# Patient Record
Sex: Female | Born: 1970 | Race: White | Hispanic: No | Marital: Married | Smoking: Former smoker
Health system: Southern US, Community
[De-identification: ages and names within clinical notes are randomized; demographics above are authoritative.]

## PROBLEM LIST (undated history)

## (undated) DIAGNOSIS — F32A Depression, unspecified: Secondary | ICD-10-CM

## (undated) DIAGNOSIS — J449 Chronic obstructive pulmonary disease, unspecified: Secondary | ICD-10-CM

## (undated) DIAGNOSIS — K219 Gastro-esophageal reflux disease without esophagitis: Secondary | ICD-10-CM

## (undated) DIAGNOSIS — J45909 Unspecified asthma, uncomplicated: Secondary | ICD-10-CM

## (undated) DIAGNOSIS — F319 Bipolar disorder, unspecified: Secondary | ICD-10-CM

## (undated) DIAGNOSIS — Z8711 Personal history of peptic ulcer disease: Secondary | ICD-10-CM

## (undated) DIAGNOSIS — K589 Irritable bowel syndrome without diarrhea: Secondary | ICD-10-CM

## (undated) DIAGNOSIS — G8929 Other chronic pain: Secondary | ICD-10-CM

## (undated) DIAGNOSIS — F410 Panic disorder [episodic paroxysmal anxiety] without agoraphobia: Secondary | ICD-10-CM

## (undated) DIAGNOSIS — I252 Old myocardial infarction: Secondary | ICD-10-CM

## (undated) DIAGNOSIS — I219 Acute myocardial infarction, unspecified: Secondary | ICD-10-CM

## (undated) DIAGNOSIS — F329 Major depressive disorder, single episode, unspecified: Secondary | ICD-10-CM

## (undated) HISTORY — PX: ABDOMINAL HYSTERECTOMY: SHX81

## (undated) HISTORY — DX: Personal history of peptic ulcer disease: Z87.11

## (undated) HISTORY — DX: Irritable bowel syndrome, unspecified: K58.9

---

## 2006-03-28 ENCOUNTER — Emergency Department: Payer: Self-pay | Admitting: Emergency Medicine

## 2009-09-09 ENCOUNTER — Ambulatory Visit: Payer: Self-pay | Admitting: Unknown Physician Specialty

## 2009-10-28 ENCOUNTER — Ambulatory Visit: Payer: Self-pay | Admitting: Unknown Physician Specialty

## 2009-11-04 ENCOUNTER — Ambulatory Visit: Payer: Self-pay | Admitting: Unknown Physician Specialty

## 2009-11-10 ENCOUNTER — Ambulatory Visit: Payer: Self-pay | Admitting: Unknown Physician Specialty

## 2009-11-12 LAB — PATHOLOGY REPORT

## 2010-02-08 ENCOUNTER — Observation Stay: Payer: Self-pay | Admitting: Internal Medicine

## 2010-02-08 DIAGNOSIS — R011 Cardiac murmur, unspecified: Secondary | ICD-10-CM

## 2010-07-04 ENCOUNTER — Emergency Department: Payer: Self-pay | Admitting: Emergency Medicine

## 2011-03-31 ENCOUNTER — Emergency Department: Payer: Self-pay | Admitting: Emergency Medicine

## 2011-04-01 ENCOUNTER — Inpatient Hospital Stay: Payer: Self-pay | Admitting: Internal Medicine

## 2011-04-01 LAB — URINALYSIS, COMPLETE
Bilirubin,UR: NEGATIVE
Ketone: NEGATIVE
Nitrite: NEGATIVE
Ph: 6 (ref 4.5–8.0)
Protein: NEGATIVE
RBC,UR: 1 /HPF (ref 0–5)
Specific Gravity: 1.002 (ref 1.003–1.030)
Squamous Epithelial: 1
WBC UR: <1 /HPF (ref 0–5)

## 2011-04-01 LAB — CBC WITH DIFFERENTIAL/PLATELET
Basophil %: 0.2 %
Eosinophil #: 0 10*3/uL (ref 0.0–0.7)
Eosinophil %: 0.1 %
HGB: 12 g/dL (ref 12.0–16.0)
Lymphocyte %: 22.9 %
MCV: 105 fL — ABNORMAL HIGH (ref 80–100)
Monocyte #: 0.8 10*3/uL — ABNORMAL HIGH (ref 0.0–0.7)
Monocyte %: 7.4 %
Neutrophil #: 7.8 10*3/uL — ABNORMAL HIGH (ref 1.4–6.5)
Neutrophil %: 69.4 %
RDW: 14 % (ref 11.5–14.5)
WBC: 11.3 10*3/uL — ABNORMAL HIGH (ref 3.6–11.0)

## 2011-04-01 LAB — TROPONIN I: Troponin-I: 0.5 ng/mL — ABNORMAL HIGH

## 2011-04-01 LAB — COMPREHENSIVE METABOLIC PANEL
Albumin: 2.8 g/dL — ABNORMAL LOW (ref 3.4–5.0)
Anion Gap: 7 (ref 7–16)
Bilirubin,Total: 0.8 mg/dL (ref 0.2–1.0)
Chloride: 104 mmol/L (ref 98–107)
EGFR (Non-African Amer.): >60
Glucose: 103 mg/dL — ABNORMAL HIGH (ref 65–99)
Potassium: 3.5 mmol/L (ref 3.5–5.1)
SGPT (ALT): 12 U/L
Sodium: 141 mmol/L (ref 136–145)
Total Protein: 6.1 g/dL — ABNORMAL LOW (ref 6.4–8.2)

## 2011-04-02 LAB — CBC WITH DIFFERENTIAL/PLATELET
Eosinophil %: 0.7 %
Lymphocyte #: 2.4 10*3/uL (ref 1.0–3.6)
Lymphocyte %: 34.1 %
Monocyte #: 0.6 10*3/uL (ref 0.0–0.7)
Monocyte %: 7.8 %
Neutrophil #: 4.1 10*3/uL (ref 1.4–6.5)
Neutrophil %: 57.1 %
Platelet: 240 10*3/uL (ref 150–440)
RDW: 14.4 % (ref 11.5–14.5)
WBC: 7.1 10*3/uL (ref 3.6–11.0)

## 2011-04-02 LAB — LIPID PANEL
Cholesterol: 173 mg/dL (ref 0–200)
HDL Cholesterol: 19 mg/dL — ABNORMAL LOW (ref 40–60)
Ldl Cholesterol, Calc: 125 mg/dL — ABNORMAL HIGH (ref 0–100)
VLDL Cholesterol, Calc: 29 mg/dL (ref 5–40)

## 2011-04-02 LAB — TROPONIN I
Troponin-I: 0.3 ng/mL — ABNORMAL HIGH
Troponin-I: 0.22 ng/mL — ABNORMAL HIGH

## 2011-04-02 LAB — FOLATE: Folic Acid: 3.7 ng/mL (ref 3.1–100.0)

## 2011-04-02 LAB — CK TOTAL AND CKMB (NOT AT ARMC)
CK, Total: 579 U/L — ABNORMAL HIGH (ref 21–215)
CK, Total: 577 U/L — ABNORMAL HIGH (ref 21–215)
CK-MB: 2.2 ng/mL (ref 0.5–3.6)

## 2011-04-02 LAB — DRUG SCREEN, URINE
Amphetamines, Ur Screen: NEGATIVE (ref ?–1000)
Barbiturates, Ur Screen: NEGATIVE (ref ?–200)
Benzodiazepine, Ur Scrn: POSITIVE (ref ?–200)
Cocaine Metabolite,Ur ~~LOC~~: NEGATIVE (ref ?–300)
Opiate, Ur Screen: POSITIVE (ref ?–300)
Phencyclidine (PCP) Ur S: NEGATIVE (ref ?–25)

## 2011-04-02 LAB — SEDIMENTATION RATE: Erythrocyte Sed Rate: 30 mm/hr — ABNORMAL HIGH (ref 0–20)

## 2011-04-02 LAB — TSH: Thyroid Stimulating Horm: 0.704 u[IU]/mL

## 2011-04-02 LAB — HEMOGLOBIN A1C: Hemoglobin A1C: 5.4 % (ref 4.2–6.3)

## 2011-04-03 LAB — BASIC METABOLIC PANEL
Anion Gap: 8 (ref 7–16)
BUN: 4 mg/dL — ABNORMAL LOW (ref 7–18)
Calcium, Total: 8 mg/dL — ABNORMAL LOW (ref 8.5–10.1)
Chloride: 109 mmol/L — ABNORMAL HIGH (ref 98–107)
Co2: 25 mmol/L (ref 21–32)
EGFR (African American): >60
Glucose: 141 mg/dL — ABNORMAL HIGH (ref 65–99)
Osmolality: 282 (ref 275–301)
Potassium: 3.3 mmol/L — ABNORMAL LOW (ref 3.5–5.1)
Sodium: 142 mmol/L (ref 136–145)

## 2011-04-03 LAB — CBC WITH DIFFERENTIAL/PLATELET
Basophil #: 0 10*3/uL (ref 0.0–0.1)
Eosinophil #: 0 10*3/uL (ref 0.0–0.7)
HCT: 31.3 % — ABNORMAL LOW (ref 35.0–47.0)
Lymphocyte %: 11.5 %
MCH: 34.8 pg — ABNORMAL HIGH (ref 26.0–34.0)
MCHC: 33.3 g/dL (ref 32.0–36.0)
Monocyte #: 0.1 10*3/uL (ref 0.0–0.7)
Neutrophil #: 6.4 10*3/uL (ref 1.4–6.5)
Neutrophil %: 86.3 %
WBC: 7.4 10*3/uL (ref 3.6–11.0)

## 2012-01-12 ENCOUNTER — Ambulatory Visit: Payer: Self-pay | Admitting: Pain Medicine

## 2012-08-19 ENCOUNTER — Emergency Department: Payer: Self-pay | Admitting: Emergency Medicine

## 2012-08-19 LAB — COMPREHENSIVE METABOLIC PANEL
Albumin: 3.2 g/dL — ABNORMAL LOW (ref 3.4–5.0)
Alkaline Phosphatase: 77 U/L (ref 50–136)
Anion Gap: 4 — ABNORMAL LOW (ref 7–16)
BUN: 5 mg/dL — ABNORMAL LOW (ref 7–18)
Bilirubin,Total: 0.8 mg/dL (ref 0.2–1.0)
Calcium, Total: 8.7 mg/dL (ref 8.5–10.1)
Chloride: 103 mmol/L (ref 98–107)
Creatinine: 0.65 mg/dL (ref 0.60–1.30)
EGFR (African American): >60
Osmolality: 271 (ref 275–301)
Potassium: 4.3 mmol/L (ref 3.5–5.1)
SGPT (ALT): 15 U/L (ref 12–78)

## 2012-08-19 LAB — URINALYSIS, COMPLETE
Nitrite: NEGATIVE
Specific Gravity: 1.01 (ref 1.003–1.030)

## 2012-08-19 LAB — CBC
HCT: 39.6 % (ref 35.0–47.0)
HGB: 13.6 g/dL (ref 12.0–16.0)
MCHC: 34.3 g/dL (ref 32.0–36.0)
MCV: 100 fL (ref 80–100)
Platelet: 270 10*3/uL (ref 150–440)
RBC: 3.96 10*6/uL (ref 3.80–5.20)

## 2013-02-18 ENCOUNTER — Emergency Department: Payer: Self-pay | Admitting: Emergency Medicine

## 2013-06-24 DIAGNOSIS — R079 Chest pain, unspecified: Secondary | ICD-10-CM | POA: Insufficient documentation

## 2014-04-26 NOTE — Consult Note (Signed)
PATIENT NAME:  Debbie Gay, Debbie Gay MR#:  [id number] DATE OF BIRTH:  [birth year]  DATE OF CONSULTATION:  04/02/2011  REFERRING PHYSICIAN:  Dr. Margie EgePhichith CONSULTING PHYSICIAN:  Lamar BlinksBruce J. Kowalski, MD  REASON FOR CONSULTATION: Chest pain with elevated troponin.   HISTORY OF PRESENT ILLNESS: This is a 44 year old female with new onset of bronchitis-type symptoms presenting with bilateral hip pain, swelling, confusion, and history of intermittent chest discomfort in the middle of her chest radiating through her arms, [facility] treatment of nitroglycerin. The patient has had intermittent episodes of this with and without physical activity. She presents now with some shortness of breath as well. CT scan shows emphysema and atelectasis with mild elevation of BNP as well. She has had some transient confusion and lethargy with difficulty in sleeping in the last many days. She has had some diffuse joint pain as well. She is a smoker for which we have counseled her on the discontinuation of tobacco abuse. EKG shows normal sinus rhythm with nonspecific ST changes and she does have a minimal elevation of troponin. The patient's elevation of troponin appears to be more demand ischemia rather than acute myocardial infarction.   REVIEW OF SYSTEMS: The remainder of review of systems is negative for vision change, ringing in the ears, hearing loss, cough, congestion, heartburn, nausea, vomiting, diarrhea, bloody stools, stomach pain, extremity pain, leg weakness, cramping of the buttocks, known blood clots, headaches, blackouts, dizzy spells, nosebleed, congestion, trouble swallowing, frequent urination, urination at night, muscle weakness, numbness, anxiety, depression, skin lesions, or skin rashes.   PAST MEDICAL HISTORY:  1. Arthralgias.  2. Chronic obstructive pulmonary disease.   FAMILY HISTORY: No family members with early onset of cardiovascular disease.   SOCIAL HISTORY: The  patient smokes and drinks.   ALLERGIES: No known drug allergies.   CURRENT MEDICATIONS: As listed.   PHYSICAL EXAMINATION:  VITAL SIGNS: Blood pressure 126/68 bilaterally, heart rate 72 upright, reclining, and regular.   GENERAL: She is well-appearing female in no acute distress.   HEENT: No icterus, thyromegaly, ulcers, hemorrhage, or xanthelasma.   HEART: Regular rate and rhythm. Normal S1 and S2 without murmur, gallop, or rub. Point of maximal impulse is normal size and placement. Carotid upstroke normal without bruit. Jugular venous pressure is normal.   LUNGS: Lungs have few basilar crackles with normal respirations.   ABDOMEN: Soft, nontender, without hepatosplenomegaly or masses. Abdominal aorta is normal size without bruit.   EXTREMITIES: 2+ bilateral pulses in dorsal, pedal, radial, and femoral arteries without lower extremity edema, cyanosis, clubbing, or ulcers.   NEUROLOGIC: She is oriented to time, place, and person with normal mood and affect.   ASSESSMENT: This is a 44 year old female with atypical chest pain, weakness, and fatigue with elevated troponin and slightly abnormal EKG without evidence of acute myocardial infarction needing further medical management and treatment options.   RECOMMENDATIONS:  1. Continue serial ECG and enzymes to assess for possible myocardial infarction.  2. No further cardiac intervention at this time due to no evidence of true myocardial infarction. Further treatment of generalized weakness, confusion, and other medical problems. 3. Consideration of in either in or outpatient stress test after above.    ____________________________ Lamar BlinksBruce J. Kowalski, MD bjk:bjt D: 04/02/2011 07:44:56 ET T: 04/02/2011 12:03:24 ET JOB#: [id number]  Lamar BlinksBRUCE J KOWALSKI MD ELECTRONICALLY SIGNED 04/04/2011 8:05

## 2014-04-26 NOTE — H&P (Signed)
PATIENT NAME:  Debbie Gay, Debbie Gay MR#:  [id number] DATE OF BIRTH:  March 27, 1970  DATE OF ADMISSION:  04/01/2011  REFERRING PHYSICIAN:  Dr. Cinda Quest  PRIMARY CARE PHYSICIAN: Dr. Valere Dross, [place]   PRESENTING COMPLAINT: Worsening hip pain, left greater than right with bruising, nontraumatic, and new onset of sacral pain with reports of rash and confusion and lethargy.   HISTORY OF PRESENT ILLNESS: Debbie Gay is a 44 year old woman who has history of chronic bronchitis, ongoing tobacco abuse, and likely chronic obstructive pulmonary disease, anxiety, gastroesophageal reflux disease, B12 deficiency, and atypical chest pain who represents after being evaluated yesterday with complaints of left hip pain worse from baseline. The patient was discharged home with recommendations to follow up with the pain clinic. She returns today with reports of waking up from her sleep again with severe pain in her hips, left greater than right, as well as now pain in her sacrum with swelling. Denies any falls. Reports that intermittently gets bilateral knee pain and swelling as well. She has various locations of arthralgias, reports that she developed a facial rash yesterday which has now resolved. Also reports earlier this evening before presenting to the Emergency Room her boyfriend was not able to arouse her. She was confused. She did not recollect the episode. Her boyfriend reports fever as well. The patient denies chest pain currently. Reports that she does have a history of chest pain, last episode was two months ago. Reports that it usually occurs with stressors or poor sleep. She endorses some mild shortness of breath, but no worse from her baseline. She also endorses for the past one year she has lost approximately 20 pounds, denies any dysphagia. She endorses presyncope but no syncope. No palpitations. Reports she has intermittent lower extremity swelling which currently is not an issue.   PAST MEDICAL HISTORY:  1. Admitted  in February 2012 for management of electrolyte abnormality with hypokalemia and hypomagnesemia and edema, thought to be related to steroids.  2. Vitamin D deficiency.  3. Tobacco use.  4. Chronic bronchitis.  5. Anxiety.  6. Gastroesophageal reflux disease.  7. History of microscopic hematuria.  8. B12 deficiency.  9. Atypical chest pain.   PAST SURGICAL HISTORY:  1. Hysterectomy.  2. LEEP procedure.  3. Tubal ligation.  4. Oral surgery.   ALLERGIES: Codeine, Levaquin, and penicillin.   MEDICATIONS:  1. Nitroglycerin sublingual as needed.  2. Clonazepam 2 mg daily.  3. Omeprazole 20 mg daily.  4. Reports taking a motion sickness medication over-the-counter.   SOCIAL HISTORY: She continues to smoke 2 packs per day. Denies any alcohol or drug use. She lives in [place].   FAMILY HISTORY: Mother with chronic obstructive pulmonary disease. Grandmother and grandfather both had congestive heart failure. Also history of lung cancer and diabetes in her family.     REVIEW OF SYSTEMS: CONSTITUTIONAL:  Reports boyfriend found her febrile earlier this evening. No nausea or vomiting. EYES: No glaucoma or cataracts. ENT: No dysphagia or epistaxis. RESPIRATORY: Reports mild shortness of breath. She has had some intermittent productive cough. No wheezing. CARDIOVASCULAR: Reports intermittent chest pain related to stress and poor sleep. No orthopnea. Reports intermittent edema. No palpitations or syncope. GI: No nausea, vomiting, diarrhea, abdominal pain, or hematemesis. GU: No dysuria or hematuria. ENDO: No polyuria or polydipsia. Heme: She reports bruising as per history of present illness. SKIN: No ulcers. She reports facial rash which has now resolved. MUSCULOSKELETAL: As per history of present illness with bilateral hip pain and  intermittent arthralgias in various locations. PSYCH: Denies any suicidal ideation.   PHYSICAL EXAMINATION:  VITAL SIGNS: Temperature is 97.8, pulse 94, respiratory  rate 18, blood pressure 93/65, saturating at 96% on room air.   GENERAL: Lying in bed, weak appearing, in no apparent distress.   HEENT: Normocephalic, atraumatic. Pupils equal, symmetric. Anicteric. Nares without discharge. Slightly dry mucous membranes.   NECK: Soft and supple. No adenopathy or JVP.   CARDIOVASCULAR: Non-tachy. No murmurs, rubs, or gallops.   LUNGS: Basilar crackles. No use of accessory muscles or increased respiratory effort.   ABDOMEN: Soft. Positive bowel sounds. No mass appreciated.   EXTREMITIES: Difficulty with manipulating her legs due to the pain produced in her hip with movement. There is 1+ pitting edema of the left side. Dorsal pedis pulses intact. No joint effusion.  Her sacrum and left buttock area has reproducible pain with noted swelling over her sacrum.   NEUROLOGIC: Symmetrical strength of upper extremities. No focal deficits.   PSYCH: She is alert and oriented. The patient is cooperative.   LABORATORY, DIAGNOSTIC, AND RADIOLOGICAL DATA: CT chest, abdomen, and pelvis with contrast is revealing for bilateral lower lobe pneumonia versus atelectasis. There are emphysematous changes in the mid and upper lung zones with some bullous regions. No  acute abnormality found. There is some minimal fluid that may be present in the area superficial to the spinous muscle on the left. Urinalysis with specific gravity of 1.002, blood 2+, pH 6, negative nitrite, negative leukocyte esterase, RBC one per high-power field, WBC less than one per high-power field. WBC 11.3, hemoglobin 12, hematocrit 36.5, platelets 308, MCV 105, glucose 103, BUN 7, creatinine 0.72, sodium 141, potassium 3.5, chloride 104, carbon dioxide 30, calcium 7.8, total bilirubin 0.8, alkaline phosphatase 69, ALT 12, AST 38, total protein 6.1, albumin 2.8. Troponin 0.5, BNP 2378. EKG with sinus rate of 85. No ST elevation or depression. There is low voltage.   ASSESSMENT AND PLAN: Debbie Gay is a 44 year old  woman with history of tobacco use, chronic bronchitis, anxiety, B12 deficiency, and vitamin D deficiency presenting with bilateral hip pain and coccyx pain and swelling, rash, confusion, lethargy, and intermittent chest pain, found to have elevated troponin. 1. Elevated troponin: Reports atypical chest pain in the past as per history of present illness, last episode two months ago. She has been apparently evaluated at [facility] and initiated on nitroglycerin sublingual as needed. Unclear of work-up that was initiated. We will continue the patient on telemetry and cycle cardiac enzymes. Continue oxygen. CT chest revealing for emphysema and likely not pneumonia and more so consistent with atelectasis. Her BNP is mildly elevated with no evidence of decompensation. Her echo from February 2012 showed ejection fraction of 50 to 55%. Start patient on aspirin. Given her relative hypotension, hold off on beta blocker. We will obtain cardiology consultation for further recommendations.  2. Nonspecific arthralgia and edema with nontraumatic episode, unclear etiology: CT scan results as dictated above. We will send ESR, CRP, TSH, ANA, rheumatoid factor, and HIV. We will also obtain PT evaluation.  3. Transient confusion and lethargy: Questionably related to lack of sleep secondary to her pain. Continue to follow and laboratory work-up as above.  4. Relative hypotension: She was last admitted in February 2012 with some abnormal studies involving her ACTH, cortisol, and aldosterone. Questionable with some elements of adrenal insufficiency. We will repeat these values. We will also obtain orthostatics.  5. Vitamin D deficiency, not on supplementation: Will repeat levels.  6. Macrocytosis  with history of B12 deficiency: We will send folate and B12.  7. Prophylaxis with omeprazole, Lovenox, and aspirin.   TIME SPENT: Approximately 55 minutes spent on patient care.     ____________________________ Rita Ohara,  MD ap:bjt D: 04/01/2011 23:58:09 ET T: 04/02/2011 09:37:40 ET JOB#: [id number]  cc: Brien Few Xareni Kelch, MD, <Dictator> Dr. Valere Dross, [place] [facility] MD ELECTRONICALLY SIGNED 04/05/2011 22:21

## 2014-04-26 NOTE — Discharge Summary (Signed)
PATIENT NAME:  Debbie Gay, Debbie Gay MR#:  [id number] DATE OF BIRTH:  1970/07/29  DATE OF ADMISSION:  04/01/2011 DATE OF DISCHARGE:  04/03/2011  ADMITTING DIAGNOSES: Hip pain, sacral pain, confusion, and lethargy.   DISCHARGE DIAGNOSES:  1. Hip pain and sacral pain of unclear etiology. The patient's CT scan of the pelvis showed nonspecific small amount of fluid collection in the sacral area. Now symptoms are improved. Before the patient could be thoroughly evaluated, she wanted to go home.  2. Elevated troponin status post evaluation by cardiology. They felt that this was noncardiac. The patient had no cardiac symptoms.  3. Elevated B-type natriuretic peptide. Echocardiogram from February 2012 showed an ejection fraction of 50 to 55%.  4. Transient confusion and lethargy possibly related to lack of sleep, now resolved.  5. Hypotension. Blood pressure improved with IV hydration. The patient does have a history of having abnormal cortisol level during her hospitalization in February 2012. Cortisol level has been drawn, which is currently pending. The patient will need to follow-up with primary physician.  6. Vitamin D deficiency. She was not on any supplement and was started on supplements.  7. Chronic bronchitis.  8. Anxiety.  9. Tobacco abuse.  10. Gastroesophageal reflux disease.  11. History of microscopic hematuria.  12. History of B12 deficiency.  13. History of atypical chest pain.  14. Status post hysterectomy.  15. Status post loop electrosurgical excision procedure.  16. Status post tubal ligation.  17. Status post oral surgery. 18. Decreased LDL. The patient is to stop smoking.  PERTINENT LABS AND EVALUATIONS: CT scan of the chest, abdomen and pelvis with contrast reveals bilateral lower lobe pneumonia versus atelectasis. There are emphysematous changes in the mid lung with some bullous changes. No acute abnormality noted. Minimal fluid may be present in the area superficial to the  spinous muscle on the left.   Urinalysis showed 2+ blood, nitrites negative, and negative leukocytes.   WBC 11.2, hemoglobin 12, and hematocrit 36.5. LFTs: ALT 12, AST 38, total protein 6.1, and albumin 2.8. Troponin 0.5.   EKG: Sinus rhythm of 85. No ST-T wave changes.   BNP 2378. Magnesium level 1.8. Cholesterol 173, triglycerides 144, and HDL 19. Hemoglobin A1c 5.4. Sedimentation rate 30. Her subsequent troponin was 0.30 and 0.22. TSH was 0.705. TUDs were positive for benzodiazepines and tricyclics. The rest of her blood work is currently pending.   HOSPITAL COURSE: Please see the history and physical done by the admitting physician. The patient is a 44 year old white female with history of chronic bronchitis, ongoing tobacco abuse, likely chronic obstructive pulmonary, anxiety, depression, B12 deficiency, and vitamin D deficiency who presented to the ED waking up from sleep again with severe pain in her hips, left greater than right, as well as pain in her sacrum. Due to these symptoms, the patient came to the ED, and she was evaluated. She had a CT scan of the abdomen and pelvis which showed some nonspecific fluid in the sacral region. The patient also was noted to have an elevated troponin as well. Due to these symptoms, she was admitted to the hospital.  1. Nonspecific arthralgias with some inflammatory changes: The patient was placed on some steroids to help with that and pain medications. With these treatments, her pain is improved. She has chronic pain involving the right leg. Currently ANA and rheumatoid factor are currently pending. The patient wishes to go home and does not want to wait for these results.  2. Elevated troponin without any  EKG changes: The patient currently has no chest pain. She was seen by cardiology. They recommended no further treatment and felt that her troponin elevation was due to her other issues.   At this time, the patient requests that she be discharged and does  not wish to stay further in the hospital. She states that she will follow-up with her primary MD for further evaluation and treatment. I will give her my contact information to call back tomorrow to get the results that are currently pending.      DISCHARGE MEDICATIONS:  1. Estradiol as previously. 2. Clonazepam 2 mg daily.  3. Prilosec 40 mg daily.  4. Nitrostat sublingual as needed.  5. Tramadol 50 mg two to three tabs twice a day to three times daily as needed.  6. Dramamine 50 mg two to three tabs orally daily.  7. Percocet 5/325 mg p.o. every 6 hours p.r.n. pain.  8. Vitamin D 2000 international units p.o. daily. 9. Multivitamin p.o. daily.   DIET: Regular.   ACTIVITY: As tolerated.   TIMEFRAME FOR FOLLOW-UP: Followup in 1 to 2 weeks with primary MD, Dr. Dayton ScrapeMurray. [facility]. The patient has multiple joint pains.  TIME SPENT: 35 minutes spent.  ____________________________ Lacie ScottsShreyang H. Allena KatzPatel, MD shp:slb D: 04/03/2011 09:25:49 ET T: 04/04/2011 11:32:56 ET JOB#: [id number]  cc: Jya Hughston H. Allena KatzPatel, MD, <Dictator> Dr. Sheffield SliderMurray Auriah Hollings H Jameon Deller MD ELECTRONICALLY SIGNED 04/07/2011 12:15

## 2014-10-16 ENCOUNTER — Encounter: Payer: Self-pay | Admitting: *Deleted

## 2014-10-16 ENCOUNTER — Other Ambulatory Visit: Payer: Self-pay

## 2014-10-16 ENCOUNTER — Emergency Department
Admission: EM | Admit: 2014-10-16 | Discharge: 2014-10-16 | Disposition: A | Discharge status: Home / Self Care | Payer: Medicaid Other | Attending: Emergency Medicine | Admitting: Emergency Medicine

## 2014-10-16 ENCOUNTER — Emergency Department: Payer: Medicaid Other

## 2014-10-16 DIAGNOSIS — Z720 Tobacco use: Secondary | ICD-10-CM | POA: Diagnosis not present

## 2014-10-16 DIAGNOSIS — K2970 Gastritis, unspecified, without bleeding: Secondary | ICD-10-CM | POA: Insufficient documentation

## 2014-10-16 DIAGNOSIS — F1110 Opioid abuse, uncomplicated: Secondary | ICD-10-CM | POA: Diagnosis not present

## 2014-10-16 DIAGNOSIS — F1310 Sedative, hypnotic or anxiolytic abuse, uncomplicated: Secondary | ICD-10-CM | POA: Diagnosis not present

## 2014-10-16 DIAGNOSIS — R1032 Left lower quadrant pain: Secondary | ICD-10-CM | POA: Diagnosis present

## 2014-10-16 HISTORY — DX: Old myocardial infarction: I25.2

## 2014-10-16 HISTORY — DX: Gastro-esophageal reflux disease without esophagitis: K21.9

## 2014-10-16 HISTORY — DX: Bipolar disorder, unspecified: F31.9

## 2014-10-16 HISTORY — DX: Panic disorder (episodic paroxysmal anxiety): F41.0

## 2014-10-16 HISTORY — DX: Chronic obstructive pulmonary disease, unspecified: J44.9

## 2014-10-16 LAB — URINE DRUG SCREEN, QUALITATIVE (ARMC ONLY)
Amphetamines, Ur Screen: NOT DETECTED
BARBITURATES, UR SCREEN: NOT DETECTED
BENZODIAZEPINE, UR SCRN: POSITIVE — AB
CANNABINOID 50 NG, UR ~~LOC~~: NOT DETECTED
COCAINE METABOLITE, UR ~~LOC~~: NOT DETECTED
MDMA (Ecstasy)Ur Screen: NOT DETECTED
Methadone Scn, Ur: NOT DETECTED
OPIATE, UR SCREEN: POSITIVE — AB
Phencyclidine (PCP) Ur S: NOT DETECTED
TRICYCLIC, UR SCREEN: NOT DETECTED

## 2014-10-16 LAB — COMPREHENSIVE METABOLIC PANEL
ALT: 10 U/L — ABNORMAL LOW (ref 14–54)
ANION GAP: 5 (ref 5–15)
AST: 24 U/L (ref 15–41)
Albumin: 3.4 g/dL — ABNORMAL LOW (ref 3.5–5.0)
Alkaline Phosphatase: 71 U/L (ref 38–126)
BILIRUBIN TOTAL: 0.3 mg/dL (ref 0.3–1.2)
BUN: 17 mg/dL (ref 6–20)
CHLORIDE: 104 mmol/L (ref 101–111)
CO2: 29 mmol/L (ref 22–32)
Calcium: 8.5 mg/dL — ABNORMAL LOW (ref 8.9–10.3)
Creatinine, Ser: 0.72 mg/dL (ref 0.44–1.00)
Glucose, Bld: 102 mg/dL — ABNORMAL HIGH (ref 65–99)
POTASSIUM: 3.9 mmol/L (ref 3.5–5.1)
Sodium: 138 mmol/L (ref 135–145)
TOTAL PROTEIN: 6.6 g/dL (ref 6.5–8.1)

## 2014-10-16 LAB — URINALYSIS COMPLETE WITH MICROSCOPIC (ARMC ONLY)
BACTERIA UA: NONE SEEN
Bilirubin Urine: NEGATIVE
GLUCOSE, UA: NEGATIVE mg/dL
Ketones, ur: NEGATIVE mg/dL
LEUKOCYTES UA: NEGATIVE
NITRITE: NEGATIVE
PH: 6 (ref 5.0–8.0)
Protein, ur: NEGATIVE mg/dL
SPECIFIC GRAVITY, URINE: 1.011 (ref 1.005–1.030)
SQUAMOUS EPITHELIAL / LPF: NONE SEEN

## 2014-10-16 LAB — CBC
HEMATOCRIT: 35.6 % (ref 35.0–47.0)
HEMOGLOBIN: 11.7 g/dL — AB (ref 12.0–16.0)
MCH: 29.8 pg (ref 26.0–34.0)
MCHC: 32.9 g/dL (ref 32.0–36.0)
MCV: 90.6 fL (ref 80.0–100.0)
Platelets: 296 10*3/uL (ref 150–440)
RBC: 3.93 MIL/uL (ref 3.80–5.20)
RDW: 15.8 % — AB (ref 11.5–14.5)
WBC: 7.2 10*3/uL (ref 3.6–11.0)

## 2014-10-16 LAB — LIPASE, BLOOD: LIPASE: 26 U/L (ref 22–51)

## 2014-10-16 MED ORDER — ONDANSETRON HCL 4 MG/2ML IJ SOLN
INTRAMUSCULAR | Status: AC
  Filled 2014-10-16: qty 2

## 2014-10-16 MED ORDER — MORPHINE SULFATE (PF) 4 MG/ML IV SOLN
INTRAVENOUS | Status: AC
  Filled 2014-10-16: qty 1

## 2014-10-16 MED ORDER — PANTOPRAZOLE SODIUM 40 MG PO TBEC: 40.0000 mg | DELAYED_RELEASE_TABLET | Freq: Every day | ORAL | Status: DC

## 2014-10-16 MED ORDER — MORPHINE SULFATE (PF) 4 MG/ML IV SOLN
4.0000 mg | Freq: Once | INTRAVENOUS | Status: AC
  Administered 2014-10-16: 4 mg via INTRAVENOUS

## 2014-10-16 MED ORDER — ONDANSETRON HCL 4 MG/2ML IJ SOLN
4.0000 mg | Freq: Once | INTRAMUSCULAR | Status: AC
  Administered 2014-10-16: 4 mg via INTRAVENOUS

## 2014-10-16 MED ORDER — OXYCODONE-ACETAMINOPHEN 5-325 MG PO TABS: 1.0000 | ORAL_TABLET | Freq: Four times a day (QID) | ORAL | Status: DC | PRN

## 2014-10-16 MED ORDER — IOHEXOL 300 MG/ML  SOLN
80.0000 mL | Freq: Once | INTRAMUSCULAR | Status: AC | PRN
  Administered 2014-10-16: 80 mL via INTRAVENOUS

## 2014-10-16 MED ORDER — IOHEXOL 240 MG/ML SOLN
25.0000 mL | INTRAMUSCULAR | Status: AC
  Administered 2014-10-16 (×2): 25 mL via ORAL

## 2014-10-16 NOTE — ED Notes (Addendum)
Pt states she has had left mid abdominal pain that goes through the left upper abdomen associated with diarrhea for "3 or 4 months". Pain and diarrhea "worse" over the 3days. Pt is scheduled for EGD on the 28th to evaluate the same.

## 2014-10-16 NOTE — ED Notes (Signed)
Pt resting in bed in no acute distress, CT contrast bottles given to pt

## 2014-10-16 NOTE — ED Notes (Signed)
Pt returned from CT scan, pt resting in bed in no acute distress

## 2014-10-16 NOTE — ED Provider Notes (Signed)
[facility] Emergency Department Provider Note  ____________________________________________  Time seen: 6:15 AM  I have reviewed the triage vital signs and the nursing notes.   HISTORY  Chief Complaint Abdominal Pain      HPI Debbie Gay is a 44 y.o. female presents with history of chronic diarrhea 3-4 months which has acutely worsened in the past 3 days accompanied/left lower quadrant abdominal pain. Patient states current pain score is 7 out of 10. Patient denies any recent antibiotic use.     Past Medical History  Diagnosis Date  . COPD (chronic obstructive pulmonary disease) (HCC)   . Panic attacks   . Bipolar 1 disorder (HCC)   . History of heart attack   . GERD (gastroesophageal reflux disease)     There are no active problems to display for this patient.   Past Surgical History  Procedure Laterality Date  . Abdominal hysterectomy      No current outpatient prescriptions on file.  Allergies Prednisone and Levaquin  No family history on file.  Social History Social History  Substance Use Topics  . Smoking status: Current Every Day Smoker  . Smokeless tobacco: None  . Alcohol Use: No    Review of Systems  Constitutional: Negative for fever. Eyes: Negative for visual changes. ENT: Negative for sore throat. Cardiovascular: Negative for chest pain. Respiratory: Negative for shortness of breath. Gastrointestinal: Positive for abdominal pain and diarrhea. Genitourinary: Negative for dysuria. Musculoskeletal: Negative for back pain. Skin: Negative for rash. Neurological: Negative for headaches, focal weakness or numbness.   10-point ROS otherwise negative.  ____________________________________________   PHYSICAL EXAM:  VITAL SIGNS: ED Triage Vitals  Enc Vitals Group     BP 10/16/14 0114 124/94 mmHg     Pulse Rate 10/16/14 0114 85     Resp 10/16/14 0114 18     Temp 10/16/14 0114 97.9 F (36.6 C)     Temp  Source 10/16/14 0114 Oral     SpO2 10/16/14 0114 95 %     Weight 10/16/14 0114 93 lb (42.185 kg)     Height 10/16/14 0114  (1.6 m)     Head Cir --      Peak Flow --      Pain Score 10/16/14 0115 10     Pain Loc --      Pain Edu? --      Excl. in GC? --      Constitutional: Alert and oriented. Well appearing and in no distress. Eyes: Conjunctivae are normal. PERRL. Normal extraocular movements. ENT   Head: Normocephalic and atraumatic.   Nose: No congestion/rhinnorhea.   Mouth/Throat: Mucous membranes are moist.   Neck: No stridor. Hematological/Lymphatic/Immunilogical: No cervical lymphadenopathy. Cardiovascular: Normal rate, regular rhythm. Normal and symmetric distal pulses are present in all extremities. No murmurs, rubs, or gallops. Respiratory: Normal respiratory effort without tachypnea nor retractions. Breath sounds are clear and equal bilaterally. No wheezes/rales/rhonchi. Gastrointestinal: Positive left upper quadrant/left lower quadrant pain with palpation No distention. There is no CVA tenderness. Genitourinary: deferred Musculoskeletal: Nontender with normal range of motion in all extremities. No joint effusions.  No lower extremity tenderness nor edema. Neurologic:  Normal speech and language. No gross focal neurologic deficits are appreciated. Speech is normal.  Skin:  Skin is warm, dry and intact. No rash noted. Psychiatric: Mood and affect are normal. Speech and behavior are normal. Patient exhibits appropriate insight and judgment.  ____________________________________________    LABS (pertinent positives/negatives)  Labs Reviewed  COMPREHENSIVE METABOLIC PANEL - Abnormal; Notable for the following:    Glucose, Bld 102 (*)    Calcium 8.5 (*)    Albumin 3.4 (*)    ALT 10 (*)    All other components within normal limits  CBC - Abnormal; Notable for the following:    Hemoglobin 11.7 (*)    RDW 15.8 (*)    All other components within normal  limits  URINALYSIS COMPLETEWITH MICROSCOPIC ([facility] ONLY) - Abnormal; Notable for the following:    Color, Urine STRAW (*)    APPearance CLEAR (*)    Hgb urine dipstick 2+ (*)    All other components within normal limits  C DIFFICILE QUICK SCREEN W PCR REFLEX  LIPASE, BLOOD  URINE DRUG SCREEN, QUALITATIVE ([facility] ONLY)      RADIOLOGY     CT Abdomen Pelvis W Contrast (Final result) Result time: 10/16/14 09:26:26   Final result by Rad Results In Interface (10/16/14 09:26:26)   Narrative:   CLINICAL DATA: Left-sided abdominal pain for approximately 3 months, progressing. Diarrhea.  EXAM: CT ABDOMEN AND PELVIS WITH CONTRAST  TECHNIQUE: Multidetector CT imaging of the abdomen and pelvis was performed using the standard protocol following bolus administration of intravenous contrast. Oral contrast was also administered.  CONTRAST: 80mL OMNIPAQUE IOHEXOL 300 MG/ML SOLN  COMPARISON: April 01, 2011  FINDINGS: Lower chest: There is patchy bibasilar lung atelectatic change. There is no lung base airspace consolidation.  Hepatobiliary: No focal liver lesions are identified. The gallbladder wall is not appreciably thickened. There is no biliary duct dilatation.  Pancreas: No mass or inflammatory focus.  Spleen: No splenic lesions identified.  Adrenals/Urinary Tract: Adrenals appear normal bilaterally. Kidneys bilaterally show no demonstrable mass or hydronephrosis. No intrarenal or ureteral calculi identified on either side. Urinary bladder is midline with normal wall thickness. There are several pelvic phleboliths which are near but separate from the respective distal ureters.  Stomach/Bowel: There is no appreciable small or large bowel wall or mesenteric thickening. There does appear to be localized thickening of the wall of the gastric pylorus. No evidence of diverticulitis. No bowel obstruction. No free air or portal venous air.  Vascular/Lymphatic: There is  atherosclerotic change in the distal aorta and proximal common iliac arteries. No aneurysm appreciable. Major mesenteric arterial vessels appear unremarkable. There is no demonstrable adenopathy in the abdomen or pelvis.  Reproductive: Uterus is absent. No pelvic mass or pelvic fluid collection.  Other: Appendix not seen. No periappendiceal region inflammation. No abscess or ascites in the abdomen or pelvis.  Musculoskeletal: No intramuscular or abdominal wall lesion. No blastic or lytic bone lesions.  IMPRESSION: Localized wall thickening in the region of the gastric pylorus. Question a degree of distal gastritis. No contrast extravasation or fistula seen in this area. No other bowel wall thickening on this study. No bowel obstruction. No diverticulitis.  No renal or ureteral calculus. No hydronephrosis. Urinary bladder wall normal in thickness.  Uterus absent.   Electronically Signed By: Bretta BangWilliam Woodruff III M.D. On: 10/16/2014 09:26        INITIAL IMPRESSION / ASSESSMENT AND PLAN / ED COURSE  Pertinent labs & imaging results that were available during my care of the patient were reviewed by me and considered in my medical decision making (see chart for details).  Patient care transferred to Dr. Lenard LancePaduchowski  ____________________________________________   FINAL CLINICAL IMPRESSION(S) / ED DIAGNOSES  Final diagnoses:  Gastritis      Darci Currentandolph N Kaelynne Christley, MD 10/21/14 [phone]

## 2014-10-16 NOTE — ED Notes (Addendum)
Pt presents to ED with c/o left sided upper and lower abdominal pain, reports "I have had on and off diarrhea for 3-4 months..If these abnormal clinical findings persist, appropriate workup will be completed. The patient understands that follow up is required to elucidate the situation...if I eat, it goes through me." States pain has increased in the past 3 days. Pt alert and oriented x4, no increased work in breathing noted, skin warm and dry. Call bell within reach, family at bedside..Marland Kitchen

## 2014-10-16 NOTE — ED Provider Notes (Signed)
-----------------------------------------   9:43 AM on 10/16/2014 -----------------------------------------  CT most consistent with gastritis. This seems to fit the patient's pain location as well. Labs otherwise within normal limits. I discussed with the patient need to call the number provided for GI medicine to arrange a follow-up appointment as soon as possible, we'll prescribe Protonix which she will take instead of omeprazole, Maalox as needed, and a short course of pain medication. Patient agreeable to plan. I also discussed with the patient to discontinue use of Goody's powders which she uses every day.  Minna AntisKevin Aljean Horiuchi, MD 10/16/14 [phone]

## 2014-10-16 NOTE — Discharge Instructions (Signed)

## 2014-10-16 NOTE — ED Notes (Signed)
Patient transported to CT 

## 2014-10-16 NOTE — ED Notes (Signed)
Pt up to bathroom to attempt to provide stool specimen.

## 2014-10-16 NOTE — ED Notes (Signed)
Pt drinking out of personal mylanta bottle, MD notified

## 2014-10-29 ENCOUNTER — Encounter: Payer: Self-pay | Admitting: *Deleted

## 2014-10-30 ENCOUNTER — Ambulatory Visit
Admission: RE | Admit: 2014-10-30 | Discharge: 2014-10-30 | Disposition: A | Discharge status: Home / Self Care | Payer: Medicaid Other | Source: Ambulatory Visit | Attending: Gastroenterology | Admitting: Gastroenterology

## 2014-10-30 ENCOUNTER — Ambulatory Visit: Payer: Medicaid Other | Admitting: Anesthesiology

## 2014-10-30 ENCOUNTER — Encounter
Admission: RE | Disposition: A | Discharge status: Home / Self Care | Payer: Self-pay | Source: Ambulatory Visit | Attending: Gastroenterology

## 2014-10-30 DIAGNOSIS — Z881 Allergy status to other antibiotic agents status: Secondary | ICD-10-CM | POA: Insufficient documentation

## 2014-10-30 DIAGNOSIS — K589 Irritable bowel syndrome without diarrhea: Secondary | ICD-10-CM | POA: Diagnosis not present

## 2014-10-30 DIAGNOSIS — Z888 Allergy status to other drugs, medicaments and biological substances status: Secondary | ICD-10-CM | POA: Insufficient documentation

## 2014-10-30 DIAGNOSIS — Z880 Allergy status to penicillin: Secondary | ICD-10-CM | POA: Diagnosis not present

## 2014-10-30 DIAGNOSIS — Z79899 Other long term (current) drug therapy: Secondary | ICD-10-CM | POA: Diagnosis not present

## 2014-10-30 DIAGNOSIS — K5900 Constipation, unspecified: Secondary | ICD-10-CM | POA: Diagnosis present

## 2014-10-30 DIAGNOSIS — K2950 Unspecified chronic gastritis without bleeding: Secondary | ICD-10-CM | POA: Diagnosis not present

## 2014-10-30 DIAGNOSIS — F410 Panic disorder [episodic paroxysmal anxiety] without agoraphobia: Secondary | ICD-10-CM | POA: Diagnosis not present

## 2014-10-30 DIAGNOSIS — F319 Bipolar disorder, unspecified: Secondary | ICD-10-CM | POA: Insufficient documentation

## 2014-10-30 DIAGNOSIS — F419 Anxiety disorder, unspecified: Secondary | ICD-10-CM | POA: Diagnosis not present

## 2014-10-30 DIAGNOSIS — B9681 Helicobacter pylori [H. pylori] as the cause of diseases classified elsewhere: Secondary | ICD-10-CM | POA: Insufficient documentation

## 2014-10-30 DIAGNOSIS — K219 Gastro-esophageal reflux disease without esophagitis: Secondary | ICD-10-CM | POA: Diagnosis not present

## 2014-10-30 DIAGNOSIS — M549 Dorsalgia, unspecified: Secondary | ICD-10-CM | POA: Insufficient documentation

## 2014-10-30 DIAGNOSIS — K644 Residual hemorrhoidal skin tags: Secondary | ICD-10-CM | POA: Diagnosis not present

## 2014-10-30 DIAGNOSIS — R1013 Epigastric pain: Secondary | ICD-10-CM | POA: Diagnosis present

## 2014-10-30 DIAGNOSIS — K621 Rectal polyp: Secondary | ICD-10-CM | POA: Insufficient documentation

## 2014-10-30 DIAGNOSIS — K5730 Diverticulosis of large intestine without perforation or abscess without bleeding: Secondary | ICD-10-CM | POA: Diagnosis not present

## 2014-10-30 DIAGNOSIS — Z7951 Long term (current) use of inhaled steroids: Secondary | ICD-10-CM | POA: Diagnosis not present

## 2014-10-30 DIAGNOSIS — I252 Old myocardial infarction: Secondary | ICD-10-CM | POA: Insufficient documentation

## 2014-10-30 DIAGNOSIS — R1032 Left lower quadrant pain: Secondary | ICD-10-CM | POA: Diagnosis not present

## 2014-10-30 DIAGNOSIS — G8929 Other chronic pain: Secondary | ICD-10-CM | POA: Diagnosis not present

## 2014-10-30 DIAGNOSIS — K529 Noninfective gastroenteritis and colitis, unspecified: Secondary | ICD-10-CM | POA: Insufficient documentation

## 2014-10-30 DIAGNOSIS — Z79891 Long term (current) use of opiate analgesic: Secondary | ICD-10-CM | POA: Insufficient documentation

## 2014-10-30 DIAGNOSIS — K259 Gastric ulcer, unspecified as acute or chronic, without hemorrhage or perforation: Secondary | ICD-10-CM | POA: Insufficient documentation

## 2014-10-30 DIAGNOSIS — J449 Chronic obstructive pulmonary disease, unspecified: Secondary | ICD-10-CM | POA: Insufficient documentation

## 2014-10-30 DIAGNOSIS — F17200 Nicotine dependence, unspecified, uncomplicated: Secondary | ICD-10-CM | POA: Insufficient documentation

## 2014-10-30 DIAGNOSIS — R197 Diarrhea, unspecified: Secondary | ICD-10-CM | POA: Diagnosis present

## 2014-10-30 HISTORY — PX: COLONOSCOPY WITH PROPOFOL: SHX5780

## 2014-10-30 HISTORY — PX: ESOPHAGOGASTRODUODENOSCOPY (EGD) WITH PROPOFOL: SHX5813

## 2014-10-30 SURGERY — ESOPHAGOGASTRODUODENOSCOPY (EGD) WITH PROPOFOL
Anesthesia: General

## 2014-10-30 MED ORDER — PROPOFOL 500 MG/50ML IV EMUL
INTRAVENOUS | Status: DC | PRN
  Administered 2014-10-30: 100 ug/kg/min via INTRAVENOUS

## 2014-10-30 MED ORDER — FENTANYL CITRATE (PF) 100 MCG/2ML IJ SOLN
INTRAMUSCULAR | Status: DC | PRN
  Administered 2014-10-30: 50 ug via INTRAVENOUS

## 2014-10-30 MED ORDER — PHENYLEPHRINE HCL 10 MG/ML IJ SOLN
INTRAMUSCULAR | Status: DC | PRN
  Administered 2014-10-30: 100 ug via INTRAVENOUS

## 2014-10-30 MED ORDER — SODIUM CHLORIDE 0.9 % IV SOLN
INTRAVENOUS | Status: DC
  Administered 2014-10-30: 1000 mL via INTRAVENOUS

## 2014-10-30 MED ORDER — SODIUM CHLORIDE 0.9 % IV SOLN
INTRAVENOUS | Status: DC
  Administered 2014-10-30: 14:00:00 via INTRAVENOUS
  Administered 2014-10-30: 1000 mL via INTRAVENOUS
  Administered 2014-10-30: 15:00:00 via INTRAVENOUS

## 2014-10-30 MED ORDER — MIDAZOLAM HCL 5 MG/5ML IJ SOLN
INTRAMUSCULAR | Status: DC | PRN
  Administered 2014-10-30: 2 mg via INTRAVENOUS

## 2014-10-30 MED ORDER — PROPOFOL 10 MG/ML IV BOLUS
INTRAVENOUS | Status: DC | PRN
  Administered 2014-10-30: 50 mg via INTRAVENOUS
  Administered 2014-10-30: 20 mg via INTRAVENOUS

## 2014-10-30 NOTE — Anesthesia Procedure Notes (Signed)
Performed by: Jadan Rouillard Pre-anesthesia Checklist: Patient identified, Emergency Drugs available, Suction available, Patient being monitored and Timeout performed Patient Re-evaluated:Patient Re-evaluated prior to inductionOxygen Delivery Method: Nasal cannula     

## 2014-10-30 NOTE — Anesthesia Postprocedure Evaluation (Signed)
  Anesthesia Post-op Note  Patient: Debbie Gay  Procedure(s) Performed: Procedure(s): ESOPHAGOGASTRODUODENOSCOPY (EGD) WITH PROPOFOL (N/A) COLONOSCOPY WITH PROPOFOL (N/A)  Anesthesia type:General  Patient location: PACU  Post pain: Pain level controlled  Post assessment: Post-op Vital signs reviewed, Patient's Cardiovascular Status Stable, Respiratory Function Stable, Patent Airway and No signs of Nausea or vomiting  Post vital signs: Reviewed and stable  Last Vitals:  Filed Vitals:   10/30/14 1554  BP: 117/80  Pulse: 80  Temp:   Resp: 12    Level of consciousness: awake, alert  and patient cooperative  Complications: No apparent anesthesia complications

## 2014-10-30 NOTE — Op Note (Signed)
[facility] Gastroenterology Patient Name: Juan QuamCarrie Dutko Procedure Date: 10/30/2014 2:14 PM MRN: [id number] Account #: [id number] Date of Birth: 1970-10-02 Admit Type: Outpatient Age: [age] [facility] Gender: Female Note Status: Finalized Procedure:         Upper GI endoscopy Indications:       Epigastric abdominal pain, Abnormal CT of the GI tract Providers:         Christena DeemMartin U. Skulskie, MD Referring MD:      Delora FuelWilliam L. Garlick (Referring MD) Medicines:         Monitored Anesthesia Care Complications:     No immediate complications. Procedure:         Pre-Anesthesia Assessment:                    - ASA Grade Assessment: III - A patient with severe                     systemic disease.                    After obtaining informed consent, the endoscope was passed                     under direct vision. Throughout the procedure, the                     patient's blood pressure, pulse, and oxygen saturations                     were monitored continuously. The Olympus GIF-160 endoscope                     (S#. [id number]) was introduced through the mouth, and                     advanced to the pylorus. The upper GI endoscopy was                     accomplished without difficulty. The patient tolerated the                     procedure well. Findings:      The Z-line was regular. Biopsies were taken with a cold forceps for       histology.      The exam of the esophagus was otherwise normal.      Diffuse mildly erythematous mucosa was found in the gastric body.       Biopsies were taken with a cold forceps for histology. Biopsies were       taken with a cold forceps for Helicobacter pylori testing.      One non-bleeding cratered gastric ulcer with no stigmata of bleeding was       found at the pylorus. The lesion was 11 mm in largest dimension. Margins       were not atypical, I did not pass the scope beyond the ulcer due to       narrowing and inflammation  in the pylorus. Atypical orientation of       stomach with pylorus at 900 rather thatn 12-300.      The cardia and gastric fundus were normal on retroflexion. Impression:        - Z-line regular. Biopsied.                    -  Erythematous mucosa in the gastric body. Biopsied.                    - Gastric ulcer with clean base. Recommendation:    - Await pathology results.                    - Use Protonix (pantoprazole) 40 mg PO BID for 6 weeks.                    - Use Protonix (pantoprazole) 40 mg PO daily daily.                    - Repeat the upper endoscopy in 7 weeks to check healing. Procedure Code(s): --- Professional ---                    [phone], 52, Esophagogastroduodenoscopy, flexible,                     transoral; with biopsy, single or multiple Diagnosis Code(s): --- Professional ---                    537.89, Other specified disorders of stomach and duodenum                    531.90, Gastric ulcer, unspecified as acute or chronic,                     without mention of hemorrhage or perforation, without                     mention of obstruction                    789.06, Abdominal pain, epigastric                    793.4, Nonspecific (abnormal) findings on radiological and                     other examination of gastrointestinal tract CPT copyright 2014 American Medical Association. All rights reserved. The codes documented in this report are preliminary and upon coder review may  be revised to meet current compliance requirements. Christena Deem, MD 10/30/2014 2:44:29 PM This report has been signed electronically. Number of Addenda: 0 Note Initiated On: 10/30/2014 2:14 PM      [facility]

## 2014-10-30 NOTE — Anesthesia Preprocedure Evaluation (Signed)
Anesthesia Evaluation  Patient identified by MRN, date of birth, ID band Patient awake    Reviewed: Allergy & Precautions, NPO status , Patient's Chart, lab work & pertinent test results  Airway Mallampati: II  TM Distance: >3 FB Neck ROM: Full    Dental   Pulmonary COPD, Current Smoker,    Pulmonary exam normal        Cardiovascular Normal cardiovascular exam     Neuro/Psych Anxiety Bipolar Disorder    GI/Hepatic GERD  ,  Endo/Other    Renal/GU      Musculoskeletal   Abdominal   Peds  Hematology   Anesthesia Other Findings   Reproductive/Obstetrics                             Anesthesia Physical Anesthesia Plan  ASA: III  Anesthesia Plan: General   Post-op Pain Management:    Induction:   Airway Management Planned: Nasal Cannula  Additional Equipment:   Intra-op Plan:   Post-operative Plan:   Informed Consent: I have reviewed the patients History and Physical, chart, labs and discussed the procedure including the risks, benefits and alternatives for the proposed anesthesia with the patient or authorized representative who has indicated his/her understanding and acceptance.     Plan Discussed with: CRNA  Anesthesia Plan Comments:         Anesthesia Quick Evaluation

## 2014-10-30 NOTE — Op Note (Addendum)
[facility] Gastroenterology Patient Name: Debbie Gay Procedure Date: 10/30/2014 2:14 PM MRN: [id number] Account #: [id number] Date of Birth: December 04, 1970 Admit Type: Outpatient Age: 44 Room: [facility] ENDO ROOM 3 Gender: Female Note Status: Supervisor Override Procedure:         Colonoscopy Indications:       Epigastric abdominal pain, Abdominal pain in the left                     lower quadrant, Constipation, Diarrhea Providers:         Christena Deem, MD Referring MD:      Delora Fuel (Referring MD) Medicines:         Monitored Anesthesia Care Complications:     No immediate complications. Procedure:         Pre-Anesthesia Assessment:                    - ASA Grade Assessment: III - A patient with severe                     systemic disease.                    After obtaining informed consent, the colonoscope was                     passed under direct vision. Throughout the procedure, the                     patient's blood pressure, pulse, and oxygen saturations                     were monitored continuously. The Colonoscope was                     introduced through the anus and advanced to the the cecum,                     identified by appendiceal orifice and ileocecal valve. The                     colonoscopy was performed with moderate difficulty due to                     a tortuous colon. Successful completion of the procedure                     was aided by using manual pressure. The patient tolerated                     the procedure well. The quality of the bowel preparation                     was fair. Findings:      A few small-mouthed diverticula were found in the sigmoid colon.      The colon (entire examined portion) was moderately tortuous.      The exam was otherwise without abnormality.      Biopsies for histology were taken with a cold forceps from the right       colon and left colon for evaluation of microscopic colitis.     Non-bleeding external hemorrhoids were found during perianal exam. The       hemorrhoids were medium-sized.      The digital rectal exam was  normal.      A 2 mm polyp was found in the rectum. The polyp was sessile. The polyp       was removed with a cold biopsy forceps. Resection and retrieval were       complete. Impression:        - Diverticulosis in the sigmoid colon.                    - Tortuous colon.                    - The examination was otherwise normal.                    - Non-bleeding external hemorrhoids.                    - Biopsies were taken with a cold forceps from the right                     colon and left colon for evaluation of microscopic colitis. Recommendation:    [employer]- [employer] colon health one capsule daily Procedure Code(s): --- Professional ---                    [phone], Colonoscopy, flexible; with biopsy, single or                     multiple Diagnosis Code(s): --- Professional ---                    455.3, External hemorrhoids without mention of complication                    789.06, Abdominal pain, epigastric                    789.04, Abdominal pain, left lower quadrant                    564.00, Constipation, unspecified                    787.91, Diarrhea                    562.10, Diverticulosis of colon (without mention of                     hemorrhage)                    751.5, Other anomalies of intestine CPT copyright 2014 American Medical Association. All rights reserved. The codes documented in this report are preliminary and upon coder review may  be revised to meet current compliance requirements. Christena DeemMartin U Skulskie, MD 10/30/2014 3:18:57 PM This report has been signed electronically. Number of Addenda: 0 Note Initiated On: 10/30/2014 2:14 PM Scope Withdrawal Time: 0 hours 10 minutes 48 seconds  Total Procedure Duration: 0 hours 25 minutes 17 seconds       [facility]

## 2014-10-30 NOTE — Transfer of Care (Signed)
Immediate Anesthesia Transfer of Care Note  Patient: Debbie Gay  Procedure(s) Performed: Procedure(s): ESOPHAGOGASTRODUODENOSCOPY (EGD) WITH PROPOFOL (N/A) COLONOSCOPY WITH PROPOFOL (N/A)  Patient Location: Endoscopy Unit  Anesthesia Type:General  Level of Consciousness: awake, alert , oriented and patient cooperative  Airway & Oxygen Therapy: Patient Spontanous Breathing and Patient connected to nasal cannula oxygen  Post-op Assessment: Report given to RN, Post -op Vital signs reviewed and stable and Patient moving all extremities X 4  Post vital signs: Reviewed and stable  Last Vitals:  Filed Vitals:   10/30/14 1309  BP: 93/61  Pulse: 86  Temp: 37 C  Resp: 20    Complications: No apparent anesthesia complications

## 2014-10-30 NOTE — H&P (Addendum)
Outpatient short stay form Pre-procedure 10/30/2014 2:14 PM Debbie DeemMartin U Skulskie MD  Primary Physician: Dr. Maida SaleJane Gay  Reason for visit:  EGD and colonoscopy  History of present illness:  Patient is a 44 year old female with a number of GI complaints. She states she's had "digestive issues" for a long time. She is been having epigastric pain for months. She has been to the emergency room for further evaluation. She takes OxyContin for chronic back pain. She has been taking Goody powders multiple times weekly. He will occasionally take Prilosec. Will occasionally see some bright red blood on her tissue and has been told in the past she has irritable bowel. He is a current smoker 40-pack-year history. She had a CT scan done on 10/16/2014 during a emergency room visit. This was of the abdomen and pelvis showing some localized wall thickening in the region of the gastric pylorus and question  degree of distal gastritis.    Current facility-administered medications:  .  0.9 %  sodium chloride infusion, , Intravenous, Continuous, Debbie DeemMartin U Skulskie, MD, Last Rate: 20 mL/hr at 10/30/14 1317, 1,000 mL at 10/30/14 1317 .  0.9 %  sodium chloride infusion, , Intravenous, Continuous, Debbie DeemMartin U Skulskie, MD  Prescriptions prior to admission  Medication Sig Dispense Refill Last Dose  . clonazePAM (KLONOPIN) 2 MG tablet Take 2 mg by mouth 2 (two) times daily. May take 1/2 a tablet as needed.   10/29/2014 at 1100  . FLUoxetine (PROZAC) 40 MG capsule Take 40 mg by mouth daily.   Past Week at Unknown time  . fluticasone (FLONASE) 50 MCG/ACT nasal spray Place 2 sprays into both nostrils daily.   Past Week at Unknown time  . gabapentin (NEURONTIN) 300 MG capsule Take 600 mg by mouth at bedtime.   Past Week at Unknown time  . omeprazole (PRILOSEC) 40 MG capsule Take 40 mg by mouth daily.   10/29/2014 at 1100pm  . Oxycodone HCl 10 MG TABS Take 20 mg by mouth 3 (three) times daily.   10/30/2014 at 0735  .  oxyCODONE-acetaminophen (ROXICET) 5-325 MG tablet Take 1 tablet by mouth every 6 (six) hours as needed. 20 tablet 0 10/30/2014 at Unknown time  . pantoprazole (PROTONIX) 40 MG tablet Take 1 tablet (40 mg total) by mouth daily. 30 tablet 1 10/29/2014 at 1100  . QUEtiapine (SEROQUEL) 100 MG tablet Take 100-200 mg by mouth at bedtime.   10/29/2014 at 1100     Allergies  Allergen Reactions  . Prednisone Shortness Of Breath and Swelling  . Levaquin [Levofloxacin] Swelling  . Penicillins      Past Medical History  Diagnosis Date  . COPD (chronic obstructive pulmonary disease) (HCC)   . Panic attacks   . Bipolar 1 disorder (HCC)   . History of heart attack   . GERD (gastroesophageal reflux disease)     Review of systems:      Physical Exam    Heart and lungs: Regular rate and rhythm without rub or gallop, lungs are bilaterally clear    HEENT: Normocephalic atraumatic eyes are anicteric    Other:     Pertinant exam for procedure: Soft tender to palpation the epigastric region there are no masses rebound or organomegaly. There is no distention bowel sounds are positive normoactive.    Planned proceedures: EGD and colonoscopy with indicated procedures I have discussed the risks benefits and complications of procedures to include not limited to bleeding, infection, perforation and the risk of sedation and the patient wishes  to proceed.    Debbie Deem, MD Gastroenterology 10/30/2014  2:14 PM

## 2014-11-01 ENCOUNTER — Encounter: Payer: Self-pay | Admitting: Gastroenterology

## 2014-11-03 LAB — SURGICAL PATHOLOGY

## 2015-07-18 ENCOUNTER — Encounter: Payer: Self-pay | Admitting: Emergency Medicine

## 2015-07-18 ENCOUNTER — Emergency Department
Admission: EM | Admit: 2015-07-18 | Discharge: 2015-07-18 | Disposition: A | Discharge status: Other Acute Inpatient Hospital | Payer: Medicaid Other | Attending: Emergency Medicine | Admitting: Emergency Medicine

## 2015-07-18 ENCOUNTER — Emergency Department: Payer: Medicaid Other

## 2015-07-18 ENCOUNTER — Ambulatory Visit (HOSPITAL_COMMUNITY)
Admission: AD | Admit: 2015-07-18 | Discharge: 2015-07-18 | Disposition: A | Discharge status: Home / Self Care | Payer: Medicaid Other | Source: Other Acute Inpatient Hospital | Attending: Emergency Medicine | Admitting: Emergency Medicine

## 2015-07-18 DIAGNOSIS — J449 Chronic obstructive pulmonary disease, unspecified: Secondary | ICD-10-CM | POA: Insufficient documentation

## 2015-07-18 DIAGNOSIS — Z79899 Other long term (current) drug therapy: Secondary | ICD-10-CM | POA: Diagnosis not present

## 2015-07-18 DIAGNOSIS — F17200 Nicotine dependence, unspecified, uncomplicated: Secondary | ICD-10-CM | POA: Diagnosis not present

## 2015-07-18 DIAGNOSIS — Y92410 Unspecified street and highway as the place of occurrence of the external cause: Secondary | ICD-10-CM | POA: Diagnosis not present

## 2015-07-18 DIAGNOSIS — S299XXA Unspecified injury of thorax, initial encounter: Secondary | ICD-10-CM | POA: Diagnosis present

## 2015-07-18 DIAGNOSIS — Y939 Activity, unspecified: Secondary | ICD-10-CM | POA: Diagnosis not present

## 2015-07-18 DIAGNOSIS — F319 Bipolar disorder, unspecified: Secondary | ICD-10-CM | POA: Diagnosis not present

## 2015-07-18 DIAGNOSIS — S2242XA Multiple fractures of ribs, left side, initial encounter for closed fracture: Secondary | ICD-10-CM | POA: Diagnosis not present

## 2015-07-18 DIAGNOSIS — Y999 Unspecified external cause status: Secondary | ICD-10-CM | POA: Diagnosis not present

## 2015-07-18 DIAGNOSIS — J45909 Unspecified asthma, uncomplicated: Secondary | ICD-10-CM | POA: Insufficient documentation

## 2015-07-18 DIAGNOSIS — R55 Syncope and collapse: Secondary | ICD-10-CM | POA: Insufficient documentation

## 2015-07-18 DIAGNOSIS — J942 Hemothorax: Secondary | ICD-10-CM | POA: Insufficient documentation

## 2015-07-18 HISTORY — DX: Other chronic pain: G89.29

## 2015-07-18 HISTORY — DX: Unspecified asthma, uncomplicated: J45.909

## 2015-07-18 LAB — COMPREHENSIVE METABOLIC PANEL
ALT: 83 U/L — AB (ref 14–54)
AST: 135 U/L — AB (ref 15–41)
Albumin: 3.8 g/dL (ref 3.5–5.0)
Alkaline Phosphatase: 67 U/L (ref 38–126)
Anion gap: 8 (ref 5–15)
BILIRUBIN TOTAL: 0.6 mg/dL (ref 0.3–1.2)
BUN: 20 mg/dL (ref 6–20)
CALCIUM: 8.7 mg/dL — AB (ref 8.9–10.3)
CO2: 27 mmol/L (ref 22–32)
CREATININE: 0.77 mg/dL (ref 0.44–1.00)
Chloride: 102 mmol/L (ref 101–111)
GFR calc Af Amer: >60 mL/min (ref >60)
Glucose, Bld: 151 mg/dL — ABNORMAL HIGH (ref 65–99)
Potassium: 3.7 mmol/L (ref 3.5–5.1)
Sodium: 137 mmol/L (ref 135–145)
TOTAL PROTEIN: 6.5 g/dL (ref 6.5–8.1)

## 2015-07-18 LAB — CBC WITH DIFFERENTIAL/PLATELET
Basophils Absolute: 0.1 10*3/uL (ref 0–0.1)
Basophils Relative: 0 %
Eosinophils Absolute: 0 10*3/uL (ref 0–0.7)
Eosinophils Relative: 0 %
HEMATOCRIT: 37.6 % (ref 35.0–47.0)
Hemoglobin: 12.7 g/dL (ref 12.0–16.0)
LYMPHS PCT: 8 %
Lymphs Abs: 1.3 10*3/uL (ref 1.0–3.6)
MCH: 32.4 pg (ref 26.0–34.0)
MCHC: 33.8 g/dL (ref 32.0–36.0)
MCV: 96 fL (ref 80.0–100.0)
MONO ABS: 1 10*3/uL — AB (ref 0.2–0.9)
MONOS PCT: 6 %
NEUTROS ABS: 14.5 10*3/uL — AB (ref 1.4–6.5)
Neutrophils Relative %: 86 %
Platelets: 196 10*3/uL (ref 150–440)
RBC: 3.92 MIL/uL (ref 3.80–5.20)
RDW: 12.6 % (ref 11.5–14.5)
WBC: 16.9 10*3/uL — ABNORMAL HIGH (ref 3.6–11.0)

## 2015-07-18 LAB — PROTIME-INR
INR: 1.16
Prothrombin Time: 15 seconds (ref 11.4–15.0)

## 2015-07-18 LAB — LIPASE, BLOOD: Lipase: 23 U/L (ref 11–51)

## 2015-07-18 LAB — ETHANOL

## 2015-07-18 MED ORDER — FENTANYL CITRATE (PF) 100 MCG/2ML IJ SOLN
50.0000 ug | Freq: Once | INTRAMUSCULAR | Status: AC
  Administered 2015-07-18: 50 ug via INTRAVENOUS
  Filled 2015-07-18: qty 2

## 2015-07-18 NOTE — ED Notes (Signed)
Pt states that she wants to take all of her valuables with her.
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
## 2015-07-19 DIAGNOSIS — F17200 Nicotine dependence, unspecified, uncomplicated: Secondary | ICD-10-CM | POA: Diagnosis present

## 2015-07-19 LAB — TYPE AND SCREEN
ABO/RH(D): O POS
Antibody Screen: NEGATIVE

## 2015-08-03 DIAGNOSIS — Z8659 Personal history of other mental and behavioral disorders: Secondary | ICD-10-CM | POA: Insufficient documentation

## 2015-08-03 DIAGNOSIS — F1320 Sedative, hypnotic or anxiolytic dependence, uncomplicated: Secondary | ICD-10-CM | POA: Insufficient documentation

## 2015-08-03 DIAGNOSIS — F1120 Opioid dependence, uncomplicated: Secondary | ICD-10-CM | POA: Insufficient documentation

## 2016-08-03 ENCOUNTER — Other Ambulatory Visit: Payer: Self-pay | Admitting: Gastroenterology

## 2016-08-03 DIAGNOSIS — R1013 Epigastric pain: Secondary | ICD-10-CM

## 2016-08-03 DIAGNOSIS — R1012 Left upper quadrant pain: Secondary | ICD-10-CM

## 2016-08-09 ENCOUNTER — Ambulatory Visit: Payer: No Typology Code available for payment source | Attending: Gastroenterology

## 2016-10-21 ENCOUNTER — Encounter: Payer: Self-pay | Admitting: Emergency Medicine

## 2016-10-21 ENCOUNTER — Emergency Department
Admission: EM | Admit: 2016-10-21 | Discharge: 2016-10-21 | Disposition: A | Discharge status: Home / Self Care | Payer: No Typology Code available for payment source | Attending: Emergency Medicine | Admitting: Emergency Medicine

## 2016-10-21 DIAGNOSIS — Z79899 Other long term (current) drug therapy: Secondary | ICD-10-CM | POA: Diagnosis not present

## 2016-10-21 DIAGNOSIS — J45909 Unspecified asthma, uncomplicated: Secondary | ICD-10-CM | POA: Diagnosis not present

## 2016-10-21 DIAGNOSIS — J449 Chronic obstructive pulmonary disease, unspecified: Secondary | ICD-10-CM | POA: Insufficient documentation

## 2016-10-21 DIAGNOSIS — F17200 Nicotine dependence, unspecified, uncomplicated: Secondary | ICD-10-CM | POA: Diagnosis not present

## 2016-10-21 DIAGNOSIS — L03811 Cellulitis of head [any part, except face]: Secondary | ICD-10-CM | POA: Diagnosis not present

## 2016-10-21 DIAGNOSIS — F319 Bipolar disorder, unspecified: Secondary | ICD-10-CM | POA: Insufficient documentation

## 2016-10-21 DIAGNOSIS — R21 Rash and other nonspecific skin eruption: Secondary | ICD-10-CM | POA: Diagnosis present

## 2016-10-21 MED ORDER — SULFAMETHOXAZOLE-TRIMETHOPRIM 800-160 MG PO TABS: 1.0000 | ORAL_TABLET | Freq: Two times a day (BID) | ORAL | 0 refills | Status: DC

## 2016-10-21 MED ORDER — SULFAMETHOXAZOLE-TRIMETHOPRIM 800-160 MG PO TABS
1.0000 | ORAL_TABLET | Freq: Once | ORAL | Status: AC
  Administered 2016-10-21: 1 via ORAL
  Filled 2016-10-21: qty 1

## 2016-10-21 NOTE — ED Triage Notes (Signed)
Pt reports noticed a knot to right side of her neck not sure if she got an insect bite to her head reports painful to touch, denies any falls or injury to her head, pt talks in complete sentences no distress noted ambulatory to triage

## 2016-10-21 NOTE — ED Provider Notes (Signed)
[facility] Emergency Department Provider Note  ____________________________________________   First MD Initiated Contact with Patient 10/21/16 1353     (approximate)  I have reviewed the triage vital signs and the nursing notes.   HISTORY  Chief Complaint Insect Bite (Head)   HPI Debbie Gay is a 46 y.o. female is here with complaint of a "knot" to the right side of her scalp that is painful to touch. Patient denies any injury and is not sure if she had an insect bite. She denies any itching She feels as if her right ear is hot to touch. She has taken a "goody powder"  along with her regular pain medication today.      Past Medical History:  Diagnosis Date  . Asthma   . Bipolar 1 disorder (HCC)   . Chronic pain   . COPD (chronic obstructive pulmonary disease) (HCC)   . GERD (gastroesophageal reflux disease)   . History of heart attack   . Panic attacks     There are no active problems to display for this patient.   Past Surgical History:  Procedure Laterality Date  . ABDOMINAL HYSTERECTOMY    . COLONOSCOPY WITH PROPOFOL N/A 10/30/2014   Procedure: COLONOSCOPY WITH PROPOFOL;  Surgeon: Christena Deem, MD;  Location: [facility] ENDOSCOPY;  Service: Endoscopy;  Laterality: N/A;  . ESOPHAGOGASTRODUODENOSCOPY (EGD) WITH PROPOFOL N/A 10/30/2014   Procedure: ESOPHAGOGASTRODUODENOSCOPY (EGD) WITH PROPOFOL;  Surgeon: Christena Deem, MD;  Location: [facility] ENDOSCOPY;  Service: Endoscopy;  Laterality: N/A;    Prior to Admission medications   Medication Sig Start Date End Date Taking? Authorizing Provider  clonazePAM (KLONOPIN) 2 MG tablet Take 2 mg by mouth 2 (two) times daily. May take 1/2 a tablet as needed.    [provider]  FLUoxetine (PROZAC) 40 MG capsule Take 40 mg by mouth daily.    [provider]  fluticasone (FLONASE) 50 MCG/ACT nasal spray Place 2 sprays into both nostrils daily.    [provider]  gabapentin  (NEURONTIN) 300 MG capsule Take 600 mg by mouth at bedtime.    [provider]  omeprazole (PRILOSEC) 40 MG capsule Take 40 mg by mouth daily.    [provider]  Oxycodone HCl 10 MG TABS Take 20 mg by mouth 3 (three) times daily.    [provider]  pantoprazole (PROTONIX) 40 MG tablet Take 1 tablet (40 mg total) by mouth daily. 10/16/14 10/16/15  Minna Antis, MD  QUEtiapine (SEROQUEL) 100 MG tablet Take 100-200 mg by mouth at bedtime.    [provider]  sulfamethoxazole-trimethoprim (BACTRIM DS,SEPTRA DS) 800-160 MG tablet Take 1 tablet by mouth 2 (two) times daily. 10/21/16   Tommi Rumps, PA-C    Allergies Prednisone; Levaquin [levofloxacin]; and Penicillins  No family history on file.  Social History Social History  Substance Use Topics  . Smoking status: Current Every Day Smoker  . Smokeless tobacco: Never Used  . Alcohol use No    Review of Systems Constitutional: questionable fever/no chills Eyes: No visual changes. ENT: No sore throat. Positive right ear pain. Cardiovascular: Denies chest pain. Respiratory: Denies shortness of breath. Gastrointestinal:   No nausea, no vomiting.   Musculoskeletal: Negative for back pain. Skin: positive for pain  right scalp. Neurological: Negative for headaches, focal weakness or numbness. ____________________________________________   PHYSICAL EXAM:  VITAL SIGNS: ED Triage Vitals  Enc Vitals Group     BP 10/21/16 1310 109/73  Pulse Rate 10/21/16 1310 (!) 101     Resp 10/21/16 1310 20     Temp 10/21/16 1310 99 F (37.2 C)     Temp Source 10/21/16 1310 Oral     SpO2 10/21/16 1310 95 %     Weight 10/21/16 1312 140 lb (63.5 kg)     Height 10/21/16 1312 5\' 3"  (1.6 m)     Head Circumference --      Peak Flow --      Pain Score --      Pain Loc --      Pain Edu? --      Excl. in GC? --    Constitutional: Alert and oriented. Well appearing and in no acute distress. Eyes:  Conjunctivae are normal.  Head: Atraumatic. Nose: No congestion/rhinnorhea.  EACs and TMs are clear bilaterally. Neck: No stridor.   Hematological/Lymphatic/Immunilogical: No cervical lymphadenopathy. Cardiovascular: Normal rate, regular rhythm. Grossly normal heart sounds.  Good peripheral circulation. Respiratory: Normal respiratory effort.  No retractions. Lungs CTAB. Musculoskeletal: Ms. Patton SallesUpper and lower extremities without any difficulty. Normal gait was noted. Neurologic:  Normal speech and language. No gross focal neurologic deficits are appreciated. No gait instability. Skin:  Skin is warm, dry and intact. Right scalp is erythematous in a 2 cm diameter that is tender to touch. There is some warmth to the outer ear. No definite abscess is seen but there is some minimal soft tissue swelling in the same area. No drainage is noted. Psychiatric: Mood and affect are normal. Speech and behavior are normal.  ____________________________________________   LABS (all labs ordered are listed, but only abnormal results are displayed)  Labs Reviewed - No data to display   PROCEDURES  Procedure(s) performed: None  Procedures  Critical Care performed: No  ____________________________________________   INITIAL IMPRESSION / ASSESSMENT AND PLAN / ED COURSE  Area to the right scalp appears to be cellulitic. Patient was placed on Bactrim DS twice a day for 10 days. She is continue with her regular medication as directed by her PCP. She is also encouraged to use warm compresses to her scalp 34 times per day. She is return to the emergency room if any severe worsening or high fever.   ___________________________________________   FINAL CLINICAL IMPRESSION(S) / ED DIAGNOSES  Final diagnoses:  Cellulitis of scalp      NEW MEDICATIONS STARTED DURING THIS VISIT:  Discharge Medication List as of 10/21/2016  2:16 PM    START taking these medications   Details    sulfamethoxazole-trimethoprim (BACTRIM DS,SEPTRA DS) 800-160 MG tablet Take 1 tablet by mouth 2 (two) times daily., Starting Sat 10/21/2016, Print         Note:  This document was prepared using Dragon voice recognition software and may include unintentional dictation errors.    Tommi RumpsSummers, Johnn Krasowski L, PA-C 10/21/16 1451    Governor RooksLord, Rebecca, MD 10/21/16 [phone]

## 2016-10-21 NOTE — ED Notes (Signed)
Pt discharged to home.  Family member driving.  Discharge instructions reviewed.  Verbalized understanding.  No questions or concerns at this time.  Teach back verified.  Pt in NAD.  No items left in ED.   

## 2016-10-21 NOTE — Discharge Instructions (Signed)
Follow-up with your primary care doctor if any continued problems. You may use warm compresses to your scalp 3-4 times per day. Begin taking antibiotics twice a day for 10 days. Continue regular medication. Return to the emergency room over the weekend if any severe worsening of yoursymptoms.

## 2017-11-20 ENCOUNTER — Other Ambulatory Visit
Admission: RE | Admit: 2017-11-20 | Discharge: 2017-11-20 | Disposition: A | Discharge status: Home / Self Care | Payer: Medicaid Other | Source: Ambulatory Visit | Attending: Gastroenterology | Admitting: Gastroenterology

## 2017-11-20 DIAGNOSIS — R1013 Epigastric pain: Secondary | ICD-10-CM | POA: Diagnosis present

## 2017-11-20 LAB — TROPONIN I: Troponin I: <0.03 ng/mL (ref <0.03)

## 2017-11-21 ENCOUNTER — Ambulatory Visit: Payer: Medicaid Other | Admitting: Certified Registered Nurse Anesthetist

## 2017-11-21 ENCOUNTER — Inpatient Hospital Stay
Admission: RE | Admit: 2017-11-21 | Discharge: 2017-11-23 | DRG: 641 | Disposition: A | Discharge status: Home / Self Care | Payer: Medicaid Other | Source: Ambulatory Visit | Attending: Internal Medicine | Admitting: Internal Medicine

## 2017-11-21 ENCOUNTER — Encounter
Admission: RE | Disposition: A | Discharge status: Home / Self Care | Payer: Self-pay | Source: Ambulatory Visit | Attending: Internal Medicine

## 2017-11-21 ENCOUNTER — Other Ambulatory Visit: Payer: Self-pay

## 2017-11-21 ENCOUNTER — Encounter: Payer: Self-pay | Admitting: *Deleted

## 2017-11-21 DIAGNOSIS — Z83511 Family history of glaucoma: Secondary | ICD-10-CM

## 2017-11-21 DIAGNOSIS — F410 Panic disorder [episodic paroxysmal anxiety] without agoraphobia: Secondary | ICD-10-CM | POA: Diagnosis present

## 2017-11-21 DIAGNOSIS — Z8719 Personal history of other diseases of the digestive system: Secondary | ICD-10-CM

## 2017-11-21 DIAGNOSIS — I252 Old myocardial infarction: Secondary | ICD-10-CM

## 2017-11-21 DIAGNOSIS — Z880 Allergy status to penicillin: Secondary | ICD-10-CM

## 2017-11-21 DIAGNOSIS — Z825 Family history of asthma and other chronic lower respiratory diseases: Secondary | ICD-10-CM

## 2017-11-21 DIAGNOSIS — K219 Gastro-esophageal reflux disease without esophagitis: Secondary | ICD-10-CM | POA: Diagnosis present

## 2017-11-21 DIAGNOSIS — K315 Obstruction of duodenum: Secondary | ICD-10-CM | POA: Diagnosis present

## 2017-11-21 DIAGNOSIS — J449 Chronic obstructive pulmonary disease, unspecified: Secondary | ICD-10-CM | POA: Diagnosis present

## 2017-11-21 DIAGNOSIS — Z8349 Family history of other endocrine, nutritional and metabolic diseases: Secondary | ICD-10-CM

## 2017-11-21 DIAGNOSIS — Z8619 Personal history of other infectious and parasitic diseases: Secondary | ICD-10-CM

## 2017-11-21 DIAGNOSIS — Z79899 Other long term (current) drug therapy: Secondary | ICD-10-CM

## 2017-11-21 DIAGNOSIS — Z881 Allergy status to other antibiotic agents status: Secondary | ICD-10-CM

## 2017-11-21 DIAGNOSIS — F319 Bipolar disorder, unspecified: Secondary | ICD-10-CM | POA: Diagnosis present

## 2017-11-21 DIAGNOSIS — K2950 Unspecified chronic gastritis without bleeding: Secondary | ICD-10-CM | POA: Diagnosis present

## 2017-11-21 DIAGNOSIS — Z888 Allergy status to other drugs, medicaments and biological substances status: Secondary | ICD-10-CM

## 2017-11-21 DIAGNOSIS — E785 Hyperlipidemia, unspecified: Secondary | ICD-10-CM | POA: Diagnosis present

## 2017-11-21 DIAGNOSIS — F17210 Nicotine dependence, cigarettes, uncomplicated: Secondary | ICD-10-CM | POA: Diagnosis present

## 2017-11-21 DIAGNOSIS — R112 Nausea with vomiting, unspecified: Secondary | ICD-10-CM | POA: Diagnosis present

## 2017-11-21 DIAGNOSIS — Z86010 Personal history of colonic polyps: Secondary | ICD-10-CM

## 2017-11-21 DIAGNOSIS — E876 Hypokalemia: Principal | ICD-10-CM | POA: Diagnosis present

## 2017-11-21 DIAGNOSIS — R110 Nausea: Secondary | ICD-10-CM | POA: Diagnosis present

## 2017-11-21 DIAGNOSIS — Z79891 Long term (current) use of opiate analgesic: Secondary | ICD-10-CM

## 2017-11-21 DIAGNOSIS — Z90710 Acquired absence of both cervix and uterus: Secondary | ICD-10-CM

## 2017-11-21 DIAGNOSIS — G8929 Other chronic pain: Secondary | ICD-10-CM | POA: Diagnosis present

## 2017-11-21 DIAGNOSIS — Z8711 Personal history of peptic ulcer disease: Secondary | ICD-10-CM

## 2017-11-21 HISTORY — DX: Acute myocardial infarction, unspecified: I21.9

## 2017-11-21 LAB — CBC
HCT: 38.9 % (ref 36.0–46.0)
Hemoglobin: 13.2 g/dL (ref 12.0–15.0)
MCH: 33.8 pg (ref 26.0–34.0)
MCHC: 33.9 g/dL (ref 30.0–36.0)
MCV: 99.5 fL (ref 80.0–100.0)
Platelets: 293 10*3/uL (ref 150–400)
RBC: 3.91 MIL/uL (ref 3.87–5.11)
RDW: 15.3 % (ref 11.5–15.5)
WBC: 6.6 10*3/uL (ref 4.0–10.5)
nRBC: 0 % (ref 0.0–0.2)

## 2017-11-21 LAB — POCT I-STAT 4, (NA,K, GLUC, HGB,HCT)
Glucose, Bld: 141 mg/dL — ABNORMAL HIGH (ref 70–99)
HEMATOCRIT: 40 % (ref 36.0–46.0)
Hemoglobin: 13.6 g/dL (ref 12.0–15.0)
Potassium: 2.4 mmol/L — CL (ref 3.5–5.1)
SODIUM: 135 mmol/L (ref 135–145)

## 2017-11-21 LAB — BASIC METABOLIC PANEL
Anion gap: 16 — ABNORMAL HIGH (ref 5–15)
BUN: 9 mg/dL (ref 6–20)
CALCIUM: 9.1 mg/dL (ref 8.9–10.3)
CO2: 32 mmol/L (ref 22–32)
CREATININE: 0.59 mg/dL (ref 0.44–1.00)
Chloride: 91 mmol/L — ABNORMAL LOW (ref 98–111)
GFR calc non Af Amer: >60 mL/min (ref >60)
Glucose, Bld: 126 mg/dL — ABNORMAL HIGH (ref 70–99)
Potassium: 2.7 mmol/L — CL (ref 3.5–5.1)
SODIUM: 139 mmol/L (ref 135–145)

## 2017-11-21 LAB — MAGNESIUM: Magnesium: 2 mg/dL (ref 1.7–2.4)

## 2017-11-21 SURGERY — ESOPHAGOGASTRODUODENOSCOPY (EGD) WITH PROPOFOL
Anesthesia: General

## 2017-11-21 MED ORDER — POTASSIUM CHLORIDE CRYS ER 20 MEQ PO TBCR
20.0000 meq | EXTENDED_RELEASE_TABLET | Freq: Three times a day (TID) | ORAL | Status: DC
  Filled 2017-11-21 (×3): qty 1

## 2017-11-21 MED ORDER — ONDANSETRON HCL 4 MG PO TABS: 4.0000 mg | ORAL_TABLET | Freq: Four times a day (QID) | ORAL | Status: DC | PRN

## 2017-11-21 MED ORDER — ATORVASTATIN CALCIUM 20 MG PO TABS
20.0000 mg | ORAL_TABLET | Freq: Every day | ORAL | Status: DC
  Filled 2017-11-21 (×2): qty 1

## 2017-11-21 MED ORDER — TIOTROPIUM BROMIDE MONOHYDRATE 18 MCG IN CAPS
18.0000 ug | ORAL_CAPSULE | Freq: Every day | RESPIRATORY_TRACT | Status: DC | PRN
  Filled 2017-11-21: qty 5

## 2017-11-21 MED ORDER — OXYCODONE HCL 5 MG PO TABS
20.0000 mg | ORAL_TABLET | Freq: Three times a day (TID) | ORAL | Status: DC
  Administered 2017-11-21 – 2017-11-22 (×3): 20 mg via ORAL
  Filled 2017-11-21 (×4): qty 4

## 2017-11-21 MED ORDER — SODIUM CHLORIDE 0.9 % IV SOLN
INTRAVENOUS | Status: DC
  Administered 2017-11-21: 13:00:00 via INTRAVENOUS

## 2017-11-21 MED ORDER — ESTROGENS CONJUGATED 1.25 MG PO TABS
1.2500 mg | ORAL_TABLET | Freq: Every day | ORAL | Status: DC
  Administered 2017-11-22: 1.25 mg via ORAL
  Filled 2017-11-21 (×3): qty 1

## 2017-11-21 MED ORDER — QUETIAPINE FUMARATE 100 MG PO TABS
100.0000 mg | ORAL_TABLET | Freq: Every day | ORAL | Status: DC
  Filled 2017-11-21 (×2): qty 1

## 2017-11-21 MED ORDER — ALPRAZOLAM 1 MG PO TABS
2.0000 mg | ORAL_TABLET | Freq: Three times a day (TID) | ORAL | Status: DC
  Administered 2017-11-21 – 2017-11-22 (×2): 2 mg via ORAL
  Filled 2017-11-21 (×4): qty 2

## 2017-11-21 MED ORDER — FLUOXETINE HCL 20 MG PO CAPS
40.0000 mg | ORAL_CAPSULE | Freq: Every day | ORAL | Status: DC
  Filled 2017-11-21 (×4): qty 2

## 2017-11-21 MED ORDER — ACETAMINOPHEN 650 MG RE SUPP: 650.0000 mg | Freq: Four times a day (QID) | RECTAL | Status: DC | PRN

## 2017-11-21 MED ORDER — SUCRALFATE 1 G PO TABS
1.0000 g | ORAL_TABLET | Freq: Four times a day (QID) | ORAL | Status: DC
  Filled 2017-11-21 (×3): qty 1

## 2017-11-21 MED ORDER — ACETAMINOPHEN 325 MG PO TABS: 650.0000 mg | ORAL_TABLET | Freq: Four times a day (QID) | ORAL | Status: DC | PRN

## 2017-11-21 MED ORDER — PANTOPRAZOLE SODIUM 40 MG PO TBEC
40.0000 mg | DELAYED_RELEASE_TABLET | Freq: Every day | ORAL | Status: DC
  Filled 2017-11-21: qty 1

## 2017-11-21 MED ORDER — ENOXAPARIN SODIUM 40 MG/0.4ML ~~LOC~~ SOLN
40.0000 mg | SUBCUTANEOUS | Status: DC
  Filled 2017-11-21: qty 0.4

## 2017-11-21 MED ORDER — ALBUTEROL SULFATE (2.5 MG/3ML) 0.083% IN NEBU: 2.5000 mg | INHALATION_SOLUTION | Freq: Four times a day (QID) | RESPIRATORY_TRACT | Status: DC | PRN

## 2017-11-21 MED ORDER — METOCLOPRAMIDE HCL 5 MG/ML IJ SOLN
10.0000 mg | Freq: Four times a day (QID) | INTRAMUSCULAR | Status: DC
  Administered 2017-11-21 – 2017-11-23 (×8): 10 mg via INTRAVENOUS
  Filled 2017-11-21 (×8): qty 2

## 2017-11-21 MED ORDER — POTASSIUM CHLORIDE IN NACL 20-0.9 MEQ/L-% IV SOLN
INTRAVENOUS | Status: DC
  Administered 2017-11-21 – 2017-11-23 (×4): via INTRAVENOUS
  Filled 2017-11-21 (×8): qty 1000

## 2017-11-21 MED ORDER — ONDANSETRON HCL 4 MG/2ML IJ SOLN
4.0000 mg | Freq: Four times a day (QID) | INTRAMUSCULAR | Status: DC | PRN
  Administered 2017-11-22: 4 mg via INTRAVENOUS
  Filled 2017-11-21: qty 2

## 2017-11-21 NOTE — H&P (Signed)
[employer] - [place] [facility]    PATIENT NAME: Debbie Gay Brentlinger    MR#:  [id number]  DATE OF BIRTH:  01-10-1970  DATE OF ADMISSION:  11/21/2017  PRIMARY CARE PHYSICIAN: Nicolasa DuckingGarlick, William, MD   REQUESTING/REFERRING PHYSICIAN: Dr. Norma Fredricksonoledo.   CHIEF COMPLAINT:  No chief complaint on file.   HISTORY OF PRESENT ILLNESS:  Debbie Gay Dinh  is a 47 y.o. female with a known history of bipolar disorder, chronic pain, COPD, GERD, history of previous MI, panic attacks, asthma who was admitted directly from the gastroenterologist office due to intractable nausea vomiting and hypokalemia.  Patient says she is been having intractable nausea vomiting ongoing for the past 2 to 3 weeks and was referred by her primary care physician to gastroenterology.  She has a previous history of peptic ulcer disease and plan was for a upper GI endoscopy today but patient had blood work done at his office and was noted to be severely hypokalemic with a potassium of 2.4.  She was sent to the hospital for direct admission.  Patient admits to vomiting which is bilious and nonbloody in nature, she admits to some vague abdominal pain but no diarrhea.  Patient denies any chest pains, shortness of breath, fever chills cough or any other associated symptoms presently.  PAST MEDICAL HISTORY:   Past Medical History:  Diagnosis Date  . Asthma   . Bipolar 1 disorder (HCC)   . Chronic pain   . COPD (chronic obstructive pulmonary disease) (HCC)   . GERD (gastroesophageal reflux disease)   . History of heart attack   . Myocardial infarction (HCC)   . Panic attacks     PAST SURGICAL HISTORY:   Past Surgical History:  Procedure Laterality Date  . ABDOMINAL HYSTERECTOMY    . COLONOSCOPY WITH PROPOFOL N/A 10/30/2014   Procedure: COLONOSCOPY WITH PROPOFOL;  Surgeon: Christena DeemMartin U Skulskie, MD;  Location: [facility] ENDOSCOPY;  Service: Endoscopy;  Laterality: N/A;  . ESOPHAGOGASTRODUODENOSCOPY (EGD) WITH PROPOFOL N/A  10/30/2014   Procedure: ESOPHAGOGASTRODUODENOSCOPY (EGD) WITH PROPOFOL;  Surgeon: Christena DeemMartin U Skulskie, MD;  [facility] END[facility]COPY;  Service: Endoscopy;  Laterality: N/A;    SOCIAL HISTORY:   Social History   Tobacco Use  . Smoking status: Current Every Day Smoker    Packs/day: 0.50    Years: 20.00    Pack years: 10.00  . Smokeless tobacco: Never Used  Substance Use Topics  . Alcohol use: No    FAMILY HISTORY:   Family History  Problem Relation Age of Onset  . COPD Mother   . Irritable bowel syndrome Mother   . Glaucoma Father   . Hyperlipidemia Father     DRUG ALLERGIES:   Allergies  Allergen Reactions  . Levaquin [Levofloxacin] Swelling  . Prednisone Shortness Of Breath and Swelling  . Penicillins     REVIEW OF SYSTEMS:   Review of Systems  Constitutional: Negative for fever and weight loss.  HENT: Negative for congestion, nosebleeds and tinnitus.   Eyes: Negative for blurred vision, double vision and redness.  Respiratory: Negative for cough, hemoptysis and shortness of breath.   Cardiovascular: Negative for chest pain, orthopnea, leg swelling and PND.  Gastrointestinal: Positive for nausea and vomiting. Negative for abdominal pain, diarrhea and melena.  Genitourinary: Negative for dysuria, hematuria and urgency.  Musculoskeletal: Negative for falls and joint pain.  Neurological: Negative for dizziness, tingling, sensory change, focal weakness, seizures, weakness and headaches.  Endo/Heme/Allergies: Negative for polydipsia. Does not bruise/bleed easily.  Psychiatric/Behavioral: Negative for  depression and memory loss. The patient is not nervous/anxious.     MEDICATIONS AT HOME:   Prior to Admission medications   Medication Sig Start Date End Date Taking? Authorizing Provider  clonazePAM (KLONOPIN) 2 MG tablet Take 2 mg by mouth 2 (two) times daily. May take 1/2 a tablet as needed.   Yes [provider]  FLUoxetine (PROZAC) 40 MG capsule Take 40 mg  by mouth daily.   Yes [provider]  Oxycodone HCl 10 MG TABS Take 20 mg by mouth 3 (three) times daily.   Yes [provider]  pantoprazole (PROTONIX) 40 MG tablet Take 1 tablet (40 mg total) by mouth daily. 10/16/14 11/21/17 Yes Paduchowski, Caryn Bee, MD  QUEtiapine (SEROQUEL) 100 MG tablet Take 100-200 mg by mouth at bedtime.   Yes [provider]      VITAL SIGNS:  Blood pressure 102/61, pulse 90, temperature 97.7 F (36.5 C), temperature source Oral, resp. rate 16, height 5\' 6"  (1.676 m), weight 59.1 kg, SpO2 95 %.  PHYSICAL EXAMINATION:  Physical Exam  GENERAL:  47 y.o.-year-old patient lying in the bed in no acute distress.  EYES: Pupils equal, round, reactive to light and accommodation. No scleral icterus. Extraocular muscles intact.  HEENT: Head atraumatic, normocephalic. Oropharynx and nasopharynx clear. No oropharyngeal erythema, moist oral mucosa  NECK:  Supple, no jugular venous distention. No thyroid enlargement, no tenderness.  LUNGS: Normal breath sounds bilaterally, no wheezing, rales, rhonchi. No use of accessory muscles of respiration.  CARDIOVASCULAR: S1, S2 RRR. No murmurs, rubs, gallops, clicks.  ABDOMEN: Soft, nontender, nondistended. Bowel sounds present. No organomegaly or mass.  EXTREMITIES: No pedal edema, cyanosis, or clubbing. + 2 pedal & radial pulses b/l.   NEUROLOGIC: Cranial nerves II through XII are intact. No focal Motor or sensory deficits appreciated b/l PSYCHIATRIC: The patient is alert and oriented x 3.  SKIN: No obvious rash, lesion, or ulcer.   LABORATORY PANEL:   CBC Recent Labs  Lab 11/21/17 1418  WBC 6.6  HGB 13.2  HCT 38.9  PLT 293   ------------------------------------------------------------------------------------------------------------------  Chemistries  Recent Labs  Lab 11/21/17 1418  NA 139  K 2.7*  CL 91*  CO2 32  GLUCOSE 126*  BUN 9  CREATININE 0.59  CALCIUM 9.1  MG 2.0    ------------------------------------------------------------------------------------------------------------------  Cardiac Enzymes Recent Labs  Lab 11/20/17 1015  TROPONINI <0.03   ------------------------------------------------------------------------------------------------------------------  RADIOLOGY:  No results found.   IMPRESSION AND PLAN:   47 year old female with past medical history of bipolar disorder, chronic pain, GERD, history of peptic ulcer disease, COPD, who presents to the hospital due to intractable nausea vomiting and noted to be hypokalemic.  1.  Intractable nausea and vomiting-etiology unclear presently.  Patient has been having the symptoms now for the past 3 weeks.  She was seen in the emergency room at [facility] a few weeks back and had a negative abdominal CT. -She was referred to gastroenterology and plan was for upper GI endoscopy but prior to the procedure patient was noted to be hypokalemic and therefore the procedure was canceled. - Continue supportive care with IV fluids, antiemetics.  Gastroenterology consult placed, Dr. Norma Fredrickson to see the patient, plan for possible endoscopy tomorrow if potassium level has improved.  2.  Hypokalemia-secondary to the intractable nausea vomiting.  Will replace orally and intravenously. - Repeat level in the morning, patient's magnesium level is normal.  3.  GERD-continue Protonix.  4.  History of bipolar disorder-continue Seroquel.  5.  Anxiety-continue  Xanax as needed.  6.  COPD-no acute exacerbation.  Continue Spiriva, albuterol inhaler as needed.  7.  Hyperlipidemia-continue atorvastatin.  8.  Chronic pain-continue patient's oxycodone.  Patient has high drug-seeking behavior and would avoid giving her IV narcotics for now.    All the records are reviewed and case discussed with ED provider. Management plans discussed with the patient, family and they are in agreement.  CODE STATUS: Full code  TOTAL TIME  TAKING CARE OF THIS PATIENT: 45 minutes.    Houston Siren M.D on 11/21/2017 at 2:45 PM  Between 7am to 6pm - Pager - [phone]  After 6pm go to [email or web address] - password [email or web address] [facility]  [place] [facility]  Office  [phone]  CC: Primary care physician; [name], MD

## 2017-11-21 NOTE — Consult Note (Signed)
[facility] GI Inpatient Consult Note   Jamey Reas, M.D.  Reason for Consult: Intractable nausea and vomiting   Attending Requesting Consult: Hilda Lias, M.D.  Outpatient Primary Physician: [name], M.D.  History of Present Illness: Debbie Gay is a 47 y.o. female with a history of opiate dependence and history of peptic ulcer disease on upper endoscopy and 10/30/2014.  She has been having intractable symptoms of nausea vomiting for the last several days and was seen yesterday in our office by Sharlotte Alamo, nurse practitioner.  She was scheduled for elective upper endoscopy today but was found to be hypokalemic even after attempted supplementation with potassium.  Potassium at the time of hospital admission was 2.4.  She denies NSAIDs but states continues to take low doses of narcotics due to previous episodes of withdrawal.  She also takes Seroquel and Xanax.  Labs obtained yesterday at [facility] gastroenterology revealed a potassium of 2.6 and other indices of the chemical profile were normal including normal creatinine and liver associated enzymes.Hemoglobin is normal at 14.2 along with a WBC of 5.5, MCV 102 RDW 15.4 CT scan of the abdomen per performed on 10/31/2017 showed no acute intra-abdominal process.  She is taken Phenergan without significant relief.  She reported yesterday in the office that she was taken metoclopramide although it was unclear who prescribed the medication; metoclopramide appeared to improve her nausea and vomiting to some degree.  Past Medical History:  Past Medical History:  Diagnosis Date  . Asthma   . Bipolar 1 disorder (HCC)   . Chronic pain   . COPD (chronic obstructive pulmonary disease) (HCC)   . GERD (gastroesophageal reflux disease)   . History of heart attack   . Myocardial infarction (HCC)   . Panic attacks     Problem List: There are no active problems to display for this patient.   Past Surgical History: Past  Surgical History:  Procedure Laterality Date  . ABDOMINAL HYSTERECTOMY    . COLONOSCOPY WITH PROPOFOL N/A 10/30/2014   Procedure: COLONOSCOPY WITH PROPOFOL;  Surgeon: Christena Deem, MD;  Location: [facility] ENDOSCOPY;  Service: Endoscopy;  Laterality: N/A;  . ESOPHAGOGASTRODUODENOSCOPY (EGD) WITH PROPOFOL N/A 10/30/2014   Procedure: ESOPHAGOGASTRODUODENOSCOPY (EGD) WITH PROPOFOL;  Surgeon: Christena Deem, MD;  Location: [facility] ENDOSCOPY;  Service: Endoscopy;  Laterality: N/A;    Allergies: Allergies  Allergen Reactions  . Levaquin [Levofloxacin] Swelling  . Prednisone Shortness Of Breath and Swelling  . Penicillins     Home Medications: Medications Prior to Admission  Medication Sig Dispense Refill Last Dose  . clonazePAM (KLONOPIN) 2 MG tablet Take 2 mg by mouth 2 (two) times daily. May take 1/2 a tablet as needed.   Past Week at Unknown time  . FLUoxetine (PROZAC) 40 MG capsule Take 40 mg by mouth daily.   Past Week at Unknown time  . Oxycodone HCl 10 MG TABS Take 20 mg by mouth 3 (three) times daily.   11/21/2017 at Unknown time  . pantoprazole (PROTONIX) 40 MG tablet Take 1 tablet (40 mg total) by mouth daily. 30 tablet 1 11/20/2017 at Unknown time  . QUEtiapine (SEROQUEL) 100 MG tablet Take 100-200 mg by mouth at bedtime.   Past Week at Unknown time  . fluticasone (FLONASE) 50 MCG/ACT nasal spray Place 2 sprays into both nostrils daily.   Not Taking at Unknown time  . gabapentin (NEURONTIN) 300 MG capsule Take 600 mg by mouth at bedtime.   Not Taking at Unknown time  .  omeprazole (PRILOSEC) 40 MG capsule Take 40 mg by mouth daily.   Not Taking at Unknown time  . sulfamethoxazole-trimethoprim (BACTRIM DS,SEPTRA DS) 800-160 MG tablet Take 1 tablet by mouth 2 (two) times daily. (Patient not taking: Reported on 11/21/2017) 20 tablet 0 Completed Course at Unknown time   Home medication reconciliation was completed with the patient.   Scheduled Inpatient Medications:    Continuous  Inpatient Infusions:   . sodium chloride      PRN Inpatient Medications:    Family History: family history is not on file.   GI Family History: zNeg  Social History:   reports that she has been smoking. She has never used smokeless tobacco. She reports that she does not drink alcohol or use drugs. The patient denies ETOH, tobacco, or drug use.    Review of Systems: Review of Systems - Negative except HPI  Physical Examination: LMP  (LMP Unknown)  Physical Exam  Constitutional: She is oriented to person, place, and time. She appears well-developed. She appears distressed.  HENT:  Head: Normocephalic and atraumatic.  Eyes: Pupils are equal, round, and reactive to light.  Neck: Normal range of motion.  Cardiovascular: Normal rate and regular rhythm.  Pulmonary/Chest: Effort normal and breath sounds normal.  Abdominal: Soft. She exhibits distension. She exhibits no mass. There is tenderness. There is no rebound. No hernia.  Musculoskeletal: Normal range of motion.  Neurological: She is alert and oriented to person, place, and time.  Skin: Skin is warm and dry. Capillary refill takes less than 2 seconds.  Psychiatric: Her behavior is normal.    Data: Lab Results  Component Value Date   WBC 16.9 (H) 07/18/2015   HGB 12.7 07/18/2015   HCT 37.6 07/18/2015   MCV 96.0 07/18/2015   PLT 196 07/18/2015   No results for input(s): HGB in the last 168 hours. Lab Results  Component Value Date   NA 137 07/18/2015   K 3.7 07/18/2015   CL 102 07/18/2015   CO2 27 07/18/2015   BUN 20 07/18/2015   CREATININE 0.77 07/18/2015   Lab Results  Component Value Date   ALT 83 (H) 07/18/2015   AST 135 (H) 07/18/2015   ALKPHOS 67 07/18/2015   BILITOT 0.6 07/18/2015   No results for input(s): APTT, INR, PTT in the last 168 hours. CBC Latest Ref Rng & Units 07/18/2015 10/16/2014 08/19/2012  WBC 3.6 - 11.0 K/uL 16.9(H) 7.2 9.0  Hemoglobin 12.0 - 16.0 g/dL 40.912.7 11.7(L) 13.6  Hematocrit  35.0 - 47.0 % 37.6 35.6 39.6  Platelets 150 - 440 K/uL 196 296 270    STUDIES: No results found. @IMAGES @  Assessment: 1.  Intractable nausea vomiting.  Differential diagnosis includes narcotic side effect, gastric outlet  obstruction secondary to peptic ulcer disease and/or mass, central nervous system lesion. 2.  History of peptic ulcer disease. 3.  History of colonic diverticulosis and colon polyps. 4.  History of H. pylori gastritis.  Over 28 2016.  Eradicated with follow-up H. pylori testing negative. 5.  Hypokalemia  Recommendations: 1.  Admit to hospital 2.  IV acid suppression, clear liquids as tolerated, antiemetics. 3.  IV potassium supplementation. 4.  EGD when clinically feasible.   5.  Will follow.  Appreciate Dr. Hilbert OdorSainani's admission and care of medical issues.   Thank you for the consult. Please call with questions or concerns.  Rosina Lowensteinoledo, Teodoro, "Mellody DanceKeith" MD [facility] [address] [place], [place] [address] [phone]  11/21/2017 12:13 PM

## 2017-11-21 NOTE — Progress Notes (Signed)
Pt transferred to unit via stretcher. Report given to nurse. Family received all personal belongings.

## 2017-11-22 ENCOUNTER — Encounter
Admission: RE | Disposition: A | Discharge status: Home / Self Care | Payer: Self-pay | Source: Ambulatory Visit | Attending: Internal Medicine

## 2017-11-22 ENCOUNTER — Encounter: Payer: Self-pay | Admitting: Anesthesiology

## 2017-11-22 DIAGNOSIS — Z83511 Family history of glaucoma: Secondary | ICD-10-CM | POA: Diagnosis not present

## 2017-11-22 DIAGNOSIS — F17210 Nicotine dependence, cigarettes, uncomplicated: Secondary | ICD-10-CM | POA: Diagnosis present

## 2017-11-22 DIAGNOSIS — F410 Panic disorder [episodic paroxysmal anxiety] without agoraphobia: Secondary | ICD-10-CM | POA: Diagnosis present

## 2017-11-22 DIAGNOSIS — Z880 Allergy status to penicillin: Secondary | ICD-10-CM | POA: Diagnosis not present

## 2017-11-22 DIAGNOSIS — I252 Old myocardial infarction: Secondary | ICD-10-CM | POA: Diagnosis not present

## 2017-11-22 DIAGNOSIS — Z90710 Acquired absence of both cervix and uterus: Secondary | ICD-10-CM | POA: Diagnosis not present

## 2017-11-22 DIAGNOSIS — Z8711 Personal history of peptic ulcer disease: Secondary | ICD-10-CM | POA: Diagnosis not present

## 2017-11-22 DIAGNOSIS — G8929 Other chronic pain: Secondary | ICD-10-CM | POA: Diagnosis present

## 2017-11-22 DIAGNOSIS — K315 Obstruction of duodenum: Secondary | ICD-10-CM | POA: Diagnosis present

## 2017-11-22 DIAGNOSIS — E785 Hyperlipidemia, unspecified: Secondary | ICD-10-CM | POA: Diagnosis present

## 2017-11-22 DIAGNOSIS — E876 Hypokalemia: Secondary | ICD-10-CM | POA: Diagnosis present

## 2017-11-22 DIAGNOSIS — Z79899 Other long term (current) drug therapy: Secondary | ICD-10-CM | POA: Diagnosis not present

## 2017-11-22 DIAGNOSIS — K219 Gastro-esophageal reflux disease without esophagitis: Secondary | ICD-10-CM | POA: Diagnosis present

## 2017-11-22 DIAGNOSIS — K2950 Unspecified chronic gastritis without bleeding: Secondary | ICD-10-CM | POA: Diagnosis present

## 2017-11-22 DIAGNOSIS — F319 Bipolar disorder, unspecified: Secondary | ICD-10-CM | POA: Diagnosis present

## 2017-11-22 DIAGNOSIS — Z881 Allergy status to other antibiotic agents status: Secondary | ICD-10-CM | POA: Diagnosis not present

## 2017-11-22 DIAGNOSIS — Z8349 Family history of other endocrine, nutritional and metabolic diseases: Secondary | ICD-10-CM | POA: Diagnosis not present

## 2017-11-22 DIAGNOSIS — J449 Chronic obstructive pulmonary disease, unspecified: Secondary | ICD-10-CM | POA: Diagnosis present

## 2017-11-22 DIAGNOSIS — Z8619 Personal history of other infectious and parasitic diseases: Secondary | ICD-10-CM | POA: Diagnosis not present

## 2017-11-22 DIAGNOSIS — Z8719 Personal history of other diseases of the digestive system: Secondary | ICD-10-CM | POA: Diagnosis not present

## 2017-11-22 DIAGNOSIS — Z79891 Long term (current) use of opiate analgesic: Secondary | ICD-10-CM | POA: Diagnosis not present

## 2017-11-22 DIAGNOSIS — Z888 Allergy status to other drugs, medicaments and biological substances status: Secondary | ICD-10-CM | POA: Diagnosis not present

## 2017-11-22 DIAGNOSIS — Z825 Family history of asthma and other chronic lower respiratory diseases: Secondary | ICD-10-CM | POA: Diagnosis not present

## 2017-11-22 DIAGNOSIS — Z86010 Personal history of colonic polyps: Secondary | ICD-10-CM | POA: Diagnosis not present

## 2017-11-22 LAB — CBC
HCT: 36.2 % (ref 36.0–46.0)
Hemoglobin: 12 g/dL (ref 12.0–15.0)
MCH: 33.6 pg (ref 26.0–34.0)
MCHC: 33.1 g/dL (ref 30.0–36.0)
MCV: 101.4 fL — ABNORMAL HIGH (ref 80.0–100.0)
NRBC: 0 % (ref 0.0–0.2)
Platelets: 281 10*3/uL (ref 150–400)
RBC: 3.57 MIL/uL — AB (ref 3.87–5.11)
RDW: 15.6 % — AB (ref 11.5–15.5)
WBC: 5.8 10*3/uL (ref 4.0–10.5)

## 2017-11-22 LAB — BASIC METABOLIC PANEL
ANION GAP: 9 (ref 5–15)
BUN: 11 mg/dL (ref 6–20)
CALCIUM: 8.4 mg/dL — AB (ref 8.9–10.3)
CO2: 33 mmol/L — ABNORMAL HIGH (ref 22–32)
CREATININE: 0.56 mg/dL (ref 0.44–1.00)
Chloride: 99 mmol/L (ref 98–111)
Glucose, Bld: 115 mg/dL — ABNORMAL HIGH (ref 70–99)
Potassium: 2.6 mmol/L — CL (ref 3.5–5.1)
SODIUM: 141 mmol/L (ref 135–145)

## 2017-11-22 LAB — MAGNESIUM: MAGNESIUM: 1.8 mg/dL (ref 1.7–2.4)

## 2017-11-22 LAB — POTASSIUM: Potassium: 3.4 mmol/L — ABNORMAL LOW (ref 3.5–5.1)

## 2017-11-22 LAB — HIV ANTIBODY (ROUTINE TESTING W REFLEX): HIV SCREEN 4TH GENERATION: NONREACTIVE

## 2017-11-22 SURGERY — ESOPHAGOGASTRODUODENOSCOPY (EGD) WITH PROPOFOL
Anesthesia: Monitor Anesthesia Care

## 2017-11-22 MED ORDER — ONDANSETRON HCL 4 MG/2ML IJ SOLN
4.0000 mg | Freq: Four times a day (QID) | INTRAMUSCULAR | Status: DC
  Administered 2017-11-22 – 2017-11-23 (×3): 4 mg via INTRAVENOUS
  Filled 2017-11-22 (×3): qty 2

## 2017-11-22 MED ORDER — POTASSIUM CHLORIDE 10 MEQ/100ML IV SOLN
10.0000 meq | INTRAVENOUS | Status: AC
  Administered 2017-11-22 (×6): 10 meq via INTRAVENOUS
  Filled 2017-11-22 (×4): qty 100

## 2017-11-22 MED ORDER — QUETIAPINE FUMARATE 200 MG PO TABS
200.0000 mg | ORAL_TABLET | Freq: Every day | ORAL | Status: DC
  Administered 2017-11-22: 21:00:00 200 mg via ORAL
  Filled 2017-11-22 (×2): qty 1

## 2017-11-22 MED ORDER — MAGNESIUM SULFATE 2 GM/50ML IV SOLN
2.0000 g | Freq: Once | INTRAVENOUS | Status: AC
  Administered 2017-11-22: 2 g via INTRAVENOUS
  Filled 2017-11-22: qty 50

## 2017-11-22 MED ORDER — SODIUM CHLORIDE 0.9 % IV SOLN
INTRAVENOUS | Status: DC
  Administered 2017-11-23: 12:00:00 via INTRAVENOUS

## 2017-11-22 MED ORDER — PROMETHAZINE HCL 25 MG/ML IJ SOLN
25.0000 mg | Freq: Four times a day (QID) | INTRAMUSCULAR | Status: DC | PRN
  Filled 2017-11-22: qty 1

## 2017-11-22 NOTE — Progress Notes (Signed)
Paged dr Norma Fredricksontoledo. Pt was suppose to have egdthis pm. We were waiting to hear from endo.  We called endo several times  But no answer. Dr Norma Fredricksontoledo said pt would have  The egd tomorrow .  He said  There was no anesthesia and  Endo was full. I told him no one had called the pt or staff to inform  [place] or pt  That she would not have the egd today.

## 2017-11-22 NOTE — Plan of Care (Signed)
  Problem: Education: Goal: Knowledge of General Education information will improve Description Including pain rating scale, medication(s)/side effects and non-pharmacologic comfort measures Outcome: Progressing   Problem: Health Behavior/Discharge Planning: Goal: Ability to manage health-related needs will improve Outcome: Progressing   Problem: Clinical Measurements: Goal: Ability to maintain clinical measurements within normal limits will improve Outcome: Progressing Goal: Will remain free from infection Outcome: Progressing Goal: Diagnostic test results will improve Outcome: Progressing Note:  Received 6 bolus bags of k  k level 3.4  Goal: Respiratory complications will improve Outcome: Progressing Goal: Cardiovascular complication will be avoided Outcome: Progressing   Problem: Activity: Goal: Risk for activity intolerance will decrease Outcome: Progressing Note:  bsc   Problem: Coping: Goal: Level of anxiety will decrease Outcome: Progressing Note:  On xanax   Problem: Elimination: Goal: Will not experience complications related to bowel motility Outcome: Progressing   Problem: Pain Managment: Goal: General experience of comfort will improve Outcome: Progressing   Problem: Safety: Goal: Ability to remain free from injury will improve Outcome: Progressing   Problem: Skin Integrity: Goal: Risk for impaired skin integrity will decrease Outcome: Progressing

## 2017-11-22 NOTE — Consult Note (Signed)
PHARMACY CONSULT NOTE - FOLLOW UP  Pharmacy Consult for Electrolyte Monitoring and Replacement   Recent Labs: Potassium (mmol/L)  Date Value  11/22/2017 3.4 (L)  08/19/2012 4.3   Magnesium (mg/dL)  Date Value  16/10/960411/21/2019 1.8  04/02/2011 1.8   Calcium (mg/dL)  Date Value  54/09/811911/21/2019 8.4 (L)   Calcium, Total (mg/dL)  Date Value  14/78/295608/18/2014 8.7   Albumin (g/dL)  Date Value  21/30/865707/16/2017 3.8  08/19/2012 3.2 (L)  ]   Assessment: K-3.4 Mg - 1.8  Goal of Therapy:  Potassium ~4, Mg ~ 2  Plan:  Patient is ordered KCl PO 20 mEq TID therefore with level of 3.4 will hold on additional replacement at this time.  Will recheck with monring labs  Will order Magnesium Sulf 2 gm IV once and recheck level with morning labs.  Orinda Kennerhris A Eldean Klatt ,PharmD Clinical Pharmacist 11/22/2017 3:42 PM

## 2017-11-22 NOTE — Progress Notes (Signed)
[employer] -  at [facility]                                                                                                                                                                                  Patient Demographics   Debbie Gay, is a 47 y.o. female, DOB - [birth year], ZOX:[id number]  Admit date - 11/21/2017   Admitting Physician [name], MD  Outpatient Primary MD for the patient is [name], MD   LOS - 1  Subjective: Patient continues to be nauseous and throwing up.  Potassium very low.    Review of Systems:   CONSTITUTIONAL: No documented fever. No fatigue, weakness. No weight gain, no weight loss.  EYES: No blurry or double vision.  ENT: No tinnitus. No postnasal drip. No redness of the oropharynx.  RESPIRATORY: No cough, no wheeze, no hemoptysis. No dyspnea.  CARDIOVASCULAR: No chest pain. No orthopnea. No palpitations. No syncope.  GASTROINTESTINAL: Positive nausea, positive vomiting or no diarrhea. No abdominal pain. No melena or hematochezia.  GENITOURINARY: No dysuria or hematuria.  ENDOCRINE: No polyuria or nocturia. No heat or cold intolerance.  HEMATOLOGY: No anemia. No bruising. No bleeding.  INTEGUMENTARY: No rashes. No lesions.  MUSCULOSKELETAL: No arthritis. No swelling. No gout.  NEUROLOGIC: No numbness, tingling, or ataxia. No seizure-type activity.  PSYCHIATRIC: No anxiety. No insomnia. No ADD.    Vitals:   Vitals:   11/21/17 1939 11/21/17 1944 11/22/17 0441 11/22/17 0445  BP: (!) 89/58 (!) 90/57 (!) 84/50 93/61  Pulse: 93 92 92 90  Resp: 19  17   Temp: 98.4 F (36.9 C)  98.4 F (36.9 C)   TempSrc: Oral  Oral   SpO2: 93%  (!) 85% 95%  Weight:      Height:        Wt Readings from Last 3 Encounters:  11/21/17 59.1 kg  10/21/16 63.5 kg  07/18/15 45.4 kg     Intake/Output Summary (Last 24 hours) at 11/22/2017 1340 Last data filed at 11/22/2017 0456 Gross per 24 hour  Intake 1268.91 ml  Output  650 ml  Net 618.91 ml    Physical Exam:   GENERAL: Pleasant-appearing in no apparent distress.  HEAD, EYES, EARS, NOSE AND THROAT: Atraumatic, normocephalic. Extraocular muscles are intact. Pupils equal and reactive to light. Sclerae anicteric. No conjunctival injection. No oro-pharyngeal erythema.  NECK: Supple. There is no jugular venous distention. No bruits, no lymphadenopathy, no thyromegaly.  HEART: Regular rate and rhythm,. No murmurs, no rubs, no clicks.  LUNGS: Clear to auscultation bilaterally. No rales or rhonchi. No wheezes.  ABDOMEN: Soft, flat, nontender, nondistended. Has good bowel sounds. No hepatosplenomegaly appreciated.  EXTREMITIES: No evidence of any cyanosis, clubbing, or peripheral edema.  +2 pedal and radial pulses bilaterally.  NEUROLOGIC: The patient is alert, awake, and oriented x3 with no focal motor or sensory deficits appreciated bilaterally.  SKIN: Moist and warm with no rashes appreciated.  Psych: Not anxious, depressed LN: No inguinal LN enlargement    Antibiotics   Anti-infectives (From admission, onward)   None      Medications   Scheduled Meds: . alprazolam  2 mg Oral TID  . atorvastatin  20 mg Oral Daily  . enoxaparin (LOVENOX) injection  40 mg Subcutaneous Q24H  . estrogens (conjugated)  1.25 mg Oral Daily  . FLUoxetine  40 mg Oral Daily  . metoCLOPramide (REGLAN) injection  10 mg Intravenous Q6H  . ondansetron (ZOFRAN) IV  4 mg Intravenous Q6H  . oxyCODONE  20 mg Oral TID  . pantoprazole  40 mg Oral Daily  . potassium chloride  20 mEq Oral TID  . QUEtiapine  100 mg Oral QHS  . sucralfate  1 g Oral QID   Continuous Infusions: . sodium chloride    . 0.9 % NaCl with KCl 20 mEq / L 100 mL/hr at 11/22/17 1115   PRN Meds:.acetaminophen **OR** acetaminophen, albuterol, promethazine, tiotropium   Data Review:   Micro Results No results found for this or any previous visit (from the past 240 hour(s)).  Radiology Reports No results  found.   CBC Recent Labs  Lab 11/21/17 1110 11/21/17 1418 11/22/17 0547  WBC  --  6.6 5.8  HGB 13.6 13.2 12.0  HCT 40.0 38.9 36.2  PLT  --  293 281  MCV  --  99.5 101.4*  MCH  --  33.8 33.6  MCHC  --  33.9 33.1  RDW  --  15.3 15.6*    Chemistries  Recent Labs  Lab 11/21/17 1110 11/21/17 1418 11/22/17 0547  NA 135 139 141  K 2.4* 2.7* 2.6*  CL  --  91* 99  CO2  --  32 33*  GLUCOSE 141* 126* 115*  BUN  --  9 11  CREATININE  --  0.59 0.56  CALCIUM  --  9.1 8.4*  MG  --  2.0  --    ------------------------------------------------------------------------------------------------------------------ estimated creatinine clearance is 82.9 mL/min (by C-G formula based on SCr of 0.56 mg/dL). ------------------------------------------------------------------------------------------------------------------ No results for input(s): HGBA1C in the last 72 hours. ------------------------------------------------------------------------------------------------------------------ No results for input(s): CHOL, HDL, LDLCALC, TRIG, CHOLHDL, LDLDIRECT in the last 72 hours. ------------------------------------------------------------------------------------------------------------------ No results for input(s): TSH, T4TOTAL, T3FREE, THYROIDAB in the last 72 hours.  Invalid input(s): FREET3 ------------------------------------------------------------------------------------------------------------------ No results for input(s): VITAMINB12, FOLATE, FERRITIN, TIBC, IRON, RETICCTPCT in the last 72 hours.  Coagulation profile No results for input(s): INR, PROTIME in the last 168 hours.  No results for input(s): DDIMER in the last 72 hours.  Cardiac Enzymes Recent Labs  Lab 11/20/17 1015  TROPONINI <0.03   ------------------------------------------------------------------------------------------------------------------ Invalid input(s): POCBNP    Assessment & Plan   [age] year old  female with past medical history of bipolar disorder, chronic pain, GERD, history of peptic ulcer disease, COPD, who presents to the hospital due to intractable nausea vomiting and noted to be hypokalemic.  1.  Intractable nausea and vomiting-etiology unclear presently.   I will place patient on scheduled Zofran continue scheduled Reglan  Use PRN Phenergan Plan for EGD later today if potassium is okay    2.  Hypokalemia-secondary to the intractable nausea vomiting.   Continues to be very low will  replace potassium   3.  GERD-continue Protonix.  4.  History of bipolar disorder-continue Seroquel.  5.  Anxiety-continue Xanax as needed.  6.  COPD-no acute exacerbation.  Continue Spiriva, albuterol inhaler as needed.  7.  Hyperlipidemia-continue atorvastatin.  8.  Chronic pain-continue patient's oxycodone.  Patient has high drug-seeking behavior and would avoid giving her IV narcotics for now.     Code Status Orders  (From admission, onward)         Start     Ordered   11/21/17 1402  Full code  Continuous     11/21/17 1405        Code Status History    This patient has a current code status but no historical code status.           Consults gastroenterology  DVT Prophylaxis  Lovenox  Lab Results  Component Value Date   PLT 281 11/22/2017     Time Spent in minutes   35 minutes greater than 50% of time spent in care coordination and counseling patient regarding the condition and plan of care.   Auburn Bilberry M.D on 11/22/2017 at 1:40 PM  Between 7am to 6pm - Pager - [phone]  After 6pm go to [email or web address] - Social research officer, government  [employer]   Office  [phone]

## 2017-11-22 NOTE — Consult Note (Addendum)
PHARMACY CONSULT NOTE - FOLLOW UP  Pharmacy Consult for Electrolyte Monitoring and Replacement   Recent Labs: Potassium (mmol/L)  Date Value  11/22/2017 2.6 (LL)  08/19/2012 4.3   Magnesium (mg/dL)  Date Value  01/02/725311/20/2019 2.0  04/02/2011 1.8   Calcium (mg/dL)  Date Value  66/44/034711/21/2019 8.4 (L)   Calcium, Total (mg/dL)  Date Value  42/59/563808/18/2014 8.7   Albumin (g/dL)  Date Value  75/64/332907/16/2017 3.8  08/19/2012 3.2 (L)  ]   Assessment: Patient is hypokalemic on IV and po potassium.  Currently on 6 KCl IV runs finishing at 1400  Goal of Therapy:  Potassium ~4, Mg ~ 2  Plan:  Will recheck potassium and magnesium at 1500 1 hour after finishing IV KCl and reassess electrolyte replacement needs  Debbie Gay ,PharmD Clinical Pharmacist 11/22/2017 10:43 AM

## 2017-11-22 NOTE — Progress Notes (Signed)
[facility] Inpatient Progress Note  Subjective: Patient seen for f/u nausea, vomiting, abdominal pain.  Patient currently wishes to have upper endoscopy, although she feels  better and has less pain today.  Objective: Vital signs in last 24 hours: Temp:  [98.1 F (36.7 C)-98.4 F (36.9 C)] 98.1 F (36.7 C) (11/21 1355) Pulse Rate:  [90-96] 96 (11/21 1355) Resp:  [17-20] 20 (11/21 1355) BP: (84-121)/(50-85) 121/85 (11/21 1355) SpO2:  [85 %-95 %] 90 % (11/21 1355) Blood pressure 121/85, pulse 96, temperature 98.1 F (36.7 C), temperature source Oral, resp. rate 20, height 5' 6" (1.676 m), weight 59.1 kg, SpO2 90 %.    Intake/Output from previous day: 11/20 0701 - 11/21 0700 In: 1268.9 [I.V.:1268.9] Out: 650 [Urine:650]  Intake/Output this shift: Total I/O In: 2050 [I.V.:1400; IV Piggyback:650] Out: -    General appearance: Alert no distress Resp: Clear to auscultation bilaterally Cardio: Regular rate no gallop GI: Soft, benign, mildly tender diffusely.  No masses.  Bowel sounds positive Extremities: No edema   Lab Results: Results for orders placed or performed during the hospital encounter of 11/21/17 (from the past 24 hour(s))  Basic metabolic panel     Status: Abnormal   Collection Time: 11/22/17  5:47 AM  Result Value Ref Range   Sodium 141 135 - 145 mmol/L   Potassium 2.6 (LL) 3.5 - 5.1 mmol/L   Chloride 99 98 - 111 mmol/L   CO2 33 (H) 22 - 32 mmol/L   Glucose, Bld 115 (H) 70 - 99 mg/dL   BUN 11 6 - 20 mg/dL   Creatinine, Ser 0.56 0.44 - 1.00 mg/dL   Calcium 8.4 (L) 8.9 - 10.3 mg/dL   GFR calc non Af Amer >60 >60 mL/min   GFR calc Af Amer >60 >60 mL/min   Anion gap 9 5 - 15  CBC     Status: Abnormal   Collection Time: 11/22/17  5:47 AM  Result Value Ref Range   WBC 5.8 4.0 - 10.5 K/uL   RBC 3.57 (L) 3.87 - 5.11 MIL/uL   Hemoglobin 12.0 12.0 - 15.0 g/dL   HCT 36.2 36.0 - 46.0 %   MCV 101.4 (H) 80.0 - 100.0 fL   MCH 33.6 26.0 - 34.0  pg   MCHC 33.1 30.0 - 36.0 g/dL   RDW 15.6 (H) 11.5 - 15.5 %   Platelets 281 150 - 400 K/uL   nRBC 0.0 0.0 - 0.2 %  Potassium     Status: Abnormal   Collection Time: 11/22/17  2:45 PM  Result Value Ref Range   Potassium 3.4 (L) 3.5 - 5.1 mmol/L  Magnesium     Status: None   Collection Time: 11/22/17  2:45 PM  Result Value Ref Range   Magnesium 1.8 1.7 - 2.4 mg/dL     Recent Labs    11/21/17 1110 11/21/17 1418 11/22/17 0547  WBC  --  6.6 5.8  HGB 13.6 13.2 12.0  HCT 40.0 38.9 36.2  PLT  --  293 281   BMET Recent Labs    11/21/17 1110 11/21/17 1418 11/22/17 0547 11/22/17 1445  NA 135 139 141  --   K 2.4* 2.7* 2.6* 3.4*  CL  --  91* 99  --   CO2  --  32 33*  --   GLUCOSE 141* 126* 115*  --   BUN  --  9 11  --   CREATININE  --  0.59 0.56  --   CALCIUM  --    9.1 8.4*  --    LFT No results for input(s): PROT, ALBUMIN, AST, ALT, ALKPHOS, BILITOT, BILIDIR, IBILI in the last 72 hours. PT/INR No results for input(s): LABPROT, INR in the last 72 hours. Hepatitis Panel No results for input(s): HEPBSAG, HCVAB, HEPAIGM, HEPBIGM in the last 72 hours. C-Diff No results for input(s): CDIFFTOX in the last 72 hours. No results for input(s): CDIFFPCR in the last 72 hours.   Studies/Results: No results found.  Scheduled Inpatient Medications:   . alprazolam  2 mg Oral TID  . atorvastatin  20 mg Oral Daily  . enoxaparin (LOVENOX) injection  40 mg Subcutaneous Q24H  . estrogens (conjugated)  1.25 mg Oral Daily  . FLUoxetine  40 mg Oral Daily  . metoCLOPramide (REGLAN) injection  10 mg Intravenous Q6H  . ondansetron (ZOFRAN) IV  4 mg Intravenous Q6H  . oxyCODONE  20 mg Oral TID  . pantoprazole  40 mg Oral Daily  . potassium chloride  20 mEq Oral TID  . QUEtiapine  100 mg Oral QHS  . sucralfate  1 g Oral QID    Continuous Inpatient Infusions:   . sodium chloride    . 0.9 % NaCl with KCl 20 mEq / L 100 mL/hr at 11/22/17 1115    PRN Inpatient Medications:   acetaminophen **OR** acetaminophen, albuterol, promethazine, tiotropium    Assessment:  1.  Nausea and vomiting. 2.  Epigastric pain. 3.  Personal history of peptic ulcer disease  Plan:  Plan EGD.The patient understands the nature of the planned procedure, indications, risks, alternatives and potential complications including but not limited to bleeding, infection, perforation, damage to internal organs and possible oversedation/side effects from anesthesia. The patient agrees and gives consent to proceed.  Please refer to procedure notes for findings, recommendations and patient disposition/instructions.  Alfons Sulkowski K. Nobel Brar, M.D. 11/22/2017, 6:51 PM  

## 2017-11-22 NOTE — H&P (View-Only) (Signed)
[facility]  Subjective: Patient seen for f/u nausea, vomiting, abdominal pain.  Patient currently wishes to have upper endoscopy, although she feels  better and has less pain today.  Objective: Vital signs in last 24 hours: Temp:  [98.1 F (36.7 C)-98.4 F (36.9 C)] 98.1 F (36.7 C) (11/21 1355) Pulse Rate:  [90-96] 96 (11/21 1355) Resp:  [17-20] 20 (11/21 1355) BP: (84-121)/(50-85) 121/85 (11/21 1355) SpO2:  [85 %-95 %] 90 % (11/21 1355) Blood pressure 121/85, pulse 96, temperature 98.1 F (36.7 C), temperature source Oral, resp. rate 20, height 5\' 6"  (1.676 m), weight 59.1 kg, SpO2 90 %.    Intake/Output from previous day: 11/20 0701 - 11/21 0700 In: 1268.9 [I.V.:1268.9] Out: 650 [Urine:650]  Intake/Output this shift: Total I/O In: 2050 [I.V.:1400; IV Piggyback:650] Out: -    General appearance: Alert no distress Resp: Clear to auscultation bilaterally Cardio: Regular rate no gallop GI: Soft, benign, mildly tender diffusely.  No masses.  Bowel sounds positive Extremities: No edema   Lab Results: Results for orders placed or performed during the hospital encounter of 11/21/17 (from the past 24 hour(s))  Basic metabolic panel     Status: Abnormal   Collection Time: 11/22/17  5:47 AM  Result Value Ref Range   Sodium 141 135 - 145 mmol/L   Potassium 2.6 (LL) 3.5 - 5.1 mmol/L   Chloride 99 98 - 111 mmol/L   CO2 33 (H) 22 - 32 mmol/L   Glucose, Bld 115 (H) 70 - 99 mg/dL   BUN 11 6 - 20 mg/dL   Creatinine, Ser 1.610.56 0.44 - 1.00 mg/dL   Calcium 8.4 (L) 8.9 - 10.3 mg/dL   GFR calc non Af Amer >60 >60 mL/min   GFR calc Af Amer >60 >60 mL/min   Anion gap 9 5 - 15  CBC     Status: Abnormal   Collection Time: 11/22/17  5:47 AM  Result Value Ref Range   WBC 5.8 4.0 - 10.5 K/uL   RBC 3.57 (L) 3.87 - 5.11 MIL/uL   Hemoglobin 12.0 12.0 - 15.0 g/dL   HCT 09.636.2 04.536.0 - 40.946.0 %   MCV 101.4 (H) 80.0 - 100.0 fL   MCH 33.6 26.0 - 34.0  pg   MCHC 33.1 30.0 - 36.0 g/dL   RDW 81.115.6 (H) 91.411.5 - 78.215.5 %   Platelets 281 150 - 400 K/uL   nRBC 0.0 0.0 - 0.2 %  Potassium     Status: Abnormal   Collection Time: 11/22/17  2:45 PM  Result Value Ref Range   Potassium 3.4 (L) 3.5 - 5.1 mmol/L  Magnesium     Status: None   Collection Time: 11/22/17  2:45 PM  Result Value Ref Range   Magnesium 1.8 1.7 - 2.4 mg/dL     Recent Labs    95/62/1311/20/19 1110 11/21/17 1418 11/22/17 0547  WBC  --  6.6 5.8  HGB 13.6 13.2 12.0  HCT 40.0 38.9 36.2  PLT  --  293 281   BMET Recent Labs    11/21/17 1110 11/21/17 1418 11/22/17 0547 11/22/17 1445  NA 135 139 141  --   K 2.4* 2.7* 2.6* 3.4*  CL  --  91* 99  --   CO2  --  32 33*  --   GLUCOSE 141* 126* 115*  --   BUN  --  9 11  --   CREATININE  --  0.59 0.56  --   CALCIUM  --  9.1 8.4*  --    LFT No results for input(s): PROT, ALBUMIN, AST, ALT, ALKPHOS, BILITOT, BILIDIR, IBILI in the last 72 hours. PT/INR No results for input(s): LABPROT, INR in the last 72 hours. Hepatitis Panel No results for input(s): HEPBSAG, HCVAB, HEPAIGM, HEPBIGM in the last 72 hours. C-Diff No results for input(s): CDIFFTOX in the last 72 hours. No results for input(s): CDIFFPCR in the last 72 hours.   Studies/Results: No results found.  Scheduled Inpatient Medications:   . alprazolam  2 mg Oral TID  . atorvastatin  20 mg Oral Daily  . enoxaparin (LOVENOX) injection  40 mg Subcutaneous Q24H  . estrogens (conjugated)  1.25 mg Oral Daily  . FLUoxetine  40 mg Oral Daily  . metoCLOPramide (REGLAN) injection  10 mg Intravenous Q6H  . ondansetron (ZOFRAN) IV  4 mg Intravenous Q6H  . oxyCODONE  20 mg Oral TID  . pantoprazole  40 mg Oral Daily  . potassium chloride  20 mEq Oral TID  . QUEtiapine  100 mg Oral QHS  . sucralfate  1 g Oral QID    Continuous Inpatient Infusions:   . sodium chloride    . 0.9 % NaCl with KCl 20 mEq / L 100 mL/hr at 11/22/17 1115    PRN Inpatient Medications:   acetaminophen **OR** acetaminophen, albuterol, promethazine, tiotropium    Assessment:  1.  Nausea and vomiting. 2.  Epigastric pain. 3.  Personal history of peptic ulcer disease  Plan:  Plan EGD.The patient understands the nature of the planned procedure, indications, risks, alternatives and potential complications including but not limited to bleeding, infection, perforation, damage to internal organs and possible oversedation/side effects from anesthesia. The patient agrees and gives consent to proceed.  Please refer to procedure notes for findings, recommendations and patient disposition/instructions.  Teodoro K. Norma Fredrickson, M.D. 11/22/2017, 6:51 PM

## 2017-11-23 ENCOUNTER — Inpatient Hospital Stay: Payer: Medicaid Other | Admitting: Anesthesiology

## 2017-11-23 ENCOUNTER — Encounter
Admission: RE | Disposition: A | Discharge status: Home / Self Care | Payer: Self-pay | Source: Ambulatory Visit | Attending: Internal Medicine

## 2017-11-23 ENCOUNTER — Encounter: Payer: Self-pay | Admitting: Anesthesiology

## 2017-11-23 HISTORY — PX: ESOPHAGOGASTRODUODENOSCOPY (EGD) WITH PROPOFOL: SHX5813

## 2017-11-23 LAB — BASIC METABOLIC PANEL
Anion gap: 10 (ref 5–15)
BUN: 10 mg/dL (ref 6–20)
CALCIUM: 7.9 mg/dL — AB (ref 8.9–10.3)
CO2: 23 mmol/L (ref 22–32)
Chloride: 109 mmol/L (ref 98–111)
Creatinine, Ser: 0.43 mg/dL — ABNORMAL LOW (ref 0.44–1.00)
Glucose, Bld: 109 mg/dL — ABNORMAL HIGH (ref 70–99)
POTASSIUM: 3.5 mmol/L (ref 3.5–5.1)
Sodium: 142 mmol/L (ref 135–145)

## 2017-11-23 LAB — CBC
HCT: 32.3 % — ABNORMAL LOW (ref 36.0–46.0)
Hemoglobin: 10.3 g/dL — ABNORMAL LOW (ref 12.0–15.0)
MCH: 33.6 pg (ref 26.0–34.0)
MCHC: 31.9 g/dL (ref 30.0–36.0)
MCV: 105.2 fL — ABNORMAL HIGH (ref 80.0–100.0)
PLATELETS: 227 10*3/uL (ref 150–400)
RBC: 3.07 MIL/uL — AB (ref 3.87–5.11)
RDW: 15.9 % — AB (ref 11.5–15.5)
WBC: 5.1 10*3/uL (ref 4.0–10.5)
nRBC: 0 % (ref 0.0–0.2)

## 2017-11-23 LAB — URINALYSIS, ROUTINE W REFLEX MICROSCOPIC
BILIRUBIN URINE: NEGATIVE
Glucose, UA: NEGATIVE mg/dL
Hgb urine dipstick: NEGATIVE
KETONES UR: 20 mg/dL — AB
Leukocytes, UA: NEGATIVE
Nitrite: NEGATIVE
PROTEIN: NEGATIVE mg/dL
Specific Gravity, Urine: 1.023 (ref 1.005–1.030)
pH: 5 (ref 5.0–8.0)

## 2017-11-23 LAB — MAGNESIUM: MAGNESIUM: 2.1 mg/dL (ref 1.7–2.4)

## 2017-11-23 SURGERY — ESOPHAGOGASTRODUODENOSCOPY (EGD) WITH PROPOFOL
Anesthesia: General

## 2017-11-23 MED ORDER — PROPOFOL 500 MG/50ML IV EMUL
INTRAVENOUS | Status: DC | PRN
  Administered 2017-11-23: 150 ug/kg/min via INTRAVENOUS

## 2017-11-23 MED ORDER — FENTANYL CITRATE (PF) 100 MCG/2ML IJ SOLN
INTRAMUSCULAR | Status: AC
  Filled 2017-11-23: qty 2

## 2017-11-23 MED ORDER — FENTANYL CITRATE (PF) 100 MCG/2ML IJ SOLN
INTRAMUSCULAR | Status: DC | PRN
  Administered 2017-11-23: 100 ug via INTRAVENOUS

## 2017-11-23 MED ORDER — LIDOCAINE HCL (CARDIAC) PF 100 MG/5ML IV SOSY
PREFILLED_SYRINGE | INTRAVENOUS | Status: DC | PRN
  Administered 2017-11-23: 100 mg via INTRAVENOUS

## 2017-11-23 MED ORDER — PROPOFOL 10 MG/ML IV BOLUS
INTRAVENOUS | Status: DC | PRN
  Administered 2017-11-23: 50 mg via INTRAVENOUS
  Administered 2017-11-23: 20 mg via INTRAVENOUS

## 2017-11-23 MED ORDER — PANTOPRAZOLE SODIUM 40 MG PO TBEC: 40.0000 mg | DELAYED_RELEASE_TABLET | Freq: Every day | ORAL | 1 refills | Status: DC

## 2017-11-23 NOTE — Discharge Summary (Signed)
[facility] - [place] [facility]  Debbie Gay, [place]47 y[place] DOB Nov 01, 1970, MRN [id number]. 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 female with a known history of bipolar disorder, chronic pain, COPD, GERD, history of previous MI, panic attacks, asthma who was admitted directly from the gastroenterologist office due to intractable nausea vomiting and hypokalemia.  Patient was admitted for further evaluation.  She was seen in consultation by GI who performed a EGD which showed a duodenal stricture.  Which was dilated.  Patient was recommended to be discharged home post procedure.  She will need outpatient follow-up.  We will place her on PPIs.            Consults  GI  Significant Tests:  See full reports for all details     No results found.     Today   Subjective:   Debbie Gay feeling better wants to go home Objective:   Blood pressure (!) 98/59, pulse 91, temperature 97.9 F (36.6 C), temperature source Tympanic, resp. rate 18, height 5\' 6"  (1.676 m), weight 59.1 kg, SpO2 97 %.  .  Intake/Output Summary (Last 24 hours) at 11/23/2017 1454 Last data filed at 11/23/2017 1233 Gross per 24 hour  Intake 3586.09 ml  Output -  Net 3586.09 ml    Exam VITAL SIGNS: Blood pressure (!) 98/59, pulse 91, temperature 97.9 F (36.6 C), temperature source Tympanic, resp. rate 18, height 5\' 6"  (1.676 m), weight 59.1 kg, SpO2 97 %.  GENERAL:  47 y.o.-year-old patient lying in the bed with no acute distress.   EYES: Pupils equal, round, reactive to light and accommodation. No scleral icterus. Extraocular muscles intact.  HEENT: Head atraumatic, normocephalic. Oropharynx and nasopharynx clear.  NECK:  Supple, no jugular venous distention. No thyroid enlargement, no tenderness.  LUNGS: Normal breath sounds bilaterally, no wheezing, rales,rhonchi or crepitation. No use of accessory muscles of respiration.  CARDIOVASCULAR: S1, S2 normal. No murmurs, rubs, or gallops.  ABDOMEN: Soft, nontender, nondistended. Bowel sounds present. No organomegaly or mass.  EXTREMITIES: No pedal edema, cyanosis, or clubbing.  NEUROLOGIC: Cranial nerves II through XII are intact. Muscle strength 5/5 in all extremities. Sensation intact. Gait not checked.  PSYCHIATRIC: The patient is alert and oriented x 3.  SKIN: No obvious rash, lesion, or ulcer.   Data Review     CBC w Diff:  Lab Results  Component Value Date   WBC 5.1 11/23/2017   HGB 10.3 (L) 11/23/2017   HGB 13.6 08/19/2012   HCT 32.3 (L) 11/23/2017   HCT 39.6 08/19/2012   PLT 227 11/23/2017   PLT 270 08/19/2012   LYMPHOPCT 8 07/18/2015   LYMPHOPCT 11.5 04/03/2011   MONOPCT 6 07/18/2015   MONOPCT 2.0 04/03/2011   EOSPCT 0 07/18/2015   EOSPCT 0.0 04/03/2011   BASOPCT 0 07/18/2015   BASOPCT 0.2 04/03/2011   CMP:  Lab Results  Component Value Date   NA 142 11/23/2017   NA 137 08/19/2012   K 3.5 11/23/2017   K 4.3 08/19/2012  CL 109 11/23/2017   CL 103 08/19/2012   CO2 23 11/23/2017   CO2 30 08/19/2012   BUN 10 11/23/2017   BUN 5 (L) 08/19/2012   CREATININE 0.43 (L) 11/23/2017   CREATININE 0.65 08/19/2012   PROT 6.5 07/18/2015   PROT 6.8 08/19/2012   ALBUMIN 3.8 07/18/2015   ALBUMIN 3.2 (L) 08/19/2012   BILITOT 0.6 07/18/2015   BILITOT 0.8 08/19/2012   ALKPHOS 67 07/18/2015   ALKPHOS 77 08/19/2012   AST 135 (H) 07/18/2015   AST 22 08/19/2012   ALT 83 (H) 07/18/2015   ALT 15 08/19/2012  .  Micro Results No results found for this  or any previous visit (from the past 240 hour(s)).      Code Status Orders  (From admission, onward)         Start     Ordered   11/21/17 1402  Full code  Continuous     11/21/17 1405        Code Status History    This patient has a current code status but no historical code status.          Follow-up Information    [name], MD Follow up in 6 day(s).   Specialty:  Family Medicine Contact information: [address] [place] [address] [phone]        [name], MD Follow up.   Specialty:  Gastroenterology Contact information: [address] [place] [address] [phone]           Discharge Medications   Allergies as of 11/23/2017      Reactions   Levaquin [levofloxacin] Swelling   Prednisone Shortness Of Breath, Swelling   Penicillins    Has patient had a PCN reaction causing immediate rash, facial/tongue/throat swelling, SOB or lightheadedness with hypotension: Unknown Has patient had a PCN reaction causing severe rash involving mucus membranes or skin necrosis: Unknown Has patient had a PCN reaction that required hospitalization: Unknown Has patient had a PCN reaction occurring within the last 10 years: Unknown If all of the above answers are "NO", then may proceed with Cephalosporin use.      Medication List    TAKE these medications   acetaminophen 325 MG tablet Commonly known as:  TYLENOL Take 650 mg by mouth daily as needed for mild pain or moderate pain.   albuterol 108 (90 Base) MCG/ACT inhaler Commonly known as:  PROVENTIL HFA;VENTOLIN HFA Inhale 2 puffs into the lungs every 6 (six) hours as needed for wheezing or shortness of breath.   alprazolam 2 MG tablet Commonly known as:  XANAX Take 2 mg by mouth 3 (three) times daily.   atorvastatin 20 MG tablet Commonly known as:  LIPITOR Take 20 mg by mouth daily.   diphenhydramine-acetaminophen 25-500 MG Tabs tablet Commonly known as:  TYLENOL  PM Take 1 tablet by mouth at bedtime as needed (for sleep).   FLUoxetine 40 MG capsule Commonly known as:  PROZAC Take 40 mg by mouth daily.   KLOR-CON M10 10 MEQ tablet Generic drug:  potassium chloride Take 20 mEq by mouth 2 (two) times daily.   metoCLOPramide 10 MG tablet Commonly known as:  REGLAN Take 10 mg by mouth every 6 (six) hours as needed for nausea.   nitroGLYCERIN 0.4 MG/SPRAY spray Commonly known as:  NITROLINGUAL Place 1 spray under the tongue every 5 (five) minutes as needed for chest pain.   Oxycodone HCl 10 MG Tabs Take 20 mg by mouth  3 (three) times daily.   pantoprazole 40 MG tablet Commonly known as:  PROTONIX Take 1 tablet (40 mg total) by mouth daily.   PREMARIN 1.25 MG tablet Generic drug:  estrogens (conjugated) Take 1.25 mg by mouth daily.   promethazine 25 MG tablet Commonly known as:  PHENERGAN Take 25 mg by mouth every 4 (four) hours as needed for nausea or vomiting.   QUEtiapine 100 MG tablet Commonly known as:  SEROQUEL Take 100-200 mg by mouth at bedtime.   sucralfate 1 g tablet Commonly known as:  CARAFATE Take 1 g by mouth 4 (four) times daily.   tiotropium 18 MCG inhalation capsule Commonly known as:  SPIRIVA Place 18 mcg into inhaler and inhale daily as needed (for wheezing/shortness of breath).          Total Time in preparing paper work, data evaluation and todays exam - 35 minutes  Auburn Bilberry M.D on 11/23/2017 at 2:54 PM [facility]   Office  [phone]

## 2017-11-23 NOTE — Anesthesia Postprocedure Evaluation (Signed)
Anesthesia Post Note  Patient: Charlene BrookeCarrie L Bottoms  Procedure(s) Performed: ESOPHAGOGASTRODUODENOSCOPY (EGD) WITH PROPOFOL (N/A )  Patient location during evaluation: Endoscopy Anesthesia Type: General Level of consciousness: awake and alert Pain management: pain level controlled Vital Signs Assessment: post-procedure vital signs reviewed and stable Respiratory status: spontaneous breathing, nonlabored ventilation, respiratory function stable and patient connected to nasal cannula oxygen Cardiovascular status: blood pressure returned to baseline and stable Postop Assessment: no apparent nausea or vomiting Anesthetic complications: no     Last Vitals:  Vitals:   11/23/17 0457 11/23/17 1238  BP: 97/60 (!) 98/59  Pulse: 90 91  Resp: (!) 22 18  Temp: 36.7 C 36.6 C  SpO2: (!) 87% 97%    Last Pain:  Vitals:   11/23/17 1301  TempSrc:   PainSc: 0-No pain                 Rylynne Schicker S

## 2017-11-23 NOTE — Consult Note (Signed)
PHARMACY CONSULT NOTE - FOLLOW UP  Pharmacy Consult for Electrolyte Monitoring and Replacement   Recent Labs: Potassium (mmol/L)  Date Value  11/23/2017 3.5  08/19/2012 4.3   Magnesium (mg/dL)  Date Value  62/95/284111/22/2019 2.1  04/02/2011 1.8   Calcium (mg/dL)  Date Value  32/44/010211/22/2019 7.9 (L)   Calcium, Total (mg/dL)  Date Value  72/53/664408/18/2014 8.7   Albumin (g/dL)  Date Value  03/47/425907/16/2017 3.8  08/19/2012 3.2 (L)     Assessment: K- 3.5 Mg - 2.1  Goal of Therapy:  Potassium ~4, Mg ~ 2  Plan:  Patient is ordered KCl PO 20 mEq TID today and therefore with level of 3.5 will hold on additional replacement at this time.  Will recheck potassium with morning labs    Albina Billetharles M Cianni Manny ,PharmD Clinical Pharmacist 11/23/2017 7:26 AM

## 2017-11-23 NOTE — Transfer of Care (Signed)
Immediate Anesthesia Transfer of Care Note  Patient: Debbie Gay  Procedure(s) Performed: ESOPHAGOGASTRODUODENOSCOPY (EGD) WITH PROPOFOL (N/A )  Patient Location: PACU  Anesthesia Type:General  Level of Consciousness: drowsy  Airway & Oxygen Therapy: Patient Spontanous Breathing and Patient connected to nasal cannula oxygen  Post-op Assessment: Report given to RN and Post -op Vital signs reviewed and stable  Post vital signs: Reviewed and stable  Last Vitals:  Vitals Value Taken Time  BP 98/59 11/23/2017 12:39 PM  Temp    Pulse 90 11/23/2017 12:39 PM  Resp 15 11/23/2017 12:39 PM  SpO2 97 % 11/23/2017 12:39 PM  Vitals shown include unvalidated device data.  Last Pain:  Vitals:   11/23/17 1238  TempSrc: (P) Tympanic  PainSc: (P) 0-No pain         Complications: No apparent anesthesia complications

## 2017-11-23 NOTE — Anesthesia Post-op Follow-up Note (Signed)
Anesthesia QCDR form completed.        

## 2017-11-23 NOTE — Anesthesia Procedure Notes (Signed)
Performed by: Tovia Kisner, CRNA Pre-anesthesia Checklist: Patient identified, Emergency Drugs available, Suction available, Patient being monitored and Timeout performed Patient Re-evaluated:Patient Re-evaluated prior to induction Oxygen Delivery Method: Nasal cannula Induction Type: IV induction       

## 2017-11-23 NOTE — Op Note (Signed)
[facility] Gastroenterology Patient Name: Debbie Gay Procedure Date: 11/23/2017 12:17 PM MRN: [id number] Account #: [id number] Date of Birth: [birth year] Admit Type: Inpatient Age: 47 Room: [facility] ENDO ROOM 1 Gender: Female Note Status: Finalized Procedure:            Upper GI endoscopy Indications:          Epigastric abdominal pain, Nausea with vomiting,                        Personal history of peptic ulcer disease Providers:            Boykin Nearing. Norma Fredrickson MD, MD Referring MD:         Delora Fuel (Referring MD) Medicines:            Propofol per Anesthesia Complications:        No immediate complications. Procedure:            Pre-Anesthesia Assessment:                       - The risks and benefits of the procedure and the                        sedation options and risks were discussed with the                        patient. All questions were answered and informed                        consent was obtained.                       - Patient identification and proposed procedure were                        verified prior to the procedure by the nurse. The                        procedure was verified in the procedure room.                       - ASA Grade Assessment: III - A patient with severe                        systemic disease.                       - After reviewing the risks and benefits, the patient                        was deemed in satisfactory condition to undergo the                        procedure.                       After obtaining informed consent, the endoscope was                        passed under direct vision. Throughout the procedure,  the patient's blood pressure, pulse, and oxygen                        saturations were monitored continuously. The Endoscope                        was introduced through the mouth, and advanced to the                        third part of duodenum. The upper GI  endoscopy was                        technically difficult and complex due to stricture.                        Successful completion of the procedure was aided by                        balloon dilation of duodenal stricture. The patient                        tolerated the procedure well. Findings:      The examined esophagus was normal.      Scattered moderate inflammation characterized by congestion (edema) and       erosions was found in the gastric body and in the gastric antrum.       Biopsies were taken with a cold forceps for Helicobacter pylori testing.      An acquired benign-appearing, intrinsic severe stenosis was found in the       second portion of the duodenum and was traversed after dilation. A TTS       dilator was passed through the scope. Dilation with a 10-13-10 mm       balloon dilator was performed to 12 mm. The dilation site was examined       following endoscope reinsertion and showed complete resolution of       luminal narrowing.      The exam was otherwise without abnormality. Impression:           - Normal esophagus.                       - Chronic gastritis. Biopsied.                       - Acquired duodenal stenosis. Dilated.                       - The examination was otherwise normal. Recommendation:       - Await pathology results.                       - No aspirin, ibuprofen, naproxen, or other                        non-steroidal anti-inflammatory drugs.                       - Return patient to hospital ward for possible                        discharge same day.                       -  Resume previous diet and advance diet as tolerated.                       - Use Protonix (pantoprazole) 40 mg PO daily. Procedure Code(s):    --- Professional ---                       [id number], Esophagogastroduodenoscopy, flexible, transoral;                        with dilation of gastric/duodenal stricture(s) (eg,                        balloon, bougie)                        43239, 59, Esophagogastroduodenoscopy, flexible,                        transoral; with biopsy, single or multiple Diagnosis Code(s):    --- Professional ---                       Z87.11, Personal history of peptic ulcer disease                       R11.2, Nausea with vomiting, unspecified                       R10.13, Epigastric pain                       K31.5, Obstruction of duodenum                       K29.50, Unspecified chronic gastritis without bleeding CPT copyright 2018 American Medical Association. All rights reserved. The codes documented in this report are preliminary and upon coder review may  be revised to meet current compliance requirements. [name] MD, MD 11/23/2017 12:42:19 PM This report has been signed electronically. Number of Addenda: 0 Note Initiated On: 11/23/2017 12:17 PM      [facility]

## 2017-11-23 NOTE — Anesthesia Preprocedure Evaluation (Signed)
Anesthesia Evaluation  Patient identified by MRN, date of birth, ID band Patient awake    Reviewed: Allergy & Precautions, NPO status , Patient's Chart, lab work & pertinent test results  History of Anesthesia Complications Negative for: history of anesthetic complications  Airway Mallampati: II  TM Distance: >3 FB Neck ROM: Full    Dental  (+) Poor Dentition, Missing   Pulmonary asthma , neg sleep apnea, COPD, Current Smoker,    breath sounds clear to auscultation- rhonchi (-) wheezing      Cardiovascular Exercise Tolerance: Good + Past MI  (-) CAD, (-) Cardiac Stents and (-) CABG  Rhythm:Regular Rate:Normal - Systolic murmurs and - Diastolic murmurs    Neuro/Psych PSYCHIATRIC DISORDERS Anxiety Bipolar Disorder negative neurological ROS     GI/Hepatic Neg liver ROS, GERD  ,  Endo/Other  negative endocrine ROSneg diabetes  Renal/GU negative Renal ROS     Musculoskeletal negative musculoskeletal ROS (+)   Abdominal (+) - obese,   Peds  Hematology negative hematology ROS (+)   Anesthesia Other Findings Past Medical History: No date: Asthma No date: Bipolar 1 disorder (HCC) No date: Chronic pain No date: COPD (chronic obstructive pulmonary disease) (HCC) No date: GERD (gastroesophageal reflux disease) No date: History of heart attack No date: Myocardial infarction (HCC) No date: Panic attacks   Reproductive/Obstetrics                             Anesthesia Physical Anesthesia Plan  ASA: II  Anesthesia Plan: General   Post-op Pain Management:    Induction: Intravenous  PONV Risk Score and Plan: 1 and Propofol infusion  Airway Management Planned: Natural Airway  Additional Equipment:   Intra-op Plan:   Post-operative Plan:   Informed Consent: I have reviewed the patients History and Physical, chart, labs and discussed the procedure including the risks, benefits and  alternatives for the proposed anesthesia with the patient or authorized representative who has indicated his/her understanding and acceptance.   Dental advisory given  Plan Discussed with: CRNA and Anesthesiologist  Anesthesia Plan Comments:         Anesthesia Quick Evaluation

## 2017-11-23 NOTE — Interval H&P Note (Signed)
History and Physical Interval Note:  11/23/2017 12:12 PM  Debbie BrookeCarrie L Gay  has presented today for surgery, with the diagnosis of N & V, EPIGASTRIC PAIN  The various methods of treatment have been discussed with the patient and family. After consideration of risks, benefits and other options for treatment, the patient has consented to  Procedure(s): ESOPHAGOGASTRODUODENOSCOPY (EGD) WITH PROPOFOL (N/A) as a surgical intervention .  The patient's history has been reviewed, patient examined, no change in status, stable for surgery.  I have reviewed the patient's chart and labs.  Questions were answered to the patient's satisfaction.     [place], [place]

## 2017-11-23 NOTE — Interval H&P Note (Signed)
History and Physical Interval Note:  11/23/2017 12:14 PM  Debbie Gay  has presented today for surgery, with the diagnosis of N & V, EPIGASTRIC PAIN  The various methods of treatment have been discussed with the patient and family. After consideration of risks, benefits and other options for treatment, the patient has consented to  Procedure(s): ESOPHAGOGASTRODUODENOSCOPY (EGD) WITH PROPOFOL (N/A) as a surgical intervention .  The patient's history has been reviewed, patient examined, no change in status, stable for surgery.  I have reviewed the patient's chart and labs.  Questions were answered to the patient's satisfaction.     [place], [place]

## 2017-11-26 ENCOUNTER — Encounter: Payer: Self-pay | Admitting: Internal Medicine

## 2017-11-26 LAB — SURGICAL PATHOLOGY

## 2017-11-27 ENCOUNTER — Telehealth: Payer: Self-pay

## 2017-11-27 NOTE — Telephone Encounter (Signed)
EMMI Follow-up: Noted on the report that the patient didn't know who to contact if there was changes in her condition and didn't have a follow-up appointment.  I called Ms. Laurell JosephsBurke but the mailbox was full so unable to leave a message.

## 2017-11-28 ENCOUNTER — Other Ambulatory Visit: Payer: Self-pay

## 2017-11-28 ENCOUNTER — Telehealth: Payer: Self-pay

## 2017-11-28 ENCOUNTER — Emergency Department
Admission: EM | Admit: 2017-11-28 | Discharge: 2017-11-28 | Disposition: A | Discharge status: Other Acute Inpatient Hospital | Payer: Medicaid Other | Attending: Emergency Medicine | Admitting: Emergency Medicine

## 2017-11-28 ENCOUNTER — Emergency Department: Payer: Medicaid Other

## 2017-11-28 ENCOUNTER — Inpatient Hospital Stay (HOSPITAL_COMMUNITY)
Admission: AD | Admit: 2017-11-28 | Discharge: 2017-12-02 | DRG: 071 | Disposition: A | Discharge status: Home Health | Payer: Medicaid Other | Source: Other Acute Inpatient Hospital | Attending: Internal Medicine | Admitting: Internal Medicine

## 2017-11-28 DIAGNOSIS — K2100 Gastro-esophageal reflux disease with esophagitis, without bleeding: Secondary | ICD-10-CM | POA: Diagnosis present

## 2017-11-28 DIAGNOSIS — J4520 Mild intermittent asthma, uncomplicated: Secondary | ICD-10-CM

## 2017-11-28 DIAGNOSIS — Z8349 Family history of other endocrine, nutritional and metabolic diseases: Secondary | ICD-10-CM

## 2017-11-28 DIAGNOSIS — Z83511 Family history of glaucoma: Secondary | ICD-10-CM | POA: Diagnosis not present

## 2017-11-28 DIAGNOSIS — Z90710 Acquired absence of both cervix and uterus: Secondary | ICD-10-CM

## 2017-11-28 DIAGNOSIS — Z79891 Long term (current) use of opiate analgesic: Secondary | ICD-10-CM

## 2017-11-28 DIAGNOSIS — J45909 Unspecified asthma, uncomplicated: Secondary | ICD-10-CM | POA: Diagnosis present

## 2017-11-28 DIAGNOSIS — R270 Ataxia, unspecified: Secondary | ICD-10-CM | POA: Diagnosis not present

## 2017-11-28 DIAGNOSIS — E785 Hyperlipidemia, unspecified: Secondary | ICD-10-CM | POA: Diagnosis not present

## 2017-11-28 DIAGNOSIS — R4182 Altered mental status, unspecified: Secondary | ICD-10-CM | POA: Insufficient documentation

## 2017-11-28 DIAGNOSIS — R569 Unspecified convulsions: Secondary | ICD-10-CM | POA: Diagnosis not present

## 2017-11-28 DIAGNOSIS — F410 Panic disorder [episodic paroxysmal anxiety] without agoraphobia: Secondary | ICD-10-CM | POA: Diagnosis present

## 2017-11-28 DIAGNOSIS — K315 Obstruction of duodenum: Secondary | ICD-10-CM | POA: Diagnosis not present

## 2017-11-28 DIAGNOSIS — Z79899 Other long term (current) drug therapy: Secondary | ICD-10-CM

## 2017-11-28 DIAGNOSIS — G8929 Other chronic pain: Secondary | ICD-10-CM | POA: Diagnosis present

## 2017-11-28 DIAGNOSIS — I252 Old myocardial infarction: Secondary | ICD-10-CM

## 2017-11-28 DIAGNOSIS — G629 Polyneuropathy, unspecified: Secondary | ICD-10-CM | POA: Diagnosis present

## 2017-11-28 DIAGNOSIS — E512 Wernicke's encephalopathy: Secondary | ICD-10-CM | POA: Diagnosis not present

## 2017-11-28 DIAGNOSIS — F17200 Nicotine dependence, unspecified, uncomplicated: Secondary | ICD-10-CM | POA: Insufficient documentation

## 2017-11-28 DIAGNOSIS — E876 Hypokalemia: Secondary | ICD-10-CM | POA: Diagnosis present

## 2017-11-28 DIAGNOSIS — Z825 Family history of asthma and other chronic lower respiratory diseases: Secondary | ICD-10-CM

## 2017-11-28 DIAGNOSIS — F17210 Nicotine dependence, cigarettes, uncomplicated: Secondary | ICD-10-CM | POA: Diagnosis not present

## 2017-11-28 DIAGNOSIS — F32A Depression, unspecified: Secondary | ICD-10-CM | POA: Diagnosis present

## 2017-11-28 DIAGNOSIS — E782 Mixed hyperlipidemia: Secondary | ICD-10-CM | POA: Diagnosis present

## 2017-11-28 DIAGNOSIS — F319 Bipolar disorder, unspecified: Secondary | ICD-10-CM | POA: Diagnosis not present

## 2017-11-28 DIAGNOSIS — R339 Retention of urine, unspecified: Secondary | ICD-10-CM | POA: Diagnosis present

## 2017-11-28 DIAGNOSIS — J449 Chronic obstructive pulmonary disease, unspecified: Secondary | ICD-10-CM | POA: Diagnosis present

## 2017-11-28 DIAGNOSIS — I2510 Atherosclerotic heart disease of native coronary artery without angina pectoris: Secondary | ICD-10-CM | POA: Diagnosis present

## 2017-11-28 DIAGNOSIS — R531 Weakness: Secondary | ICD-10-CM | POA: Diagnosis present

## 2017-11-28 DIAGNOSIS — G9341 Metabolic encephalopathy: Secondary | ICD-10-CM | POA: Diagnosis present

## 2017-11-28 DIAGNOSIS — K219 Gastro-esophageal reflux disease without esophagitis: Secondary | ICD-10-CM | POA: Diagnosis not present

## 2017-11-28 DIAGNOSIS — F419 Anxiety disorder, unspecified: Secondary | ICD-10-CM | POA: Diagnosis present

## 2017-11-28 DIAGNOSIS — I10 Essential (primary) hypertension: Secondary | ICD-10-CM | POA: Diagnosis present

## 2017-11-28 DIAGNOSIS — R0902 Hypoxemia: Secondary | ICD-10-CM | POA: Diagnosis present

## 2017-11-28 DIAGNOSIS — H499 Unspecified paralytic strabismus: Secondary | ICD-10-CM | POA: Diagnosis present

## 2017-11-28 DIAGNOSIS — B9681 Helicobacter pylori [H. pylori] as the cause of diseases classified elsewhere: Secondary | ICD-10-CM | POA: Diagnosis present

## 2017-11-28 DIAGNOSIS — F329 Major depressive disorder, single episode, unspecified: Secondary | ICD-10-CM | POA: Diagnosis present

## 2017-11-28 DIAGNOSIS — Z720 Tobacco use: Secondary | ICD-10-CM

## 2017-11-28 HISTORY — DX: Depression, unspecified: F32.A

## 2017-11-28 HISTORY — DX: Major depressive disorder, single episode, unspecified: F32.9

## 2017-11-28 LAB — URINALYSIS, COMPLETE (UACMP) WITH MICROSCOPIC
BACTERIA UA: NONE SEEN
Glucose, UA: NEGATIVE mg/dL
Hgb urine dipstick: NEGATIVE
Ketones, ur: 20 mg/dL — AB
Leukocytes, UA: NEGATIVE
NITRITE: NEGATIVE
PH: 5 (ref 5.0–8.0)
Protein, ur: 30 mg/dL — AB
SPECIFIC GRAVITY, URINE: 1.018 (ref 1.005–1.030)

## 2017-11-28 LAB — CBC
HCT: 38.6 % (ref 36.0–46.0)
HEMOGLOBIN: 13.2 g/dL (ref 12.0–15.0)
MCH: 34.3 pg — ABNORMAL HIGH (ref 26.0–34.0)
MCHC: 34.2 g/dL (ref 30.0–36.0)
MCV: 100.3 fL — AB (ref 80.0–100.0)
PLATELETS: 298 10*3/uL (ref 150–400)
RBC: 3.85 MIL/uL — ABNORMAL LOW (ref 3.87–5.11)
RDW: 16.3 % — ABNORMAL HIGH (ref 11.5–15.5)
WBC: 8.2 10*3/uL (ref 4.0–10.5)
nRBC: 0 % (ref 0.0–0.2)

## 2017-11-28 LAB — ETHANOL

## 2017-11-28 LAB — COMPREHENSIVE METABOLIC PANEL
ALT: 19 U/L (ref 0–44)
AST: 30 U/L (ref 15–41)
Albumin: 3.2 g/dL — ABNORMAL LOW (ref 3.5–5.0)
Alkaline Phosphatase: 60 U/L (ref 38–126)
Anion gap: 17 — ABNORMAL HIGH (ref 5–15)
BUN: 18 mg/dL (ref 6–20)
CO2: 25 mmol/L (ref 22–32)
Calcium: 8.8 mg/dL — ABNORMAL LOW (ref 8.9–10.3)
Chloride: 98 mmol/L (ref 98–111)
Creatinine, Ser: 0.69 mg/dL (ref 0.44–1.00)
GFR calc Af Amer: >60 mL/min (ref >60)
GFR calc non Af Amer: >60 mL/min (ref >60)
GLUCOSE: 119 mg/dL — AB (ref 70–99)
POTASSIUM: 2.6 mmol/L — AB (ref 3.5–5.1)
Sodium: 140 mmol/L (ref 135–145)
Total Bilirubin: 2.2 mg/dL — ABNORMAL HIGH (ref 0.3–1.2)
Total Protein: 6.6 g/dL (ref 6.5–8.1)

## 2017-11-28 LAB — URINE DRUG SCREEN, QUALITATIVE (ARMC ONLY)
AMPHETAMINES, UR SCREEN: NOT DETECTED
Barbiturates, Ur Screen: NOT DETECTED
Benzodiazepine, Ur Scrn: POSITIVE — AB
COCAINE METABOLITE, UR ~~LOC~~: NOT DETECTED
Cannabinoid 50 Ng, Ur ~~LOC~~: NOT DETECTED
MDMA (ECSTASY) UR SCREEN: NOT DETECTED
METHADONE SCREEN, URINE: NOT DETECTED
OPIATE, UR SCREEN: NOT DETECTED
Phencyclidine (PCP) Ur S: NOT DETECTED
Tricyclic, Ur Screen: POSITIVE — AB

## 2017-11-28 LAB — GLUCOSE, CAPILLARY: Glucose-Capillary: 104 mg/dL — ABNORMAL HIGH (ref 70–99)

## 2017-11-28 LAB — ACETAMINOPHEN LEVEL

## 2017-11-28 LAB — MAGNESIUM: MAGNESIUM: 2 mg/dL (ref 1.7–2.4)

## 2017-11-28 LAB — CK: CK TOTAL: 249 U/L — AB (ref 38–234)

## 2017-11-28 LAB — AMMONIA: AMMONIA: 12 umol/L (ref 9–35)

## 2017-11-28 LAB — SALICYLATE LEVEL: Salicylate Lvl: <7 mg/dL (ref 2.8–30.0)

## 2017-11-28 LAB — TROPONIN I

## 2017-11-28 MED ORDER — LEVETIRACETAM IN NACL 1500 MG/100ML IV SOLN
1500.0000 mg | Freq: Once | INTRAVENOUS | Status: AC
  Administered 2017-11-28: 1500 mg via INTRAVENOUS
  Filled 2017-11-28: qty 100

## 2017-11-28 MED ORDER — POTASSIUM CHLORIDE 10 MEQ/100ML IV SOLN
10.0000 meq | Freq: Once | INTRAVENOUS | Status: AC
  Administered 2017-11-28: 10 meq via INTRAVENOUS
  Filled 2017-11-28: qty 100

## 2017-11-28 MED ORDER — LEVETIRACETAM IN NACL 1000 MG/100ML IV SOLN: 1000.0000 mg | Freq: Once | INTRAVENOUS | Status: DC

## 2017-11-28 MED ORDER — LORAZEPAM 2 MG/ML IJ SOLN
1.0000 mg | Freq: Once | INTRAMUSCULAR | Status: AC
  Administered 2017-11-28: 1 mg via INTRAVENOUS
  Filled 2017-11-28: qty 1

## 2017-11-28 MED ORDER — SODIUM CHLORIDE 0.9 % IV BOLUS
1000.0000 mL | Freq: Once | INTRAVENOUS | Status: AC
  Administered 2017-11-28: 1000 mL via INTRAVENOUS

## 2017-11-28 NOTE — ED Notes (Signed)
Pt states that she is okay with her son signing the transfer consent.

## 2017-11-28 NOTE — ED Notes (Signed)
Patient denies pain and is resting comfortably.  

## 2017-11-28 NOTE — H&P (Signed)
History and Physical    Debbie Gay GNF:[id number] DOB: 1970-08-09 DOA: 11/28/2017  Referring MD/NP/PA:   PCP: [name], MD   Patient coming from:  The patient is coming from home.  At baseline, pt is independent for most of ADL.        Chief Complaint: Altered mental status  HPI: Debbie Gay is a 46 y.o. 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 Patient is transfer here from [facility].  Patient has AMS, and is unable to provide accurate medical history, therefore, most of the history is obtained by report and accepting physicians note. Per report, pt was recently admitted at [facility] for nausea and vomiting. She had duodenal structure which was dilated on 11/22. She is chronic pain medications.  Oxycodone 20 mg 3 times daily is on her medication list.  Pt was found to be confused by family and brought to the hospital. While in the ER patient had staring spells and muscle twitching on face, which is concerning for complex partial seizures. Neurologist at [facility] advised Keppra loading. 1500 mg of Keppra was loaded. It was believed that pt will need EEG, but there is no EEG available in [facility], therefore pt is transferred to [facility] hospital. When I saw pt on the floor, she is confused. She is not oriented x 3.  No active nausea vomiting, diarrhea notated.  No active cough, shortness breath, respiratory distress noted.  Patient moves all extremities normally.  No facial droop or slurred speech. No jerking, seizure or staring spells currently.  Of note, pt is also taking several sedative medications, including Xanax, diphenhydramine-Tylenol, Prozac, Reglan, Seroquel and Phenergan at home. Per RN, pt did not pass swallowing screen.  ED Course: pt was found to have WBC 8.2,  negative troponin, positive UDS for benzos and tricyclics, ammonia 12, CK 249, Tylenol level less than 10, salicylate level less than 7, negative urinalysis, potassium 2.6 (10 mEq x 2 of KCL was given in ED), creatinine normal, temperature normal, slightly tachycardia, oxygen satting 97% on room air.  Chest x-ray is negative.  CT head is negative for acute intracranial abnormalities.  Patient is placed on telemetry bed of observation.  Review of Systems: Could not be reviewed due to altered mental status.  Allergy:  Allergies  Allergen Reactions  . Levaquin [Levofloxacin] Swelling  . Prednisone Shortness Of Breath and Swelling  . Penicillins     Has patient had a PCN reaction causing immediate rash, facial/tongue/throat swelling, SOB or lightheadedness with hypotension: Unknown Has patient had a PCN reaction causing severe rash involving mucus membranes or skin necrosis: Unknown Has patient had a PCN reaction that required hospitalization: Unknown Has patient had a PCN reaction occurring within the last 10 years: Unknown If all of the above answers are "NO", then may proceed with Cephalosporin use.    Past Medical History:  Diagnosis Date  . Asthma   . Bipolar 1 disorder (HCC)   . Chronic pain   . COPD (chronic obstructive pulmonary disease) (HCC)   . Depression   . GERD (gastroesophageal reflux disease)   . History of heart attack   . Myocardial infarction (HCC)   . Panic attacks     Past Surgical History:  Procedure Laterality Date  . ABDOMINAL HYSTERECTOMY    . COLONOSCOPY WITH PROPOFOL N/A 10/30/2014   Procedure: COLONOSCOPY  WITH PROPOFOL;  Surgeon: Christena Deem, MD;  Location: [facility] ENDOSCOPY;  Service: Endoscopy;  Laterality: N/A;  . ESOPHAGOGASTRODUODENOSCOPY (EGD) WITH PROPOFOL N/A 10/30/2014   Procedure: ESOPHAGOGASTRODUODENOSCOPY (EGD) WITH PROPOFOL;  Surgeon: Christena Deem, MD;  Location: [facility] ENDOSCOPY;  Service: Endoscopy;  Laterality: N/A;  .  ESOPHAGOGASTRODUODENOSCOPY (EGD) WITH PROPOFOL N/A 11/23/2017   Procedure: ESOPHAGOGASTRODUODENOSCOPY (EGD) WITH PROPOFOL;  Surgeon: Toledo, Boykin Nearing, MD;  Location: [facility] ENDOSCOPY;  Service: Gastroenterology;  Laterality: N/A;    Social History:  reports that she has been smoking. She has a 10.00 pack-year smoking history. She has never used smokeless tobacco. She reports that she does not drink alcohol or use drugs.  Family History:  Family History  Problem Relation Age of Onset  . COPD Mother   . Irritable bowel syndrome Mother   . Glaucoma Father   . Hyperlipidemia Father      Prior to Admission medications   Medication Sig Start Date End Date Taking? Authorizing Provider  acetaminophen (TYLENOL) 325 MG tablet Take 650 mg by mouth daily as needed for mild pain or moderate pain.     [provider]  albuterol (PROVENTIL HFA;VENTOLIN HFA) 108 (90 Base) MCG/ACT inhaler Inhale 2 puffs into the lungs every 6 (six) hours as needed for wheezing or shortness of breath.     [provider]  alprazolam Prudy Feeler) 2 MG tablet Take 2 mg by mouth 3 (three) times daily.    [provider]  atorvastatin (LIPITOR) 20 MG tablet Take 20 mg by mouth daily.    [provider]  diphenhydramine-acetaminophen (TYLENOL PM) 25-500 MG TABS tablet Take 1 tablet by mouth at bedtime as needed (for sleep).     [provider]  FLUoxetine (PROZAC) 40 MG capsule Take 40 mg by mouth daily.    [provider]  KLOR-CON M10 10 MEQ tablet Take 20 mEq by mouth 2 (two) times daily.    [provider]  metoCLOPramide (REGLAN) 10 MG tablet Take 10 mg by mouth every 6 (six) hours as needed for nausea.     [provider]  nitroGLYCERIN (NITROLINGUAL) 0.4 MG/SPRAY spray Place 1 spray under the tongue every 5 (five) minutes as needed for chest pain.     [provider]  Oxycodone HCl 10 MG TABS Take 20 mg by mouth 3 (three) times daily.    [provider]  pantoprazole (PROTONIX) 40 MG tablet Take 1 tablet (40 mg total) by mouth daily. 11/23/17 01/22/18  Auburn Bilberry, MD  PREMARIN 1.25 MG tablet Take 1.25 mg by mouth daily.    [provider]  promethazine (PHENERGAN) 25 MG tablet Take 25 mg by mouth every 4 (four) hours as needed for nausea or vomiting.     [provider]  QUEtiapine (SEROQUEL) 100 MG tablet Take 100-200 mg by mouth at bedtime.    [provider]  sucralfate (CARAFATE) 1 g tablet Take 1 g by mouth 4 (four) times daily.    [provider]  tiotropium (SPIRIVA) 18 MCG inhalation capsule Place 18 mcg into inhaler and inhale daily as needed (for wheezing/shortness of breath).     [provider]    Physical Exam: Vitals:   11/28/17 2302 11/29/17 0336 11/29/17 0519  BP:   97/69  Pulse:   (!) 101  Resp:   20  Temp:   98.4 F (36.9 C)  TempSrc:   Oral  SpO2:  96% 97%  Weight: 65.2 kg  General: Not in acute distress HEENT:       Eyes: PERRL, EOMI, no scleral icterus.       ENT: No discharge from the ears and nose, no pharynx injection, no tonsillar enlargement.        Neck: No JVD, no bruit, no mass felt. Heme: No neck lymph node enlargement. Cardiac: S1/S2, RRR, No murmurs, No gallops or rubs. Respiratory: No rales, wheezing, rhonchi or rubs. GI: Soft, nondistended, nontender, no organomegaly, BS present. GU: No hematuria Ext: has trace leg edema bilaterally. 2+DP/PT pulse bilaterally. Musculoskeletal: No joint deformities, No joint redness or warmth, no limitation of ROM in spin. Skin: No rashes.  Neuro: confused, not oriented X3, cranial nerves II-XII grossly intact, moves all extremities normally. Psych: Patient is not psychotic, no suicidal or hemocidal ideation.  Labs on Admission: I have personally reviewed following labs and imaging studies  CBC: Recent Labs  Lab 11/23/17 0339 11/28/17 1441  WBC 5.1 8.2  HGB 10.3* 13.2  HCT 32.3* 38.6    MCV 105.2* 100.3*  PLT 227 298   Basic Metabolic Panel: Recent Labs  Lab 11/22/17 1445 11/23/17 0339 11/28/17 1441  NA  --  142 140  K 3.4* 3.5 2.6*  CL  --  109 98  CO2  --  23 25  GLUCOSE  --  109* 119*  BUN  --  10 18  CREATININE  --  0.43* 0.69  CALCIUM  --  7.9* 8.8*  MG 1.8 2.1 2.0   GFR: Estimated Creatinine Clearance: 83.1 mL/min (by C-G formula based on SCr of 0.69 mg/dL). Liver Function Tests: Recent Labs  Lab 11/28/17 1441  AST 30  ALT 19  ALKPHOS 60  BILITOT 2.2*  PROT 6.6  ALBUMIN 3.2*   No results for input(s): LIPASE, AMYLASE in the last 168 hours. Recent Labs  Lab 11/28/17 1703  AMMONIA 12   Coagulation Profile: No results for input(s): INR, PROTIME in the last 168 hours. Cardiac Enzymes: Recent Labs  Lab 11/28/17 1441  CKTOTAL 249*  TROPONINI <0.03   BNP (last 3 results) No results for input(s): PROBNP in the last 8760 hours. HbA1C: No results for input(s): HGBA1C in the last 72 hours. CBG: Recent Labs  Lab 11/28/17 1441  GLUCAP 104*   Lipid Profile: No results for input(s): CHOL, HDL, LDLCALC, TRIG, CHOLHDL, LDLDIRECT in the last 72 hours. Thyroid Function Tests: No results for input(s): TSH, T4TOTAL, FREET4, T3FREE, THYROIDAB in the last 72 hours. Anemia Panel: No results for input(s): VITAMINB12, FOLATE, FERRITIN, TIBC, IRON, RETICCTPCT in the last 72 hours. Urine analysis:    Component Value Date/Time   COLORURINE AMBER (A) 11/28/2017 1703   APPEARANCEUR CLEAR (A) 11/28/2017 1703   APPEARANCEUR Hazy 08/19/2012 1509   LABSPEC 1.018 11/28/2017 1703   LABSPEC 1.010 08/19/2012 1509   PHURINE 5.0 11/28/2017 1703   GLUCOSEU NEGATIVE 11/28/2017 1703   GLUCOSEU Negative 08/19/2012 1509   HGBUR NEGATIVE 11/28/2017 1703   BILIRUBINUR SMALL (A) 11/28/2017 1703   BILIRUBINUR Negative 08/19/2012 1509   KETONESUR 20 (A) 11/28/2017 1703   PROTEINUR 30 (A) 11/28/2017 1703   NITRITE NEGATIVE 11/28/2017 1703   LEUKOCYTESUR NEGATIVE  11/28/2017 1703   LEUKOCYTESUR Negative 08/19/2012 1509   Sepsis Labs: @LABRCNTIP (procalcitonin:4,lacticidven:4) )No results found for this or any previous visit (from the past 240 hour(s)).   Radiological Exams on Admission: Ct Head Wo Contrast  Result Date: 11/28/2017 CLINICAL DATA:  Altered mental status EXAM: CT HEAD WITHOUT CONTRAST TECHNIQUE: Contiguous axial images  were obtained from the base of the skull through the vertex without intravenous contrast. COMPARISON:  07/18/2015 FINDINGS: Brain: No evidence of acute infarction, hemorrhage, hydrocephalus, extra-axial collection or mass lesion/mass effect. Vascular: No hyperdense vessel or unexpected calcification. Skull: Normal. Negative for fracture or focal lesion. Sinuses/Orbits: No acute finding. Other: None. IMPRESSION: Normal head CT Electronically Signed   By: Alcide Clever M.D.   On: 11/28/2017 15:07   Dg Chest Port 1 View  Result Date: 11/28/2017 CLINICAL DATA:  Altered mental status. EXAM: PORTABLE CHEST 1 VIEW COMPARISON:  10/10/2017 FINDINGS: Heart size and pulmonary vascularity are normal. Minimal atelectasis at the lung bases. No effusions or infiltrates. No acute bone abnormality. Previous open reduction and internal fixation of multiple left rib fractures. IMPRESSION: Minimal atelectasis at the lung bases. Electronically Signed   By: Francene Boyers M.D.   On: 11/28/2017 15:35     EKG: Independently reviewed.QTC sinus rhythm, QTC 539, low voltage, nonspecific T wave change.   Assessment/Plan Principal Problem:   Acute metabolic encephalopathy Active Problems:   Hypokalemia   COPD (chronic obstructive pulmonary disease) (HCC)   Asthma   GERD (gastroesophageal reflux disease)   Panic attacks   HLD (hyperlipidemia)   Tobacco abuse   CAD (coronary artery disease)   Bipolar 1 disorder (HCC)   Depression   Acute metabolic encephalopathy: Etiology is not clear.  Differential diagnosis include polypharmacy, complex  seizure or other intracranial abnormalities.  No signs of infection.  CT head is negative.  Keppra was loaded per neurologist recommendation in [facility] hospital.  Patient did not pass swallowing screen per nurse report.  -Placed on telemetry bed for observation -Frequent neuro check -Continue Keppra: 500 mg twice daily -Follow-up EEG and MRI for brain -Hold all home oral medications, including all sedative medications.  Hypokalemia: K= 2.6 on admission. - Repleted with 10 mEq x 2 in [facility], will give 10 mEq x 4 - Check Mg level - Give 1 g of magnesium sulfate  COPD (chronic obstructive pulmonary disease) (HCC) and asthma: stable. -atrovent nebs and prn albuterol nebs  GERD (gastroesophageal reflux disease): -IV pepcid  HLD (hyperlipidemia): -hold lipitor  Tobacco abuse: -Nicotine patch   Psych issues: Panic attacks, depression, anxiety, bipolar disorder -Hold home medications until mental status improves  CAD (coronary artery disease): Does not seem to have chest pain. - Hold her Lipitor and nitroglycerin   DVT ppx:   SQ Lovenox Code Status: Full code Family Communication: None at bed side.      Disposition Plan:  Anticipate discharge back to previous home environment Consults called: none Admission status: Obs / tele     Date of Service 11/29/2017    Lorretta Harp [facility] Pager [phone]  If 7PM-7AM, please contact night-coverage [email or web address] Password [facility] 11/29/2017, 6:07 AM

## 2017-11-28 NOTE — Progress Notes (Signed)
Patient transferred from [facility], arrived into the unit at 2245 via EMS. Patient is alert and confused, with delayed responses and forgetful.  Skin assessment completed with another nurse. Respirations even and unlabored, patient denied pain at this time, no acute distress noted.

## 2017-11-28 NOTE — Telephone Encounter (Signed)
Second attempt to reach Ms. Debbie Gay but mailbox is full and unable to accept messages at this time.

## 2017-11-28 NOTE — ED Notes (Signed)
Date and time results received: 11/28/17 1517 (use smartphrase ".now" to insert current time)  Test: Potassium Critical Value: 2.6  Name of Provider Notified: McShane   Orders Received? Or Actions Taken?: IV Potassium

## 2017-11-28 NOTE — ED Triage Notes (Signed)
Pt arrived via EMS from home c/o altered LOC. Per EMS, according to people at her home she has been sitting in the floor since yesterday and has been having difficulty urinating.

## 2017-11-28 NOTE — ED Provider Notes (Addendum)
[facility] Emergency Department Provider Note  ____________________________________________   I have reviewed the triage vital signs and the nursing notes. Where available I have reviewed prior notes and, if possible and indicated, outside hospital notes.    HISTORY  Chief Complaint Altered Mental Status    HPI Debbie Gay is a 47 y.o. female  With a history of asthma, bipolar disorder, chronic pain, chronic narcotic pain medication, history of anxiety, on Xanax in the past, history of chronic abdominal pain history of "tailbone pain" that keeps her out of work according to her mother, who stays with her grandmother.  I was not able to get much history out of the daughter, according to EMS, she has been not particularly active over the last couple days and "not acting right" since yesterday.  Very limited history from patient or family.  I therefore called her mother.  Her mother states that the patient stays with the patient's grandmother and the patient's aunt but they are "not keeping track of her very closely".  According to the mother, the other family members reported that she "was not acting quite right" over the last couple days, and then this morning would only repeat back questions to them.  They noticed that her pupils were dilated.  She did not fall but she then sat down on the floor.  She sat down the floor this morning.  It was witnessed.  According to family there was no trauma.  And they called 911 because when her mother called the patient the patient was having difficulty making sense when she spoke to her.  Patient herself cannot give any history she will only tell me her name she will answer directly to questions and follow commands but she will provide any history.  She states "I do not know".  Level 5 chart caveat; no further history available due to patient status.  Mother states that she has not been depressed recently that she knows of and is not  likely to take an overdose although she would not rule out taking other substances and is around meth etc.    Past Medical History:  Diagnosis Date  . Asthma   . Bipolar 1 disorder (HCC)   . Chronic pain   . COPD (chronic obstructive pulmonary disease) (HCC)   . GERD (gastroesophageal reflux disease)   . History of heart attack   . Myocardial infarction (HCC)   . Panic attacks     Patient Active Problem List   Diagnosis Date Noted  . Hypokalemia 11/22/2017  . Nausea and vomiting 11/21/2017  . Intractable nausea and vomiting 11/21/2017    Past Surgical History:  Procedure Laterality Date  . ABDOMINAL HYSTERECTOMY    . COLONOSCOPY WITH PROPOFOL N/A 10/30/2014   Procedure: COLONOSCOPY WITH PROPOFOL;  Surgeon: Christena Deem, MD;  Location: [facility] ENDOSCOPY;  Service: Endoscopy;  Laterality: N/A;  . ESOPHAGOGASTRODUODENOSCOPY (EGD) WITH PROPOFOL N/A 10/30/2014   Procedure: ESOPHAGOGASTRODUODENOSCOPY (EGD) WITH PROPOFOL;  Surgeon: Christena Deem, MD;  Location: [facility] ENDOSCOPY;  Service: Endoscopy;  Laterality: N/A;  . ESOPHAGOGASTRODUODENOSCOPY (EGD) WITH PROPOFOL N/A 11/23/2017   Procedure: ESOPHAGOGASTRODUODENOSCOPY (EGD) WITH PROPOFOL;  Surgeon: Toledo, Boykin Nearing, MD;  Location: [facility] ENDOSCOPY;  Service: Gastroenterology;  Laterality: N/A;    Prior to Admission medications   Medication Sig Start Date End Date Taking? Authorizing Provider  acetaminophen (TYLENOL) 325 MG tablet Take 650 mg by mouth daily as needed for mild pain or moderate pain.  [provider]  albuterol (PROVENTIL HFA;VENTOLIN HFA) 108 (90 Base) MCG/ACT inhaler Inhale 2 puffs into the lungs every 6 (six) hours as needed for wheezing or shortness of breath.     [provider]  alprazolam Prudy Feeler) 2 MG tablet Take 2 mg by mouth 3 (three) times daily.    [provider]  atorvastatin (LIPITOR) 20 MG tablet Take 20 mg by mouth daily.    [provider]   diphenhydramine-acetaminophen (TYLENOL PM) 25-500 MG TABS tablet Take 1 tablet by mouth at bedtime as needed (for sleep).     [provider]  FLUoxetine (PROZAC) 40 MG capsule Take 40 mg by mouth daily.    [provider]  KLOR-CON M10 10 MEQ tablet Take 20 mEq by mouth 2 (two) times daily.    [provider]  metoCLOPramide (REGLAN) 10 MG tablet Take 10 mg by mouth every 6 (six) hours as needed for nausea.     [provider]  nitroGLYCERIN (NITROLINGUAL) 0.4 MG/SPRAY spray Place 1 spray under the tongue every 5 (five) minutes as needed for chest pain.     [provider]  Oxycodone HCl 10 MG TABS Take 20 mg by mouth 3 (three) times daily.    [provider]  pantoprazole (PROTONIX) 40 MG tablet Take 1 tablet (40 mg total) by mouth daily. 11/23/17 01/22/18  Auburn Bilberry, MD  PREMARIN 1.25 MG tablet Take 1.25 mg by mouth daily.    [provider]  promethazine (PHENERGAN) 25 MG tablet Take 25 mg by mouth every 4 (four) hours as needed for nausea or vomiting.     [provider]  QUEtiapine (SEROQUEL) 100 MG tablet Take 100-200 mg by mouth at bedtime.    [provider]  sucralfate (CARAFATE) 1 g tablet Take 1 g by mouth 4 (four) times daily.    [provider]  tiotropium (SPIRIVA) 18 MCG inhalation capsule Place 18 mcg into inhaler and inhale daily as needed (for wheezing/shortness of breath).     [provider]    Allergies Levaquin [levofloxacin]; Prednisone; and Penicillins  Family History  Problem Relation Age of Onset  . COPD Mother   . Irritable bowel syndrome Mother   . Glaucoma Father   . Hyperlipidemia Father     Social History Social History   Tobacco Use  . Smoking status: Current Every Day Smoker    Packs/day: 0.50    Years: 20.00    Pack years: 10.00  . Smokeless tobacco: Never Used  Substance Use Topics  . Alcohol use: No  . Drug use: No    Review of  Systems Constitutional: No fever/chills Eyes: No visual changes. ENT: No sore throat. No stiff neck no neck pain Cardiovascular: Denies chest pain. Respiratory: Denies shortness of breath. Gastrointestinal:   no vomiting.  No diarrhea.  No constipation. Genitourinary: Negative for dysuria. Musculoskeletal: Negative lower extremity swelling Skin: Negative for rash. Neurological: Negative for severe headaches, focal weakness or numbness.   ____________________________________________   PHYSICAL EXAM:  VITAL SIGNS: ED Triage Vitals  Enc Vitals Group     BP 11/28/17 1440 104/65     Pulse Rate 11/28/17 1440 98     Resp 11/28/17 1440 13     Temp 11/28/17 1440 98.2 F (36.8 C)     Temp Source 11/28/17 1440 Oral     SpO2 11/28/17 1439 99 %     Weight 11/28/17 1443 130 lb 4.7 oz (59.1 kg)  Height 11/28/17 1443 5\' 6"  (1.676 m)     Head Circumference --      Peak Flow --      Pain Score 11/28/17 1442 10     Pain Loc --      Pain Edu? --      Excl. in GC? --     Constitutional: Is alert she will tell me her name the year and where she is but she only answers and monosyllabic responses and will not elaborate.  She would not answer questions about why she is here or what is been going on. Eyes: Conjunctivae are normal pupils are large and round reactive Head: Atraumatic HEENT: No congestion/rhinnorhea. Mucous membranes are moist.  Oropharynx non-erythematous Neck:   Nontender with no meningismus, no masses, no stridor Cardiovascular: Normal rate, regular rhythm. Grossly normal heart sounds.  Good peripheral circulation. Respiratory: Normal respiratory effort.  No retractions. Lungs CTAB. Abdominal: Soft and nontender. No distention. No guarding no rebound Back:  There is no focal tenderness or step off.  there is no midline tenderness there are no lesions noted. there is no CVA tenderness Musculoskeletal: No lower extremity tenderness, no upper extremity tenderness. No joint  effusions, no DVT signs strong distal pulses mild bilateral lower extremity symmetric edema Neurologic: Neurologic exam given patient status, but no focal neurologic deficit obviously noted. Skin:  Skin is warm, dry and intact. No rash noted. Psychiatric: Mood and affect are bizarre and flat. Speech and behavior are normal.  ____________________________________________   LABS (all labs ordered are listed, but only abnormal results are displayed)  Labs Reviewed  GLUCOSE, CAPILLARY - Abnormal; Notable for the following components:      Result Value   Glucose-Capillary 104 (*)    All other components within normal limits  COMPREHENSIVE METABOLIC PANEL - Abnormal; Notable for the following components:   Potassium 2.6 (*)    Glucose, Bld 119 (*)    Calcium 8.8 (*)    Albumin 3.2 (*)    Total Bilirubin 2.2 (*)    Anion gap 17 (*)    All other components within normal limits  CBC - Abnormal; Notable for the following components:   RBC 3.85 (*)    MCV 100.3 (*)    MCH 34.3 (*)    RDW 16.3 (*)    All other components within normal limits  AMMONIA  ACETAMINOPHEN LEVEL  ETHANOL  URINALYSIS, COMPLETE (UACMP) WITH MICROSCOPIC  URINE DRUG SCREEN, QUALITATIVE ([facility] ONLY)  SALICYLATE LEVEL  TROPONIN I  CK  MAGNESIUM    Pertinent labs  results that were available during my care of the patient were reviewed by me and considered in my medical decision making (see chart for details). ____________________________________________  EKG  I personally interpreted any EKGs ordered by me or triage This rhythm rate 97 bpm no acute ST elevation or depression normal axis nonspecific ST changes, ____________________________________________  RADIOLOGY  Pertinent labs & imaging results that were available during my care of the patient were reviewed by me and considered in my medical decision making (see chart for details). If possible, patient and/or family made aware of any abnormal  findings.  Ct Head Wo Contrast  Result Date: 11/28/2017 CLINICAL DATA:  Altered mental status EXAM: CT HEAD WITHOUT CONTRAST TECHNIQUE: Contiguous axial images were obtained from the base of the skull through the vertex without intravenous contrast. COMPARISON:  07/18/2015 FINDINGS: Brain: No evidence of acute infarction, hemorrhage, hydrocephalus, extra-axial collection or mass lesion/mass effect. Vascular: No hyperdense  vessel or unexpected calcification. Skull: Normal. Negative for fracture or focal lesion. Sinuses/Orbits: No acute finding. Other: None. IMPRESSION: Normal head CT Electronically Signed   By: Alcide CleverMark  Lukens M.D.   On: 11/28/2017 15:07   Dg Chest Port 1 View  Result Date: 11/28/2017 CLINICAL DATA:  Altered mental status. EXAM: PORTABLE CHEST 1 VIEW COMPARISON:  10/10/2017 FINDINGS: Heart size and pulmonary vascularity are normal. Minimal atelectasis at the lung bases. No effusions or infiltrates. No acute bone abnormality. Previous open reduction and internal fixation of multiple left rib fractures. IMPRESSION: Minimal atelectasis at the lung bases. Electronically Signed   By: Francene BoyersJames  Maxwell M.D.   On: 11/28/2017 15:35   ____________________________________________    PROCEDURES  Procedure(s) performed: None  Procedures  Critical Care performed: None  ____________________________________________   INITIAL IMPRESSION / ASSESSMENT AND PLAN / ED COURSE  Pertinent labs & imaging results that were available during my care of the patient were reviewed by me and considered in my medical decision making (see chart for details).  Patient here for "acting weird" at home.  She does seem to be detached in her interactions with us.  However vital signs are reassuring.  We do notice her potassium is low and we are repleting that.  Magnesium, salicylate, EtOH and other possible overdose toxicology screening is pending.  Chest x-ray is reassuring CT of the head is reassuring.   Differential includes toxidrome, low suspicion for opiate overdose, patient is awake and alert and does not seem to have depressed respiratory status with large pupils, anticholinergic toxidrome certainly could be possible.  No known history of SI or HI, Tylenol level should help possibly.  Unclear exactly how long she is been acting differently.  No known history of overdose. Psychogenic catatonia certainly could be also possibly on the differential.  We will watch her closely here.  No evidence of acute infection noted, no evidence of meningismus or fever.  ----------------------------------------- 5:22 PM on 11/28/2017 -----------------------------------------  At baseline, patient is more awake and certainly can talk to me much more, however she still not providing much of a history, she states she just does not feel very good.  She does have something of a mildly disconjugate gaze and some mild twitching around her face even though she is awake and alert.  I did ask if she is ever had a seizure and she states yes although this does not seem to be sending the family knows about and she cannot give me any more history than that.  There is always a chance that she is a partial seizure I suppose, we will give her a dose of Ativan to see if that helps thus far though otherwise her work-up has been unremarkable.  We will likely have to do a in and out catheterization for urine.  ----------------------------------------- 6:57 PM on 11/28/2017 -----------------------------------------  Discussed with Dr. Loretha BrasilZeylikman, of neurology, he agrees with giving the patient Keppra which we have ordered at the dose he requested.  Patient remains awake alert and guarding her airway, I do not see the twitching activity since I gave the Ativan while I am in the room.  We will admit the patient.  We will continue to monitor her.  ----------------------------------------- 7:36 PM on  11/28/2017 -----------------------------------------  Has been loaded with Keppra, I do not see any obvious twitching activity she still seems quite altered to me.  Family states that this may have started actually "over the weekend".  The history remains very spotty.  On  Monday, she has someone else to drive because she states that she was having some trouble focusing.  On the 22nd she did have a GI procedure.  In any event, patient will likely require EEG monitoring which cannot be done at this hospital.  She will also likely require an MRI, will likely get an MRI though will be a long delay and patient's family would prefer not to have that done here as either way she will need to go to [facility] health they are requesting [facility] transfer at this time which we have requested through the transfer center.  We are waiting a call back.  She remains awake hurting her airway here.  Differential still includes partial seizures, toxigenic pathology, CVA, other metabolic as yet unidentified pathology.  Fever, no elevated white count nothing at this time to suggest infectious etiology and given that she is now had symptoms for 4 days we will defer tap for prior imaging and EEG study gust  ----------------------------------------- 7:54 PM on 11/28/2017 -----------------------------------------  I discussed with Dr. Toniann Fail, who agrees with management, requesting more potassium which I am giving, and we will send her to [facility] at the request of her family.  He does state that she had staring fits like this years ago.  They also state that she is very depressed going into the first holiday without her daughter who was recently taken away from her and given to her husband.  The possibility of catatonic depression certainly is present as well.   ----------------------------------------- 9:01 PM on 11/28/2017 -----------------------------------------  Signed out to dr. Derrill Kay at the end of my shift  pending transfer.  ____________________________________________   FINAL CLINICAL IMPRESSION(S) / ED DIAGNOSES  Final diagnoses:  Altered mental status      This chart was dictated using voice recognition software.  Despite best efforts to proofread,  errors can occur which can change meaning.      Jeanmarie Plant, MD 11/28/17 1604    Jeanmarie Plant, MD 11/28/17 1723    Jeanmarie Plant, MD 11/28/17 1857    Jeanmarie Plant, MD 11/28/17 Ninfa Linden    Jeanmarie Plant, MD 11/28/17 1954    Jeanmarie Plant, MD 11/28/17 2011    Jeanmarie Plant, MD 11/28/17 2102

## 2017-11-29 ENCOUNTER — Observation Stay (HOSPITAL_COMMUNITY): Payer: Medicaid Other

## 2017-11-29 ENCOUNTER — Encounter (HOSPITAL_COMMUNITY): Payer: Self-pay | Admitting: Internal Medicine

## 2017-11-29 DIAGNOSIS — E785 Hyperlipidemia, unspecified: Secondary | ICD-10-CM | POA: Diagnosis present

## 2017-11-29 DIAGNOSIS — E876 Hypokalemia: Secondary | ICD-10-CM | POA: Diagnosis not present

## 2017-11-29 DIAGNOSIS — K219 Gastro-esophageal reflux disease without esophagitis: Secondary | ICD-10-CM | POA: Diagnosis present

## 2017-11-29 DIAGNOSIS — J45909 Unspecified asthma, uncomplicated: Secondary | ICD-10-CM | POA: Diagnosis present

## 2017-11-29 DIAGNOSIS — F319 Bipolar disorder, unspecified: Secondary | ICD-10-CM | POA: Diagnosis not present

## 2017-11-29 DIAGNOSIS — E782 Mixed hyperlipidemia: Secondary | ICD-10-CM | POA: Diagnosis present

## 2017-11-29 DIAGNOSIS — F410 Panic disorder [episodic paroxysmal anxiety] without agoraphobia: Secondary | ICD-10-CM | POA: Diagnosis present

## 2017-11-29 DIAGNOSIS — F32A Depression, unspecified: Secondary | ICD-10-CM | POA: Diagnosis present

## 2017-11-29 DIAGNOSIS — J449 Chronic obstructive pulmonary disease, unspecified: Secondary | ICD-10-CM | POA: Diagnosis not present

## 2017-11-29 DIAGNOSIS — F419 Anxiety disorder, unspecified: Secondary | ICD-10-CM | POA: Diagnosis present

## 2017-11-29 DIAGNOSIS — F329 Major depressive disorder, single episode, unspecified: Secondary | ICD-10-CM | POA: Diagnosis present

## 2017-11-29 DIAGNOSIS — G9341 Metabolic encephalopathy: Secondary | ICD-10-CM | POA: Diagnosis present

## 2017-11-29 DIAGNOSIS — I2510 Atherosclerotic heart disease of native coronary artery without angina pectoris: Secondary | ICD-10-CM | POA: Diagnosis present

## 2017-11-29 DIAGNOSIS — Z720 Tobacco use: Secondary | ICD-10-CM | POA: Diagnosis present

## 2017-11-29 LAB — PROTEIN AND GLUCOSE, CSF
Glucose, CSF: 53 mg/dL (ref 40–70)
Total  Protein, CSF: 38 mg/dL (ref 15–45)

## 2017-11-29 LAB — CSF CELL COUNT WITH DIFFERENTIAL
RBC COUNT CSF: 1 /mm3 — AB
Tube #: 1
WBC, CSF: 2 /mm3 (ref 0–5)

## 2017-11-29 LAB — CBC
HCT: 37.4 % (ref 36.0–46.0)
Hemoglobin: 12.1 g/dL (ref 12.0–15.0)
MCH: 33 pg (ref 26.0–34.0)
MCHC: 32.4 g/dL (ref 30.0–36.0)
MCV: 101.9 fL — ABNORMAL HIGH (ref 80.0–100.0)
PLATELETS: 290 10*3/uL (ref 150–400)
RBC: 3.67 MIL/uL — ABNORMAL LOW (ref 3.87–5.11)
RDW: 16.3 % — ABNORMAL HIGH (ref 11.5–15.5)
WBC: 7.9 10*3/uL (ref 4.0–10.5)
nRBC: 0 % (ref 0.0–0.2)

## 2017-11-29 LAB — MAGNESIUM: MAGNESIUM: 2.3 mg/dL (ref 1.7–2.4)

## 2017-11-29 LAB — BASIC METABOLIC PANEL
ANION GAP: 12 (ref 5–15)
BUN: 15 mg/dL (ref 6–20)
CALCIUM: 8.2 mg/dL — AB (ref 8.9–10.3)
CO2: 24 mmol/L (ref 22–32)
Chloride: 102 mmol/L (ref 98–111)
Creatinine, Ser: 0.81 mg/dL (ref 0.44–1.00)
GFR calc Af Amer: >60 mL/min (ref >60)
GFR calc non Af Amer: >60 mL/min (ref >60)
Glucose, Bld: 89 mg/dL (ref 70–99)
POTASSIUM: 2.5 mmol/L — AB (ref 3.5–5.1)
Sodium: 138 mmol/L (ref 135–145)

## 2017-11-29 MED ORDER — LEVALBUTEROL HCL 1.25 MG/0.5ML IN NEBU
1.2500 mg | INHALATION_SOLUTION | Freq: Four times a day (QID) | RESPIRATORY_TRACT | Status: DC
  Administered 2017-11-29 (×3): 1.25 mg via RESPIRATORY_TRACT
  Filled 2017-11-29 (×3): qty 0.5

## 2017-11-29 MED ORDER — POTASSIUM CHLORIDE 10 MEQ/100ML IV SOLN
10.0000 meq | INTRAVENOUS | Status: AC
  Administered 2017-11-29 (×5): 10 meq via INTRAVENOUS
  Filled 2017-11-29 (×5): qty 100

## 2017-11-29 MED ORDER — VALPROATE SODIUM 500 MG/5ML IV SOLN
500.0000 mg | Freq: Three times a day (TID) | INTRAVENOUS | Status: DC
  Administered 2017-11-30 – 2017-12-01 (×6): 500 mg via INTRAVENOUS
  Filled 2017-11-29 (×8): qty 5

## 2017-11-29 MED ORDER — IOPAMIDOL (ISOVUE-370) INJECTION 76%
INTRAVENOUS | Status: AC
  Filled 2017-11-29: qty 100

## 2017-11-29 MED ORDER — THIAMINE HCL 100 MG/ML IJ SOLN
500.0000 mg | Freq: Three times a day (TID) | INTRAVENOUS | Status: DC
  Administered 2017-11-29 – 2017-12-02 (×8): 500 mg via INTRAVENOUS
  Filled 2017-11-29 (×9): qty 5

## 2017-11-29 MED ORDER — ACETAMINOPHEN 650 MG RE SUPP: 650.0000 mg | Freq: Four times a day (QID) | RECTAL | Status: DC | PRN

## 2017-11-29 MED ORDER — POTASSIUM CHLORIDE 10 MEQ/100ML IV SOLN
10.0000 meq | INTRAVENOUS | Status: DC
  Filled 2017-11-29: qty 100

## 2017-11-29 MED ORDER — LEVALBUTEROL HCL 1.25 MG/0.5ML IN NEBU
1.2500 mg | INHALATION_SOLUTION | Freq: Two times a day (BID) | RESPIRATORY_TRACT | Status: DC
  Administered 2017-11-30 – 2017-12-01 (×3): 1.25 mg via RESPIRATORY_TRACT
  Filled 2017-11-29 (×8): qty 0.5

## 2017-11-29 MED ORDER — OXYCODONE HCL 5 MG PO TABS
10.0000 mg | ORAL_TABLET | Freq: Three times a day (TID) | ORAL | Status: DC | PRN
  Administered 2017-11-30 – 2017-12-02 (×5): 10 mg via ORAL
  Filled 2017-11-29 (×5): qty 2

## 2017-11-29 MED ORDER — FAMOTIDINE IN NACL 20-0.9 MG/50ML-% IV SOLN
20.0000 mg | Freq: Two times a day (BID) | INTRAVENOUS | Status: DC
  Administered 2017-11-30 (×2): 20 mg via INTRAVENOUS
  Filled 2017-11-29 (×4): qty 50

## 2017-11-29 MED ORDER — NICOTINE 21 MG/24HR TD PT24
21.0000 mg | MEDICATED_PATCH | Freq: Every day | TRANSDERMAL | Status: DC
  Administered 2017-11-29 – 2017-12-02 (×4): 21 mg via TRANSDERMAL
  Filled 2017-11-29 (×4): qty 1

## 2017-11-29 MED ORDER — LEVETIRACETAM IN NACL 1000 MG/100ML IV SOLN
1000.0000 mg | Freq: Two times a day (BID) | INTRAVENOUS | Status: DC
  Filled 2017-11-29: qty 100

## 2017-11-29 MED ORDER — IPRATROPIUM BROMIDE 0.02 % IN SOLN
0.5000 mg | RESPIRATORY_TRACT | Status: DC
  Administered 2017-11-29: 0.5 mg via RESPIRATORY_TRACT
  Filled 2017-11-29: qty 2.5

## 2017-11-29 MED ORDER — POTASSIUM CHLORIDE 10 MEQ/100ML IV SOLN
10.0000 meq | Freq: Once | INTRAVENOUS | Status: AC
  Administered 2017-11-29: 10 meq via INTRAVENOUS
  Filled 2017-11-29: qty 100

## 2017-11-29 MED ORDER — ENOXAPARIN SODIUM 40 MG/0.4ML ~~LOC~~ SOLN
40.0000 mg | Freq: Every day | SUBCUTANEOUS | Status: DC
  Filled 2017-11-29 (×2): qty 0.4

## 2017-11-29 MED ORDER — ACETAMINOPHEN 325 MG PO TABS
650.0000 mg | ORAL_TABLET | Freq: Four times a day (QID) | ORAL | Status: DC | PRN
  Filled 2017-11-29: qty 2

## 2017-11-29 MED ORDER — IPRATROPIUM BROMIDE 0.02 % IN SOLN
0.5000 mg | Freq: Two times a day (BID) | RESPIRATORY_TRACT | Status: DC
  Administered 2017-11-30 – 2017-12-01 (×3): 0.5 mg via RESPIRATORY_TRACT
  Filled 2017-11-29 (×6): qty 2.5

## 2017-11-29 MED ORDER — DIPHENHYDRAMINE HCL 25 MG PO CAPS: 25.0000 mg | ORAL_CAPSULE | Freq: Three times a day (TID) | ORAL | Status: DC | PRN

## 2017-11-29 MED ORDER — IOPAMIDOL (ISOVUE-370) INJECTION 76%
75.0000 mL | Freq: Once | INTRAVENOUS | Status: AC | PRN
  Administered 2017-11-29: 75 mL via INTRAVENOUS

## 2017-11-29 MED ORDER — IPRATROPIUM BROMIDE 0.02 % IN SOLN
0.5000 mg | Freq: Four times a day (QID) | RESPIRATORY_TRACT | Status: DC
  Administered 2017-11-29 (×3): 0.5 mg via RESPIRATORY_TRACT
  Filled 2017-11-29 (×3): qty 2.5

## 2017-11-29 MED ORDER — POTASSIUM CHLORIDE 10 MEQ/100ML IV SOLN
10.0000 meq | INTRAVENOUS | Status: AC
  Administered 2017-11-29 (×3): 10 meq via INTRAVENOUS
  Filled 2017-11-29 (×2): qty 100

## 2017-11-29 MED ORDER — LORAZEPAM 2 MG/ML IJ SOLN: 1.0000 mg | INTRAMUSCULAR | Status: DC | PRN

## 2017-11-29 MED ORDER — MAGNESIUM SULFATE IN D5W 1-5 GM/100ML-% IV SOLN
1.0000 g | Freq: Once | INTRAVENOUS | Status: AC
  Administered 2017-11-29: 1 g via INTRAVENOUS
  Filled 2017-11-29: qty 100

## 2017-11-29 MED ORDER — LEVETIRACETAM IN NACL 500 MG/100ML IV SOLN
500.0000 mg | Freq: Two times a day (BID) | INTRAVENOUS | Status: DC
  Administered 2017-11-29: 500 mg via INTRAVENOUS
  Filled 2017-11-29: qty 100

## 2017-11-29 MED ORDER — LEVETIRACETAM IN NACL 1000 MG/100ML IV SOLN
1000.0000 mg | Freq: Once | INTRAVENOUS | Status: AC
  Administered 2017-11-29: 1000 mg via INTRAVENOUS
  Filled 2017-11-29: qty 100

## 2017-11-29 NOTE — Progress Notes (Signed)
Patient is back to the room 

## 2017-11-29 NOTE — Progress Notes (Signed)
Will do NIF @ 0800.

## 2017-11-29 NOTE — Progress Notes (Signed)
Informed Dr. David StallFeliz Ortiz that patients bladder scan shows 891 in bladder. MD enquired about getting patient up to bedside commode.Informed MD that patients legs buckle upon standing. Per MD in and out cath patient.

## 2017-11-29 NOTE — Procedures (Signed)
History: 47 year old female being evaluated for seizures and altered mental status  Sedation: None  Technique: This is a 21 channel routine scalp EEG performed at the bedside with bipolar and monopolar montages arranged in accordance to the international 10/20 system of electrode placement. One channel was dedicated to EKG recording.    Background: There is a posterior dominant rhythm of 9 Hz which is well sustained.  The background is sharply contoured due to excessive beta activity.  The entire recording is also high voltage and reviewed at a sensitivity of 15 uV.  There is frequent very prominent rhythmic centrally predominant theta activity without evolution consistent with a ciganek rhythm(normal variant). There is also generalized irregular delta and theta activities.  Photic stimulation: Physiologic driving is not performed  EEG Abnormalities: 1) Generalized slow activity.  2) excess beta activity  Clinical Interpretation: This EEG is consistent with a generalized non-specific cerebral dysfunction(encephalopathy).  There was no seizure or seizure predisposition recorded on this study. Please note that lack of epileptiform activity on EEG does not preclude the possibility of epilepsy.   Ritta SlotMcNeill Kirkpatrick, MD [facility] [phone]  If 7pm- 7am, please page neurology on call as listed in AMION.

## 2017-11-29 NOTE — Procedures (Signed)
Indication: 47 year old being evaluated with altered mental status  Risks of the procedure were dicussed with the patient including post-LP headache, bleeding, infection, weakness/numbness of legs(radiculopathy), death.  The patient/patient's proxy agreed and written consent was obtained.   The patient was prepped and draped, and using sterile technique a 20 gauge quinke spinal needle was inserted in the 4 5 space. The opening pressure was 10. Approximately 8 cc of CSF were obtained and sent for analysis.    Ritta SlotMcNeill Buzz Axel, MD [facility] [phone]  If 7pm- 7am, please page neurology on call as listed in AMION.

## 2017-11-29 NOTE — Consult Note (Addendum)
Neurology Consultation Reason for Consult: Altered mental status Referring Physician: Please Robb MatarOrtiz, a  CC: Altered mental status  History is obtained from: Patient, chart, father  HPI: Debbie Gay is a 47 y.o. female with a history of bipolar disorder who presents with altered mental status.  She has been having nausea and difficulty eating for at least a few weeks due to duodenal obstruction stricture.  Apparently it was going on for 3 weeks when she was admitted to [facility] on 11/20.  When she was discharged on 11/22, her father saw her and she was walking at that time, though he states she was holding onto things because she was just generally weak.  She was thinking clearly.  She was then brought to the emergency department again on 11/27.  She had apparently been sitting on the floor since 11/26 and having difficulty urinating.  In the emergency department, she had an episode concerning for seizure so she was loaded on Keppra.  Because there was no EEG availability on Thanksgiving she was transferred to [facility] for further evaluation.  After arrival here, an MRI was performed which demonstrates medial temporal T2 change.  ROS:  Unable to obtain due to altered mental status.   Past Medical History:  Diagnosis Date  . Asthma   . Bipolar 1 disorder (HCC)   . Chronic pain   . COPD (chronic obstructive pulmonary disease) (HCC)   . Depression   . GERD (gastroesophageal reflux disease)   . History of heart attack   . Myocardial infarction (HCC)   . Panic attacks      Family History  Problem Relation Age of Onset  . COPD Mother   . Irritable bowel syndrome Mother   . Glaucoma Father   . Hyperlipidemia Father      Social History:  reports that she has been smoking. She has a 10.00 pack-year smoking history. She has never used smokeless tobacco. She reports that she does not drink alcohol or use drugs.   Exam: Current vital signs: BP 120/67 (BP Location: Left  Arm)   Pulse 99   Temp 98.8 F (37.1 C) (Oral)   Resp (!) 22   Wt 65.2 kg   LMP  (LMP Unknown)   SpO2 100%   BMI 23.20 kg/m  Vital signs in last 24 hours: Temp:  [98.4 F (36.9 C)-99 F (37.2 C)] 98.8 F (37.1 C) (11/28 1440) Pulse Rate:  [91-101] 99 (11/28 1441) Resp:  [15-22] 22 (11/28 1441) BP: (96-120)/(61-69) 120/67 (11/28 1440) SpO2:  [96 %-100 %] 100 % (11/28 1441) Weight:  [65.2 kg] 65.2 kg (11/27 2302)   Physical Exam  Constitutional: Appears well-developed and well-nourished.  Psych: Affect appropriate to situation Eyes: No scleral injection HENT: No OP obstrucion Head: Normocephalic.  Cardiovascular: Normal rate and regular rhythm.  Respiratory: Effort normal, non-labored breathing GI: Soft.  No distension. There is no tenderness.  Skin: WDI  Neuro: Mental Status: Patient is awake, alert, oriented to person, place, month, year, and situation. Patient is able to give a clear and coherent history. No signs of aphasia or neglect Cranial Nerves: II: Visual Fields are full. Pupils are equal, round, and reactive to light.   III,IV, VI: She has limitations in all directions of gaze, though very much most prominent in abduction bilaterally.  V: Facial sensation is symmetric to temperature VII: Facial movement is symmetric.  VIII: hearing is intact to voice X: Uvula elevates symmetrically XI: Shoulder shrug is symmetric. XII: tongue  is midline without atrophy or fasciculations.  Motor: She gives very poor effort to confrontational testing, though she is able to lift all extremities against gravity once she is prompted. Sensory: Sensation is diminished to light touch to the mid arm bilaterally, decreased in the legs Deep Tendon Reflexes: Diffusely absent  Cerebellar: She is markedly ataxic in all 4 extremities  I have reviewed labs in epic and the results pertinent to this consultation are: Hypokalemia  I have reviewed the images obtained: MRI brain-medial  temporal change  Impression: 47 year old female with persistent nausea and vomiting for multiple weeks who presents with ataxia, ophthalmoplegia, encephalopathy, areflexia.  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  This fits very well with the MRI imaging.  One other consideration with areflexia in the symptoms would be Bickerstaff brainstem encephalitis coupled with Christianne Dolin syndrome which are both frequently anti-GQ1b entities.  Seizures are less common with nonalcoholic Wernicke's encephalopathy but they have been described, and they are also uncommon with Bickerstaff's.  Also with bipolar history, will use depakote rather than keppra for seizure prevention.   Recommendations: 1) LP for protein, glucose, cells, oligoclonal bands 2) thiamine level 3) start high-dose thiamine 500 3 times daily following lab draw 4) anti-GQ 1B antibodies 5) d/c keppra, start depakote.  6) may need to consider IVIG if she continues to worsen. 7) d/c keppra after tonights dose 8) start depakote 500mg  TID  Ritta Slot, MD [facility] [phone]  If 7pm- 7am, please page neurology on call as listed in AMION.

## 2017-11-29 NOTE — Progress Notes (Signed)
Patient has not voided since admission into unit. Bladder scan showed 650ml urine in bladder. Order obtained for in/out catherization. In/out performed with 9016fr plastic catheter, patient tolerated well no acute distress noted. 800ml drained.

## 2017-11-29 NOTE — Progress Notes (Signed)
Patient left the floor for MRI

## 2017-11-29 NOTE — Progress Notes (Signed)
[facility] PROGRESS NOTE    Progress Note  Debbie BrookeCarrie L Souter  UUV:[id number] DOB: 10-Dec-1970 DOA: 11/28/2017 PCP: Nicolasa DuckingGarlick, William, MD     Brief Narrative:   Debbie BrookeCarrie L Gay is an 47 y.o. 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 who presents with confusion transferred here from [place] patient unable to provide the history so most of the history is obtained from the chart, recently admitted to [facility] for nausea and vomiting, due to duodenal stricture, patient was found to be confused by the family after discharge and brought back to the hospital in the ED she started having muscle twitching and there was a concern for complex past partial seizures, neurology at [facility] advised Keppra loading, due to the lack of availability of the EEG she was transferred to [facility], in the ED she had awaken of 8.2, negative tropes UDS positive for benzos and trach cyclics, Tylenol level and alcohol level negative hypokalemia 2.6 normal creat.  CT head showed no acute findings.  Assessment/Plan:   Acute metabolic encephalopathy: Of unclear etiology question polypharmacy, encephalitis, complex seizures or infectious etiology. I of the brain showed concerning changes, will consult neurology Continue Keppra, EEG is pending.  Will hold all sedative medications. This morning she is more clear she is able to carry conversation and answer questions she knows what medication she is on.  Question of her encephalopathy is likely more polypharmacy.  Hypokalemia: Replete potassium IV, recheck tomorrow morning.  COPD (chronic obstructive pulmonary disease) (HCC) At baseline continue inhalers.  GERD (gastroesophageal reflux disease) Continue Pepcid. Panic attacks  Bipolar 1 disorder (HCC) Hold meds for now.  Depression Stable hold meds for  now.   DVT prophylaxis: lovenox Family Communication:none Disposition Plan/Barrier to D/C: unable to determine Code Status:     Code Status Orders  (From admission, onward)         Start     Ordered   11/29/17 0115  Full code  Continuous     11/29/17 0115        Code Status History    Date Active Date Inactive Code Status Order ID Comments User Context   11/21/2017 1405 11/23/2017 1836 Full Code [id number]  [place] SirenSainani, Vivek J, MD Inpatient        IV Access:    Peripheral IV   Procedures and diagnostic studies:   Ct Head Wo Contrast  Result Date: 11/28/2017 CLINICAL DATA:  Altered mental status EXAM: CT HEAD WITHOUT CONTRAST TECHNIQUE: Contiguous axial images were obtained from the base of the skull through the vertex without intravenous contrast. COMPARISON:  07/18/2015 FINDINGS: Brain: No evidence of acute infarction, hemorrhage, hydrocephalus, extra-axial collection or mass lesion/mass effect. Vascular: No hyperdense vessel or unexpected calcification. Skull: Normal. Negative for fracture or focal lesion. Sinuses/Orbits: No acute finding. Other: None. IMPRESSION: Normal head CT Electronically Signed   By: Alcide CleverMark  Lukens M.D.   On: 11/28/2017 15:07   Mr Brain Wo Contrast  Result Date: 11/29/2017 CLINICAL DATA:  47 year old female with several days of altered mental status. EXAM: MRI HEAD WITHOUT CONTRAST TECHNIQUE: Multiplanar, multiecho pulse sequences of the brain and surrounding structures were obtained without intravenous contrast. COMPARISON:  Head CT 11/28/2017 and earlier. FINDINGS: Brain: Fairly symmetric diffusion and T2/FLAIR signal abnormality in the medial thalami. Increased trace diffusion is seen on series 5, image 72. ADC appears isointense. Increased T2 and FLAIR hyperintensity, which also involves more of  the dorsal thalami than the diffusion abnormality. No associated hemorrhage or mass effect. No other diffusion abnormality. The remaining deep gray matter  nuclei appear normal, the mamillary bodies, brainstem, and gray and white matter signal elsewhere appears normal. No chronic encephalomalacia or chronic cerebral blood products identified. No midline shift, mass effect, evidence of mass lesion, ventriculomegaly, extra-axial collection or acute intracranial hemorrhage. Cervicomedullary junction and pituitary are within normal limits. Vascular: Major intracranial vascular flow voids are preserved. Skull and upper cervical spine: Negative visible cervical spine. Visualized bone marrow signal is within normal limits. Sinuses/Orbits: Negative orbits. Paranasal sinuses and mastoids are stable and well pneumatized. Other: Visible internal auditory structures appear normal. Scalp and face soft tissues appear negative. IMPRESSION: 1. Abnormal signal abnormality in a symmetric pattern limited to the bilateral medial and dorsal thalami. Diffusion is abnormal although not clearly restricted. There is no associated mass effect or hemorrhage. The remainder of the brain appears normal. 2. Differential diagnosis is broad and includes viral encephalitis, subacute bilateral thalamic infarcts (artery of Percheron), metabolic derangement (Wernicke encephalopathy, extra-pontine osmotic demyelination), hypoxic ischemic encephalopathy, toxic exposure (although globus pallidus are spared), sequelae of seizure, and also Creutzfeldt-Jakob Disease (variant CJD, "hockey stick sign"). Recommend Neurology consultation. Electronically Signed   By: Odessa Fleming M.D.   On: 11/29/2017 06:44   Dg Chest Port 1 View  Result Date: 11/28/2017 CLINICAL DATA:  Altered mental status. EXAM: PORTABLE CHEST 1 VIEW COMPARISON:  10/10/2017 FINDINGS: Heart size and pulmonary vascularity are normal. Minimal atelectasis at the lung bases. No effusions or infiltrates. No acute bone abnormality. Previous open reduction and internal fixation of multiple left rib fractures. IMPRESSION: Minimal atelectasis at the lung  bases. Electronically Signed   By: Francene Boyers M.D.   On: 11/28/2017 15:35     Medical Consultants:    None.  Anti-Infectives:   None  Subjective:    Debbie Brooke no new complaints, she denies any symptomatology.  Objective:    Vitals:   11/28/17 2302 11/29/17 0336 11/29/17 0519 11/29/17 0848  BP:   97/69   Pulse:   (!) 101 95  Resp:   20 18  Temp:   98.4 F (36.9 C)   TempSrc:   Oral   SpO2:  96% 97% 98%  Weight: 65.2 kg       Intake/Output Summary (Last 24 hours) at 11/29/2017 1119 Last data filed at 11/29/2017 0928 Gross per 24 hour  Intake 454.33 ml  Output 800 ml  Net -345.67 ml   Filed Weights   11/28/17 2302  Weight: 65.2 kg    Exam: General exam: In no acute distress. Respiratory system: Good air movement and clear to auscultation. Cardiovascular system: S1 & S2 heard, RRR.  Gastrointestinal system: Abdomen is nondistended, soft and nontender.  Central nervous system: Alert and oriented. No focal neurological deficits. Extremities: No pedal edema. Skin: No rashes, lesions or ulcers Psychiatry: Flat affect, not worried about her medical condition   Data Reviewed:    Labs: Basic Metabolic Panel: Recent Labs  Lab 11/22/17 1445 11/23/17 0339 11/28/17 1441 11/29/17 0515  NA  --  142 140 138  K 3.4* 3.5 2.6* 2.5*  CL  --  109 98 102  CO2  --  23 25 24   GLUCOSE  --  109* 119* 89  BUN  --  10 18 15   CREATININE  --  0.43* 0.69 0.81  CALCIUM  --  7.9* 8.8* 8.2*  MG 1.8 2.1 2.0 2.3  GFR Estimated Creatinine Clearance: 82.1 mL/min (by C-G formula based on SCr of 0.81 mg/dL). Liver Function Tests: Recent Labs  Lab 11/28/17 1441  AST 30  ALT 19  ALKPHOS 60  BILITOT 2.2*  PROT 6.6  ALBUMIN 3.2*   No results for input(s): LIPASE, AMYLASE in the last 168 hours. Recent Labs  Lab 11/28/17 1703  AMMONIA 12   Coagulation profile No results for input(s): INR, PROTIME in the last 168 hours.  CBC: Recent Labs  Lab  11/23/17 0339 11/28/17 1441 11/29/17 0515  WBC 5.1 8.2 7.9  HGB 10.3* 13.2 12.1  HCT 32.3* 38.6 37.4  MCV 105.2* 100.3* 101.9*  PLT 227 298 290   Cardiac Enzymes: Recent Labs  Lab 11/28/17 1441  CKTOTAL 249*  TROPONINI <0.03   BNP (last 3 results) No results for input(s): PROBNP in the last 8760 hours. CBG: Recent Labs  Lab 11/28/17 1441  GLUCAP 104*   D-Dimer: No results for input(s): DDIMER in the last 72 hours. Hgb A1c: No results for input(s): HGBA1C in the last 72 hours. Lipid Profile: No results for input(s): CHOL, HDL, LDLCALC, TRIG, CHOLHDL, LDLDIRECT in the last 72 hours. Thyroid function studies: No results for input(s): TSH, T4TOTAL, T3FREE, THYROIDAB in the last 72 hours.  Invalid input(s): FREET3 Anemia work up: No results for input(s): VITAMINB12, FOLATE, FERRITIN, TIBC, IRON, RETICCTPCT in the last 72 hours. Sepsis Labs: Recent Labs  Lab 11/23/17 0339 11/28/17 1441 11/29/17 0515  WBC 5.1 8.2 7.9   Microbiology No results found for this or any previous visit (from the past 240 hour(s)).   Medications:   . enoxaparin (LOVENOX) injection  40 mg Subcutaneous Daily  . ipratropium  0.5 mg Nebulization Q6H  . levalbuterol  1.25 mg Nebulization Q6H  . nicotine  21 mg Transdermal Daily   Continuous Infusions: . famotidine (PEPCID) IV    . levETIRAcetam Stopped (11/29/17 1610)  . potassium chloride 10 mEq (11/29/17 1028)  . potassium chloride        LOS: 0 days   Marinda Elk  [facility]   *Please refer to [email or web address], password [email or web address] to get updated schedule on who will round on this patient, as hospitalists switch teams weekly. If 7PM-7AM, please contact night-coverage at [email or web address], password [email or web address] for any overnight needs.  11/29/2017, 11:19 AM

## 2017-11-29 NOTE — Progress Notes (Signed)
EEG completed; results pending.    

## 2017-11-30 DIAGNOSIS — Z8349 Family history of other endocrine, nutritional and metabolic diseases: Secondary | ICD-10-CM | POA: Diagnosis not present

## 2017-11-30 DIAGNOSIS — F17210 Nicotine dependence, cigarettes, uncomplicated: Secondary | ICD-10-CM | POA: Diagnosis present

## 2017-11-30 DIAGNOSIS — K315 Obstruction of duodenum: Secondary | ICD-10-CM | POA: Diagnosis present

## 2017-11-30 DIAGNOSIS — F410 Panic disorder [episodic paroxysmal anxiety] without agoraphobia: Secondary | ICD-10-CM | POA: Diagnosis present

## 2017-11-30 DIAGNOSIS — R270 Ataxia, unspecified: Secondary | ICD-10-CM | POA: Diagnosis present

## 2017-11-30 DIAGNOSIS — F319 Bipolar disorder, unspecified: Secondary | ICD-10-CM | POA: Diagnosis present

## 2017-11-30 DIAGNOSIS — R569 Unspecified convulsions: Secondary | ICD-10-CM | POA: Diagnosis present

## 2017-11-30 DIAGNOSIS — E512 Wernicke's encephalopathy: Secondary | ICD-10-CM | POA: Diagnosis present

## 2017-11-30 DIAGNOSIS — G629 Polyneuropathy, unspecified: Secondary | ICD-10-CM | POA: Diagnosis present

## 2017-11-30 DIAGNOSIS — I2510 Atherosclerotic heart disease of native coronary artery without angina pectoris: Secondary | ICD-10-CM | POA: Diagnosis present

## 2017-11-30 DIAGNOSIS — R4182 Altered mental status, unspecified: Secondary | ICD-10-CM | POA: Diagnosis present

## 2017-11-30 DIAGNOSIS — Z90710 Acquired absence of both cervix and uterus: Secondary | ICD-10-CM | POA: Diagnosis not present

## 2017-11-30 DIAGNOSIS — G9341 Metabolic encephalopathy: Secondary | ICD-10-CM | POA: Diagnosis present

## 2017-11-30 DIAGNOSIS — I252 Old myocardial infarction: Secondary | ICD-10-CM | POA: Diagnosis not present

## 2017-11-30 DIAGNOSIS — Z83511 Family history of glaucoma: Secondary | ICD-10-CM | POA: Diagnosis not present

## 2017-11-30 DIAGNOSIS — I10 Essential (primary) hypertension: Secondary | ICD-10-CM | POA: Diagnosis present

## 2017-11-30 DIAGNOSIS — B9681 Helicobacter pylori [H. pylori] as the cause of diseases classified elsewhere: Secondary | ICD-10-CM | POA: Diagnosis present

## 2017-11-30 DIAGNOSIS — G8929 Other chronic pain: Secondary | ICD-10-CM | POA: Diagnosis present

## 2017-11-30 DIAGNOSIS — H499 Unspecified paralytic strabismus: Secondary | ICD-10-CM | POA: Diagnosis present

## 2017-11-30 DIAGNOSIS — R531 Weakness: Secondary | ICD-10-CM | POA: Diagnosis present

## 2017-11-30 DIAGNOSIS — R339 Retention of urine, unspecified: Secondary | ICD-10-CM | POA: Diagnosis present

## 2017-11-30 DIAGNOSIS — Z825 Family history of asthma and other chronic lower respiratory diseases: Secondary | ICD-10-CM | POA: Diagnosis not present

## 2017-11-30 DIAGNOSIS — E785 Hyperlipidemia, unspecified: Secondary | ICD-10-CM | POA: Diagnosis present

## 2017-11-30 DIAGNOSIS — J449 Chronic obstructive pulmonary disease, unspecified: Secondary | ICD-10-CM | POA: Diagnosis present

## 2017-11-30 DIAGNOSIS — E876 Hypokalemia: Secondary | ICD-10-CM | POA: Diagnosis present

## 2017-11-30 DIAGNOSIS — K219 Gastro-esophageal reflux disease without esophagitis: Secondary | ICD-10-CM | POA: Diagnosis present

## 2017-11-30 LAB — URINALYSIS, ROUTINE W REFLEX MICROSCOPIC
Bilirubin Urine: NEGATIVE
Glucose, UA: NEGATIVE mg/dL
Hgb urine dipstick: NEGATIVE
Ketones, ur: NEGATIVE mg/dL
Nitrite: NEGATIVE
Protein, ur: NEGATIVE mg/dL
Specific Gravity, Urine: 1.013 (ref 1.005–1.030)
pH: 6 (ref 5.0–8.0)

## 2017-11-30 LAB — BASIC METABOLIC PANEL
Anion gap: 8 (ref 5–15)
BUN: 9 mg/dL (ref 6–20)
CO2: 25 mmol/L (ref 22–32)
Calcium: 8.1 mg/dL — ABNORMAL LOW (ref 8.9–10.3)
Chloride: 106 mmol/L (ref 98–111)
Creatinine, Ser: 0.71 mg/dL (ref 0.44–1.00)
GFR calc Af Amer: >60 mL/min (ref >60)
GFR calc non Af Amer: >60 mL/min (ref >60)
GLUCOSE: 93 mg/dL (ref 70–99)
Potassium: 3.2 mmol/L — ABNORMAL LOW (ref 3.5–5.1)
Sodium: 139 mmol/L (ref 135–145)

## 2017-11-30 LAB — MAGNESIUM: Magnesium: 2 mg/dL (ref 1.7–2.4)

## 2017-11-30 MED ORDER — BISMUTH SUBSALICYLATE 262 MG PO CHEW
524.0000 mg | CHEWABLE_TABLET | Freq: Three times a day (TID) | ORAL | Status: DC
  Administered 2017-11-30 – 2017-12-02 (×7): 524 mg via ORAL
  Filled 2017-11-30 (×9): qty 2

## 2017-11-30 MED ORDER — PANTOPRAZOLE SODIUM 40 MG PO TBEC
40.0000 mg | DELAYED_RELEASE_TABLET | Freq: Two times a day (BID) | ORAL | Status: DC
  Administered 2017-11-30 – 2017-12-02 (×4): 40 mg via ORAL
  Filled 2017-11-30 (×4): qty 1

## 2017-11-30 MED ORDER — METRONIDAZOLE 500 MG PO TABS
500.0000 mg | ORAL_TABLET | Freq: Three times a day (TID) | ORAL | Status: DC
  Administered 2017-11-30 – 2017-12-02 (×6): 500 mg via ORAL
  Filled 2017-11-30 (×8): qty 1

## 2017-11-30 MED ORDER — CLARITHROMYCIN 500 MG PO TABS
500.0000 mg | ORAL_TABLET | Freq: Two times a day (BID) | ORAL | Status: DC
  Administered 2017-11-30 – 2017-12-02 (×5): 500 mg via ORAL
  Filled 2017-11-30 (×5): qty 1

## 2017-11-30 MED ORDER — FLUCONAZOLE 100 MG PO TABS
100.0000 mg | ORAL_TABLET | Freq: Once | ORAL | Status: AC
  Administered 2017-11-30: 100 mg via ORAL
  Filled 2017-11-30: qty 1

## 2017-11-30 MED ORDER — POTASSIUM CHLORIDE CRYS ER 20 MEQ PO TBCR
40.0000 meq | EXTENDED_RELEASE_TABLET | Freq: Once | ORAL | Status: AC
  Administered 2017-11-30: 40 meq via ORAL
  Filled 2017-11-30: qty 2

## 2017-11-30 MED ORDER — ONDANSETRON HCL 4 MG/2ML IJ SOLN
4.0000 mg | Freq: Four times a day (QID) | INTRAMUSCULAR | Status: DC | PRN
  Administered 2017-11-30 – 2017-12-01 (×2): 4 mg via INTRAVENOUS
  Filled 2017-11-30 (×2): qty 2

## 2017-11-30 MED ORDER — ALPRAZOLAM 0.5 MG PO TABS
0.5000 mg | ORAL_TABLET | Freq: Two times a day (BID) | ORAL | Status: DC | PRN
  Administered 2017-11-30 – 2017-12-01 (×2): 0.5 mg via ORAL
  Filled 2017-11-30 (×3): qty 1

## 2017-11-30 MED ORDER — FAMOTIDINE 20 MG PO TABS
20.0000 mg | ORAL_TABLET | Freq: Two times a day (BID) | ORAL | Status: DC
  Administered 2017-11-30 – 2017-12-02 (×4): 20 mg via ORAL
  Filled 2017-11-30 (×4): qty 1

## 2017-11-30 NOTE — Progress Notes (Signed)
Subjective:  Markedly improved  Exam: Vitals:   11/30/17 0901 11/30/17 1437  BP:  (!) 130/98  Pulse: 91 83  Resp: 18 20  Temp:  98.4 F (36.9 C)  SpO2: 92% 96%   Gen: In bed, NAD Resp: non-labored breathing, no acute distress Abd: soft, nt  Neuro: MS: Awake, alert, oriented to place, month, year CN: Pupils equal round reactive, her extraocular movements are markedly improved though she still has some degree of 6th nerve impairment Motor: Her arm strength is significantly improved, though she is able to lift her legs against gravity, she is weak in her lower extremities.  She has 4/5 proximal strength 4+/5 distal strength Sensory: She no longer complains of numbness in her upper extremities, continues to have decreased sensation in her lower extremities  Pertinent Labs: B1 pending Anti-GQ 1B pending CSF-unremarkable  Impression: with persistent nausea and vomiting for multiple weeks who presents with ataxia, ophthalmoplegia, encephalopathy, areflexia.  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  This fits very well with the MRI imaging.  This also supported by the fact that she has had marked improvement with thiamine repletion.  One other consideration with areflexia in the symptoms would be Bickerstaff brainstem encephalitis coupled with Christianne DolinMiller Fisher syndrome which are both frequently anti-GQ1b entities.  The imaging is not typical for this, and I would not expect the improvement without treatment.  Recommendations: 1) continue Depacon 500 mill grams 3 times daily 2) continue thiamine 500 mg 3 times daily, following initial 3 days I would favor using 100 mg oral daily 3) she is likely going to need PT, OT 4) follow-up thiamine, antibodies  Ritta SlotMcNeill Tessla Spurling, MD [facility] [phone]  If 7pm- 7am, please page neurology on call as listed in AMION.

## 2017-11-30 NOTE — Progress Notes (Signed)
RT entered pt room to give breathing treatments and do her NIF. Pt stated she did not want to do any of it at this time. RT will attempt NIF at a later time tonight. RT will continue to monitor.

## 2017-11-30 NOTE — Progress Notes (Signed)
PROGRESS NOTE  ANGEL HOBDY ZOX:[id number] DOB: [birth year] DOA: 11/28/2017 PCP: [name], MD   LOS: 0 days   Brief Narrative / Interim history: 47 y.o. 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 In the ED she started having muscle twitching and there was a concern for complex past partial seizures, neurology at [facility] advised Keppra loading, due to the lack of availability of the EEG she was transferred to [facility].  Subjective: -feeling better this morning, alert, still with some blurry vision.  Sister is at bedside and tells me she is not yet at baseline, feels like her pupils are still dilated but much improved  Assessment & Plan: Principal Problem:   Acute metabolic encephalopathy Active Problems:   Hypokalemia   COPD (chronic obstructive pulmonary disease) (HCC)   Asthma   GERD (gastroesophageal reflux disease)   Panic attacks   HLD (hyperlipidemia)   Tobacco abuse   CAD (coronary artery disease)   Bipolar 1 disorder (HCC)   Depression   Principal Problem Acute metabolic encephalopathy -Of unclear etiology question polypharmacy (however family quite adamant against this), encephalitis, complex seizures or infectious etiology. 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 intake, magnesium normal  Acute urinary retention  -possibly medication induced, place foley and check a UA, also reports darker urine and strong smell along with dysuria  Duodenal stricture in the setting of H. pylori -Status post EGD as an outpatient last week, pathology shows positive H. pylori, discussed with patient and this was not treated in the past, will start triple therapy, will need antibiotics for 14 days  COPD (chronic obstructive pulmonary disease) (HCC) -At baseline continue inhalers.  Bipolar 1 disorder (HCC)  Depression  Scheduled Meds: . enoxaparin (LOVENOX) injection  40 mg Subcutaneous Daily  . fluconazole  100 mg Oral Once  . ipratropium  0.5 mg Nebulization BID  . levalbuterol  1.25 mg Nebulization BID  . nicotine  21 mg Transdermal Daily   Continuous Infusions: . famotidine (PEPCID) IV 20 mg (11/30/17 1111)  . thiamine injection 500 mg (11/30/17 1010)  . valproate sodium 500 mg (11/30/17 0515)   PRN Meds:.acetaminophen **OR** acetaminophen, ALPRAZolam, oxyCODONE  DVT prophylaxis: Lovenox Code Status: Full code  Family Communication: Sister and 3 other family members present at bedside Disposition Plan: Home when ready  Consultants:   Neurology  Procedures:   LP  Antimicrobials:  H pylori therapy 11/29 >>   Objective: Vitals:   11/29/17 1957 11/29/17 2151 11/30/17 0519 11/30/17 0901  BP:  110/73 124/82   Pulse:  92 80 91  Resp:  18 18 18   Temp:  99.1 F (37.3 C) 97.8 F (36.6 C)   TempSrc:  Oral Oral   SpO2: 96% 94% 92% 92%  Weight:        Intake/Output Summary (Last 24 hours) at 11/30/2017 1137 Last data filed at 11/30/2017 0606 Gross per 24 hour  Intake 852.81 ml  Output 1400 ml  Net -547.19 ml   Filed Weights   11/28/17 2302  Weight: 65.2 kg     Examination:  Constitutional: NAD Eyes: no scleral icterus ENMT: Mucous membranes are moist Neck: normal, supple Respiratory: clear to auscultation bilaterally, no wheezing, no crackles. Normal respiratory effort. No accessory muscle use.  Cardiovascular: Regular rate and rhythm, no murmurs / rubs / gallops. No LE edema.  Abdomen: no tenderness. Bowel sounds positive.  Musculoskeletal: no clubbing / cyanosis.  Skin: no rashes Neurologic: No focal findings  Data Reviewed: I have independently reviewed following labs and imaging studies   CBC: Recent Labs  Lab 11/28/17 1441 11/29/17 0515  WBC 8.2 7.9  HGB 13.2 12.1  HCT 38.6 37.4  MCV 100.3* 101.9*  PLT 298 290   Basic Metabolic Panel: Recent Labs  Lab 11/28/17 1441 11/29/17 0515 11/30/17 0823  NA 140 138 139  K 2.6* 2.5* 3.2*  CL 98 102 106  CO2 25 24 25   GLUCOSE 119* 89 93  BUN 18 15 9   CREATININE 0.69 0.81 0.71  CALCIUM 8.8* 8.2* 8.1*  MG 2.0 2.3 2.0   GFR: Estimated Creatinine Clearance: 83.1 mL/min (by C-G formula based on SCr of 0.71 mg/dL). Liver Function Tests: Recent Labs  Lab 11/28/17 1441  AST 30  ALT 19  ALKPHOS 60  BILITOT 2.2*  PROT 6.6  ALBUMIN 3.2*   No results for input(s): LIPASE, AMYLASE in the last 168 hours. Recent Labs  Lab 11/28/17 1703  AMMONIA 12   Coagulation Profile: No results for input(s): INR, PROTIME in the last 168 hours. Cardiac Enzymes: Recent Labs  Lab 11/28/17 1441  CKTOTAL 249*  TROPONINI <0.03   BNP (last 3 results) No results for input(s): PROBNP in the last 8760 hours. HbA1C: No results for input(s): HGBA1C in the last 72 hours. CBG: Recent Labs  Lab 11/28/17 1441  GLUCAP 104*   Lipid Profile: No results for input(s): CHOL, HDL, LDLCALC, TRIG, CHOLHDL, LDLDIRECT in the last 72 hours. Thyroid Function Tests: No results for input(s): TSH, T4TOTAL, FREET4, T3FREE, THYROIDAB in the last 72 hours. Anemia Panel: No results for input(s):  VITAMINB12, FOLATE, FERRITIN, TIBC, IRON, RETICCTPCT in the last 72 hours. Urine analysis:    Component Value Date/Time   COLORURINE AMBER (A) 11/28/2017 1703   APPEARANCEUR CLEAR (A) 11/28/2017 1703   APPEARANCEUR Hazy 08/19/2012 1509   LABSPEC 1.018 11/28/2017 1703   LABSPEC 1.010 08/19/2012 1509   PHURINE 5.0 11/28/2017 1703   GLUCOSEU NEGATIVE 11/28/2017 1703   GLUCOSEU Negative 08/19/2012 1509   HGBUR NEGATIVE 11/28/2017 1703   BILIRUBINUR SMALL (A) 11/28/2017 1703   BILIRUBINUR Negative 08/19/2012 1509   KETONESUR 20 (A) 11/28/2017 1703   PROTEINUR 30 (A) 11/28/2017 1703   NITRITE NEGATIVE 11/28/2017 1703   LEUKOCYTESUR NEGATIVE 11/28/2017 1703   LEUKOCYTESUR Negative 08/19/2012 1509   Sepsis Labs: Invalid input(s): PROCALCITONIN, LACTICIDVEN  Recent Results (from the past 240 hour(s))  CSF culture     Status: None (Preliminary result)   Collection Time: 11/29/17  4:45 PM  Result Value Ref Range Status   Specimen Description CSF  Final   Special Requests NONE  Final   Gram Stain NO WBC SEEN NO ORGANISMS SEEN   Final   Culture   Final    NO GROWTH <  24 HOURS Performed at [facility], [address] [place], [place] [address]    Report Status PENDING  Incomplete      Radiology Studies: Ct Angio Head W Or Wo Contrast  Result Date: 11/29/2017 CLINICAL DATA:  Altered mental status and seizures. Symmetric thalamic signal abnormality on MRI. EXAM: CT ANGIOGRAPHY HEAD AND NECK TECHNIQUE: Multidetector CT imaging of the head and neck was performed using the standard protocol during bolus administration of intravenous contrast. Multiplanar CT image reconstructions and MIPs were obtained to evaluate the vascular anatomy. Carotid stenosis measurements (when applicable) are obtained utilizing NASCET criteria, using the distal internal carotid diameter as the denominator. CONTRAST:  7[facility] California LimitedRockieSurgicare Of Jackson LMaxine [phone] Long CommunityRockieUnion General HospitMaxine [phone]80eElenora FRockiePerry County General HospitMaxine [phone]165eElenora RockieGulfshore Endoscopy IMaxine [phone] ClRockieParkwest Surgery CentMaxine [phone] University Health Blackford HospitMaxine [phone]70RockieTulsa Er & HospNorth RockieEndoscopy Center Of The Central CoaMaxine [phone]31eElenora FeCammie RockieCrown Point Surgery CentMaxine [phone] Alto Va Medical CentMaxine [phone] Porte HosRockieHigh Point Treatment CentMaxine [phone] Surgery Centers LLRockieBroadwater Health CentMaxine [phone] RockieEncompass Health Rehabilitation Hospital Of CharlestMaxine [phone] ([facility] HeRockieWest Bloomfield Surgery Center LLC Dba Lakes Surgery CentMaxine KoreaG825 [phone]11RockieCataract And Laser Center LMaxine [phone] [facility]47eElenRockieMercer County Joint Township Community HospitMaxine [phone] Ambulatory SurRockieUniversity Hospital Of BrooklMaxine [phone]78eERockieLos Angeles Ambulatory Care CentMaxine [phone] ERockieCoffee Regional Medical CentMaxine [phone] Cf70eElenora FeRockieHosp Pediatrico Universitario Dr Antonio OrtMaxine [phone] RockieBaylor Surgicare At North [facility] LLC Dba Baylor Scott And White Surgicare North DallMaxine [phone] RehabilitatiRockiePine Valley Specialty HospitMaxine [phone]53eElenorRockieByrd Regional HospitMaxine [phone]45eElenora FeCammie We81StRockieArcadia Outpatient Surgery Center Maxine [phone] [facility] Medical BranchRockieShea Clinic Dba [facility] Clinic AMaxine [phone]31eElenora FeCammie We46Surgery CeRockieHalcyon Laser And Surgery Center IMaxine [phone]372eEleRockieBaylor Heart And Vascular CentMaxine [phone] AndRockieCamc Memorial HospitMaxine [phone]896eRockieCape Regional Medical CentMaxine [phone] Gate Endoscopy Center LMaxine [phone]76eElenora FeCammie Werner LeanboursAssociates LLC583eElenora FeCammie We15Trident Medical CenRock SpringsPhyll iographic imaging. FINDINGS: CT HEAD FINDINGS Brain: There is no evidence of acute large territory infarct, intracranial hemorrhage, mass effect, or extra-axial fluid collection. There is no definite CT correlate of the bithalamic abnormality on MRI. The ventricles and sulci are normal. Vascular: As below. Skull: No fracture or focal osseous lesion. Sinuses: Visualized paranasal sinuses and mastoid air cells are clear. Orbits: Unremarkable. Review of the MIP images confirms the above findings CTA NECK FINDINGS Aortic arch: Normal variant aortic arch branching pattern with common origin of the brachiocephalic and left common carotid arteries. Widely patent arch vessel origins. Right carotid system: Patent without evidence of dissection or stenosis. Left carotid system: Patent without evidence of dissection or stenosis. Mild atherosclerotic plaque in the carotid bulb. Vertebral arteries: Patent without evidence of dissection or stenosis. Slightly dominant left vertebral artery. Skeleton: No acute osseous abnormality or suspicious osseous lesion. Other neck: No evidence of acute abnormality or mass. Upper chest: Moderately advanced emphysema in the lung apices with dependent subsegmental atelectasis. Review of the MIP images confirms the above findings CTA HEAD FINDINGS Anterior circulation: The internal carotid arteries are widely patent from skull base to carotid termini. ACAs and MCAs are patent without evidence of proximal branch occlusion or significant stenosis. An early bifurcation of the left MCA is incidentally noted, a normal variant. No aneurysm is identified. Posterior circulation: The intracranial vertebral arteries are widely patent to the basilar. Patent PICA and SCA origins are identified bilaterally. The basilar artery is widely patent. Both PCAs are patent without evidence of significant stenosis. There is a fetal origin of the right PCA. No aneurysm is identified. Venous sinuses: As  permitted by contrast timing, patent. Anatomic variants: Fetal right PCA. Delayed phase: No abnormal enhancement. Review of the MIP images confirms the above findings IMPRESSION: 1. No large vessel occlusion, significant stenosis, or aneurysm in the head and neck. 2.  Emphysema (ICD10-J43.9). Electronically Signed   By: Allen  Grady M.D.   On: 11/29/2017 21:59   Ct Head Wo Contrast  Result Date: 11/28/2017 CLINICAL DATA:  Altered mental status EXAM: CT HEAD WITHOUT CONTRAST TECHNIQUE: Contiguous axial images were obtained from the base of the skull through the vertex without intravenous contrast. COMPARISON:  07/18/2015 FINDINGS: Brain: No evidence of acute infarction, hemorrhage, hydrocephalus, extra-axial collection or mass lesion/mass effect. Vascular: No hyperdense vessel or unexpected calcification. Skull: Normal. Negative for fracture or focal lesion. Sinuses/Orbits: No acute finding. Other: None. IMPRESSION: Normal head CT Electronically Signed   By: Mark  Lukens M.D.   On: 11/28/2017 15:07   Ct Angio Neck W Or Wo Contrast  Result Date: 11/29/2017 CLINICAL DATA:  Altered mental status and seizures. Symmetric thalamic signal abnormality on MRI. EXAM: CT ANGIOGRAPHY HEAD AND NECK TECHNIQUE:  Multidetector CT imaging of the head and neck was performed using the standard protocol during bolus administration of intravenous contrast. Multiplanar CT image reconstructions and MIPs were obtained to evaluate the vascular anatomy. Carotid stenosis measurements (when applicable) are obtained utilizing NASCET criteria, using the distal internal carotid diameter as the denominator. CONTRAST:  75mL ISOVUE-370 IOPAMIDOL (ISOVUE-370) INJECTION 76% COMPARISON:  Brain MRI 11/29/2017.  No prior angiographic imaging. FINDINGS: CT HEAD FINDINGS Brain: There is no evidence of acute large territory infarct, intracranial hemorrhage, mass effect, or extra-axial fluid collection. There is no definite CT correlate of the  bithalamic abnormality on MRI. The ventricles and sulci are normal. Vascular: As below. Skull: No fracture or focal osseous lesion. Sinuses: Visualized paranasal sinuses and mastoid air cells are clear. Orbits: Unremarkable. Review of the MIP images confirms the above findings CTA NECK FINDINGS Aortic arch: Normal variant aortic arch branching pattern with common origin of the brachiocephalic and left common carotid arteries. Widely patent arch vessel origins. Right carotid system: Patent without evidence of dissection or stenosis. Left carotid system: Patent without evidence of dissection or stenosis. Mild atherosclerotic plaque in the carotid bulb. Vertebral arteries: Patent without evidence of dissection or stenosis. Slightly dominant left vertebral artery. Skeleton: No acute osseous abnormality or suspicious osseous lesion. Other neck: No evidence of acute abnormality or mass. Upper chest: Moderately advanced emphysema in the lung apices with dependent subsegmental atelectasis. Review of the MIP images confirms the above findings CTA HEAD FINDINGS Anterior circulation: The internal carotid arteries are widely patent from skull base to carotid termini. ACAs and MCAs are patent without evidence of proximal branch occlusion or significant stenosis. An early bifurcation of the left MCA is incidentally noted, a normal variant. No aneurysm is identified. Posterior circulation: The intracranial vertebral arteries are widely patent to the basilar. Patent PICA and SCA origins are identified bilaterally. The basilar artery is widely patent. Both PCAs are patent without evidence of significant stenosis. There is a fetal origin of the right PCA. No aneurysm is identified. Venous sinuses: As permitted by contrast timing, patent. Anatomic variants: Fetal right PCA. Delayed phase: No abnormal enhancement. Review of the MIP images confirms the above findings IMPRESSION: 1. No large vessel occlusion, significant stenosis, or  aneurysm in the head and neck. 2.  Emphysema (ICD10-J43.9). Electronically Signed   By: Sebastian Ache M.D.   On: 11/29/2017 21:59   Mr Brain Wo Contrast  Result Date: 11/29/2017 CLINICAL DATA:  47 year old female with several days of altered mental status. EXAM: MRI HEAD WITHOUT CONTRAST TECHNIQUE: Multiplanar, multiecho pulse sequences of the brain and surrounding structures were obtained without intravenous contrast. COMPARISON:  Head CT 11/28/2017 and earlier. FINDINGS: Brain: Fairly symmetric diffusion and T2/FLAIR signal abnormality in the medial thalami. Increased trace diffusion is seen on series 5, image 72. ADC appears isointense. Increased T2 and FLAIR hyperintensity, which also involves more of the dorsal thalami than the diffusion abnormality. No associated hemorrhage or mass effect. No other diffusion abnormality. The remaining deep gray matter nuclei appear normal, the mamillary bodies, brainstem, and gray and white matter signal elsewhere appears normal. No chronic encephalomalacia or chronic cerebral blood products identified. No midline shift, mass effect, evidence of mass lesion, ventriculomegaly, extra-axial collection or acute intracranial hemorrhage. Cervicomedullary junction and pituitary are within normal limits. Vascular: Major intracranial vascular flow voids are preserved. Skull and upper cervical spine: Negative visible cervical spine. Visualized bone marrow signal is within normal limits. Sinuses/Orbits: Negative orbits. Paranasal sinuses and mastoids are stable and  well pneumatized. Other: Visible internal auditory structures appear normal. Scalp and face soft tissues appear negative. IMPRESSION: 1. Abnormal signal abnormality in a symmetric pattern limited to the bilateral medial and dorsal thalami. Diffusion is abnormal although not clearly restricted. There is no associated mass effect or hemorrhage. The remainder of the brain appears normal. 2. Differential diagnosis is broad and  includes viral encephalitis, subacute bilateral thalamic infarcts (artery of Percheron), metabolic derangement (Wernicke encephalopathy, extra-pontine osmotic demyelination), hypoxic ischemic encephalopathy, toxic exposure (although globus pallidus are spared), sequelae of seizure, and also Creutzfeldt-Jakob Disease (variant CJD, "hockey stick sign"). Recommend Neurology consultation. Electronically Signed   By: Odessa Fleming M.D.   On: 11/29/2017 06:44   Dg Chest Port 1 View  Result Date: 11/28/2017 CLINICAL DATA:  Altered mental status. EXAM: PORTABLE CHEST 1 VIEW COMPARISON:  10/10/2017 FINDINGS: Heart size and pulmonary vascularity are normal. Minimal atelectasis at the lung bases. No effusions or infiltrates. No acute bone abnormality. Previous open reduction and internal fixation of multiple left rib fractures. IMPRESSION: Minimal atelectasis at the lung bases. Electronically Signed   By: Francene Boyers M.D.   On: 11/28/2017 15:35    Time spent: 35 minutes, more than 50% at bedside involved in counseling/discussion on 2 separate visits initially with the patient and with the family   Pamella Pert, MD, PhD [facility] Pager [phone]  If 7PM-7AM, please contact night-coverage [email or web address] Password [email or web address] 11/30/2017, 11:37 AM

## 2017-11-30 NOTE — Progress Notes (Signed)
RT attempted to do NIF on pt a second time and pt refused stating she was not feeling good and did not want to do it. RT will continue to monitor.

## 2017-11-30 NOTE — Progress Notes (Signed)
NIF - 30  Strong effort

## 2017-11-30 NOTE — Progress Notes (Signed)
Patient requesting pain medication. Informed patient that she can only have pain medicine every 8 hours and that she last got it a 1000 so the next dose can be given at 1800. Patient states she doesn't remember getting pain medication. Informed patient that pain medication was given at 1000 because she stated her back pain of 10 out of 10.

## 2017-12-01 DIAGNOSIS — G9341 Metabolic encephalopathy: Principal | ICD-10-CM

## 2017-12-01 LAB — BASIC METABOLIC PANEL
Anion gap: 11 (ref 5–15)
BUN: <5 mg/dL — ABNORMAL LOW (ref 6–20)
CALCIUM: 8.2 mg/dL — AB (ref 8.9–10.3)
CO2: 26 mmol/L (ref 22–32)
Chloride: 104 mmol/L (ref 98–111)
Creatinine, Ser: 0.84 mg/dL (ref 0.44–1.00)
GFR calc Af Amer: >60 mL/min (ref >60)
Glucose, Bld: 83 mg/dL (ref 70–99)
Potassium: 3.7 mmol/L (ref 3.5–5.1)
SODIUM: 141 mmol/L (ref 135–145)

## 2017-12-01 LAB — HSV DNA BY PCR (REFERENCE LAB)
HSV 1 DNA: NEGATIVE
HSV 2 DNA: NEGATIVE

## 2017-12-01 LAB — GLUCOSE, CAPILLARY: Glucose-Capillary: 124 mg/dL — ABNORMAL HIGH (ref 70–99)

## 2017-12-01 LAB — TSH: TSH: 5.747 u[IU]/mL — ABNORMAL HIGH (ref 0.350–4.500)

## 2017-12-01 MED ORDER — TAMSULOSIN HCL 0.4 MG PO CAPS
0.4000 mg | ORAL_CAPSULE | Freq: Every day | ORAL | Status: DC
  Administered 2017-12-01 – 2017-12-02 (×2): 0.4 mg via ORAL
  Filled 2017-12-01 (×2): qty 1

## 2017-12-01 MED ORDER — LORAZEPAM 2 MG/ML IJ SOLN
0.5000 mg | Freq: Once | INTRAMUSCULAR | Status: AC
  Administered 2017-12-01: 0.5 mg via INTRAVENOUS
  Filled 2017-12-01: qty 1

## 2017-12-01 MED ORDER — DIVALPROEX SODIUM 250 MG PO DR TAB
750.0000 mg | DELAYED_RELEASE_TABLET | Freq: Two times a day (BID) | ORAL | Status: DC
  Administered 2017-12-01 – 2017-12-02 (×2): 750 mg via ORAL
  Filled 2017-12-01 (×2): qty 3

## 2017-12-01 NOTE — Progress Notes (Signed)
PROGRESS NOTE  Debbie BrookeCarrie L Ring ZOX:[id number] DOB: March 03, 1970 DOA: 11/28/2017 PCP: Nicolasa DuckingGarlick, William, MD   LOS: 1 day   Brief Narrative / Interim history: 47 y.o. 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 In the ED she started having muscle twitching and there was a concern for complex past partial seizures, [facility] at [facility] advised Keppra loading, due to the lack of availability of the EEG she was transferred to [facility].  Subjective:    Patient in bed, appears comfortable, denies any headache, no fever, no chest pain or pressure, no shortness of breath , no abdominal pain. No focal weakness.   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  MRI of the brain showing nonspecific changes with diffuse differential.  Seen by neurology, placed on IV thiamine with good effect.  B1 levels and anti-GQ 1B antibody levels are pending.  Clinically improved, commence PT OT.  Likely discharge in the next 1 to 2 days.  CSF was unremarkable.  Hypokalemia  - replaced and stable. Check TSH .   Acute urinary retention - possibly medication induced, Foley catheter, placed on Flomax.  UA stable.  Duodenal stricture in the setting of H. Pylori  -  Status post EGD as an outpatient last week, pathology shows positive H. pylori, discussed with patient and this was not treated in the past, will start triple therapy, will need antibiotics for 14 days  COPD (chronic obstructive  pulmonary disease) (HCC)  -At baseline continue inhalers.  Bipolar 1 disorder (HCC)  Depression  Scheduled Meds: . bismuth subsalicylate  524 mg Oral TID AC & HS  . clarithromycin  500 mg Oral Q12H  . enoxaparin (LOVENOX) injection  40 mg Subcutaneous Daily  . famotidine  20 mg Oral BID  . ipratropium  0.5 mg Nebulization BID  . levalbuterol  1.25 mg Nebulization BID  . metroNIDAZOLE  500 mg Oral Q8H  . nicotine  21 mg Transdermal Daily  . pantoprazole  40 mg Oral BID AC   Continuous Infusions: . thiamine injection 500 mg (11/30/17 2205)  . valproate sodium 500 mg (12/01/17 0639)   PRN Meds:.acetaminophen **OR** acetaminophen, ALPRAZolam, ondansetron (ZOFRAN) IV, oxyCODONE   DVT prophylaxis: Lovenox Code Status: Full code  Family Communication: Sister and 3 other family members present at bedside Disposition Plan: Home when ready  Consultants:   Neurology  Procedures:   LP  Antimicrobials:  H pylori therapy 11/29 >>   Objective: Vitals:   11/30/17 0901 11/30/17 1437 11/30/17 2119 12/01/17 0703  BP:  (!) 130/98 119/84 114/88  Pulse: 91 83 79 80  Resp: 18 20 18 16   Temp:  98.4 F (36.9 C) 98.1 F (36.7 C) 98.3 F (36.8 C)  TempSrc:  Oral    SpO2: 92% 96% 94% 99%  Weight:        Intake/Output Summary (Last 24 hours) at 12/01/2017 1053 Last data filed at 12/01/2017 0357 Gross per 24 hour  Intake 335.6 ml  Output 1650 ml  Net -1314.4 ml   Filed Weights   11/28/17  2302  Weight: 65.2 kg    Examination:  Awake Alert, Oriented X 3, No new F.N deficits, Normal affect [place].AT,PERRAL, mild R lateral gaze diplopia/blurry vision Supple Neck,No JVD, No cervical lymphadenopathy appriciated.  Symmetrical Chest wall movement, Good air movement bilaterally, CTAB RRR,No Gallops, Rubs or new Murmurs, No Parasternal Heave +ve B.Sounds, Abd Soft, No tenderness, No organomegaly appriciated, No rebound - guarding or rigidity. No Cyanosis, Clubbing or edema, No new  Rash or bruise   Data Reviewed: I have independently reviewed following labs and imaging studies   CBC: Recent Labs  Lab 11/28/17 1441 11/29/17 0515  WBC 8.2 7.9  HGB 13.2 12.1  HCT 38.6 37.4  MCV 100.3* 101.9*  PLT 298 290   Basic Metabolic Panel: Recent Labs  Lab 11/28/17 1441 11/29/17 0515 11/30/17 0823 12/01/17 0416  NA 140 138 139 141  K 2.6* 2.5* 3.2* 3.7  CL 98 102 106 104  CO2 25 24 25 26   GLUCOSE 119* 89 93 83  BUN 18 15 9  <5*  CREATININE 0.69 0.81 0.71 0.84  CALCIUM 8.8* 8.2* 8.1* 8.2*  MG 2.0 2.3 2.0  --    GFR: Estimated Creatinine Clearance: 79.2 mL/min (by C-G formula based on SCr of 0.84 mg/dL). Liver Function Tests: Recent Labs  Lab 11/28/17 1441  AST 30  ALT 19  ALKPHOS 60  BILITOT 2.2*  PROT 6.6  ALBUMIN 3.2*   No results for input(s): LIPASE, AMYLASE in the last 168 hours. Recent Labs  Lab 11/28/17 1703  AMMONIA 12   Coagulation Profile: No results for input(s): INR, PROTIME in the last 168 hours. Cardiac Enzymes: Recent Labs  Lab 11/28/17 1441  CKTOTAL 249*  TROPONINI <0.03   BNP (last 3 results) No results for input(s): PROBNP in the last 8760 hours. HbA1C: No results for input(s): HGBA1C in the last 72 hours. CBG: Recent Labs  Lab 11/28/17 1441  GLUCAP 104*   Lipid Profile: No results for input(s): CHOL, HDL, LDLCALC, TRIG, CHOLHDL, LDLDIRECT in the last 72 hours. Thyroid Function Tests: No results for input(s): TSH, T4TOTAL, FREET4, T3FREE, THYROIDAB in the last 72 hours. Anemia Panel: No results for input(s): VITAMINB12, FOLATE, FERRITIN, TIBC, IRON, RETICCTPCT in the last 72 hours. Urine analysis:    Component Value Date/Time   COLORURINE YELLOW 11/30/2017 1204   APPEARANCEUR CLEAR 11/30/2017 1204   APPEARANCEUR Hazy 08/19/2012 1509   LABSPEC 1.013 11/30/2017 1204   LABSPEC 1.010 08/19/2012 1509   PHURINE 6.0 11/30/2017 1204   GLUCOSEU NEGATIVE 11/30/2017 1204   GLUCOSEU Negative 08/19/2012 1509   HGBUR  NEGATIVE 11/30/2017 1204   BILIRUBINUR NEGATIVE 11/30/2017 1204   BILIRUBINUR Negative 08/19/2012 1509   KETONESUR NEGATIVE 11/30/2017 1204   PROTEINUR NEGATIVE 11/30/2017 1204   NITRITE NEGATIVE 11/30/2017 1204   LEUKOCYTESUR TRACE (A) 11/30/2017 1204   LEUKOCYTESUR Negative 08/19/2012 1509   Sepsis Labs: Invalid input(s): PROCALCITONIN, LACTICIDVEN  Recent Results (from the past 240 hour(s))  CSF culture     Status: None (Preliminary result)   Collection Time: 11/29/17  4:45 PM  Result Value Ref Range Status   Specimen Description CSF  Final   Special Requests NONE  Final   Gram Stain NO WBC SEEN NO ORGANISMS SEEN   Final   Culture   Final    NO GROWTH < 24 HOURS Performed at [facility], [address] [place], [place] [address]    Report Status PENDING  Incomplete      Radiology Studies: Ct Angio  Head W Or Wo Contrast  Result Date: 11/29/2017 CLINICAL DATA:  Altered mental status and seizures. Symmetric thalamic signal abnormality on MRI. EXAM: CT ANGIOGRAPHY HEAD AND NECK TECHNIQUE: Multidetector CT imaging of the head and neck was performed using the standard protocol during bolus administration of intravenous contrast. Multiplanar CT image reconstructions and MIPs were obtained to evaluate the vascular anatomy. Carotid stenosis measurements (when applicable) are obtained utilizing NASCET criteria, using the distal internal carotid diameter as the denominator. CONTRAST:  75mL ISOVUE-370 IOPAMIDOL (ISOVUE-370) INJECTION 76% COMPARISON:  Brain MRI 11/29/2017.  No prior angiographic imaging. FINDINGS: CT HEAD FINDINGS Brain: There is no evidence of acute large territory infarct, intracranial hemorrhage, mass effect, or extra-axial fluid collection. There is no definite CT correlate of the bithalamic abnormality on MRI. The ventricles and sulci are normal. Vascular: As below. Skull: No fracture or focal osseous lesion. Sinuses: Visualized paranasal sinuses and mastoid air  cells are clear. Orbits: Unremarkable. Review of the MIP images confirms the above findings CTA NECK FINDINGS Aortic arch: Normal variant aortic arch branching pattern with common origin of the brachiocephalic and left common carotid arteries. Widely patent arch vessel origins. Right carotid system: Patent without evidence of dissection or stenosis. Left carotid system: Patent without evidence of dissection or stenosis. Mild atherosclerotic plaque in the carotid bulb. Vertebral arteries: Patent without evidence of dissection or stenosis. Slightly dominant left vertebral artery. Skeleton: No acute osseous abnormality or suspicious osseous lesion. Other neck: No evidence of acute abnormality or mass. Upper chest: Moderately advanced emphysema in the lung apices with dependent subsegmental atelectasis. Review of the MIP images confirms the above findings CTA HEAD FINDINGS Anterior circulation: The internal carotid arteries are widely patent from skull base to carotid termini. ACAs and MCAs are patent without evidence of proximal branch occlusion or significant stenosis. An early bifurcation of the left MCA is incidentally noted, a normal variant. No aneurysm is identified. Posterior circulation: The intracranial vertebral arteries are widely patent to the basilar. Patent PICA and SCA origins are identified bilaterally. The basilar artery is widely patent. Both PCAs are patent without evidence of significant stenosis. There is a fetal origin of the right PCA. No aneurysm is identified. Venous sinuses: As permitted by contrast timing, patent. Anatomic variants: Fetal right PCA. Delayed phase: No abnormal enhancement. Review of the MIP images confirms the above findings IMPRESSION: 1. No large vessel occlusion, significant stenosis, or aneurysm in the head and neck. 2.  Emphysema (ICD10-J43.9). Electronically Signed   By: Sebastian Ache M.D.   On: 11/29/2017 21:59   Ct Angio Neck W Or Wo Contrast  Result Date:  11/29/2017 CLINICAL DATA:  Altered mental status and seizures. Symmetric thalamic signal abnormality on MRI. EXAM: CT ANGIOGRAPHY HEAD AND NECK TECHNIQUE: Multidetector CT imaging of the head and neck was performed using the standard protocol during bolus administration of intravenous contrast. Multiplanar CT image reconstructions and MIPs were obtained to evaluate the vascular anatomy. Carotid stenosis measurements (when applicable) are obtained utilizing NASCET criteria, using the distal internal carotid diameter as the denominator. CONTRAST:  75mL ISOVUE-370 IOPAMIDOL (ISOVUE-370) INJECTION 76% COMPARISON:  Brain MRI 11/29/2017.  No prior angiographic imaging. FINDINGS: CT HEAD FINDINGS Brain: There is no evidence of acute large territory infarct, intracranial hemorrhage, mass effect, or extra-axial fluid collection. There is no definite CT correlate of the bithalamic abnormality on MRI. The ventricles and sulci are normal. Vascular: As below. Skull: No fracture or focal osseous lesion. Sinuses: Visualized paranasal sinuses and mastoid air cells  are clear. Orbits: Unremarkable. Review of the MIP images confirms the above findings CTA NECK FINDINGS Aortic arch: Normal variant aortic arch branching pattern with common origin of the brachiocephalic and left common carotid arteries. Widely patent arch vessel origins. Right carotid system: Patent without evidence of dissection or stenosis. Left carotid system: Patent without evidence of dissection or stenosis. Mild atherosclerotic plaque in the carotid bulb. Vertebral arteries: Patent without evidence of dissection or stenosis. Slightly dominant left vertebral artery. Skeleton: No acute osseous abnormality or suspicious osseous lesion. Other neck: No evidence of acute abnormality or mass. Upper chest: Moderately advanced emphysema in the lung apices with dependent subsegmental atelectasis. Review of the MIP images confirms the above findings CTA HEAD FINDINGS  Anterior circulation: The internal carotid arteries are widely patent from skull base to carotid termini. ACAs and MCAs are patent without evidence of proximal branch occlusion or significant stenosis. An early bifurcation of the left MCA is incidentally noted, a normal variant. No aneurysm is identified. Posterior circulation: The intracranial vertebral arteries are widely patent to the basilar. Patent PICA and SCA origins are identified bilaterally. The basilar artery is widely patent. Both PCAs are patent without evidence of significant stenosis. There is a fetal origin of the right PCA. No aneurysm is identified. Venous sinuses: As permitted by contrast timing, patent. Anatomic variants: Fetal right PCA. Delayed phase: No abnormal enhancement. Review of the MIP images confirms the above findings IMPRESSION: 1. No large vessel occlusion, significant stenosis, or aneurysm in the head and neck. 2.  Emphysema (ICD10-J43.9). Electronically Signed   By: Sebastian Ache M.D.   On: 11/29/2017 21:59    Time spent: 35 minutes, more than 50% at bedside involved in counseling/discussion on 2 separate visits initially with the patient and with the family  Signature  Susa Raring M.D on 12/01/2017 at 10:53 AM   -  To page go to [email or web address] - password [facility]

## 2017-12-01 NOTE — Progress Notes (Addendum)
Patient having complaints of feeling lightheaded. Neuro checks completed with no change in status just continues to stare but will respond when called. Answers all questions correctly and responds to all commands. States "I just don't feel right" Wanting to call family members. Will notify Dr. Thedore MinsSingh. Will continue to follow as needed.

## 2017-12-01 NOTE — Progress Notes (Addendum)
Subjective:  Patient asleep in bed, NAD. Easy to  arouse. States that her vision has improved, but still needs work.   Exam: Vitals:   11/30/17 2119 12/01/17 0703  BP: 119/84 114/88  Pulse: 79 80  Resp: 18 16  Temp: 98.1 F (36.7 C) 98.3 F (36.8 C)  SpO2: 94% 99%   Gen: In bed, NAD Resp: non-labored breathing, no acute distress Abd: soft, nt Musculoskeletal: left arm swollen.   Neurological Examination Mental Status: Alert, oriented place, month. Year, ( reverses age to [age]) thought content appropriate.  Speech fluent without evidence of aphasia.  Able to follow commands without difficulty. Cranial Nerves: II: Visual fields grossly normal,  III,IV, VI: ptosis not present, extra-ocular motions intact bilaterally, pupils equal, round, reactive to light and accommodation V,VII: smile symmetric, facial light touch sensation normal bilaterally VIII: hearing normal bilaterally IX,X: uvula rises symmetrically XI: bilateral shoulder shrug XII: midline tongue extension Motor: Right : Upper extremity   4/5  Left:     Upper extremity   4/5  Lower extremity   4/5  Lower extremity   4/5 Able to lift legs antigravity Tone and bulk:normal tone throughout; no atrophy noted Sensory: light touch decreased BLE,  Deep Tendon Reflexes: 2+ and symmetric biceps and patella Cerebellar: nomal FNF and HTS still with some ataxia Gait: deferred    Pertinent Labs: B1 pending Anti-GQ 1B pending CSF-unremarkable UA: + UTI   Valentina LucksJessica Williams, MSN, NP-C [facility] [phone]  I have seen the patient reviewed the above note.  Her ophthalmoplegia continues to improve, she remains slightly slow to answer questions, though I am not certain if this is possibly related to depression versus encephalopathy.  Impression: 47 yo F with persistent nausea and vomiting for multiple weeks who presents with ataxia, ophthalmoplegia, encephalopathy, areflexia.  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  This fits very well with the MRI imaging.  This also supported by the fact that she has had marked improvement with thiamine repletion.  One other consideration with areflexia in the symptoms would be Bicker[place] brainstem encephalitis coupled with Christianne DolinMiller Fisher syndrome which are both frequently anti-GQ1b entities.  The imaging is not typical for this, and I would not expect the improvement without treatment.  Recommendations: 1) change Depakote to 750 mg twice daily 2) continue thiamine 500 mg 3 times daily, following initial 3 days I would favor using 100 mg oral daily 3) she is likely going to need PT, OT 4) follow-up thiamine, antibodies  Ritta SlotMcNeill Aodhan Scheidt, MD [facility] [phone]  If 7pm- 7am, please page neurology on call as listed in AMION.

## 2017-12-01 NOTE — Progress Notes (Signed)
1145 pt. Indicated not feeling well and felt lightheaded. Completed neuro checks and assessed VS. BP elevated at 151/95. Physician Thedore MinsSingh otified. Physician directed to complete orthostatic VS and inform of results. Ortho VS completed. Physician indicated to continue to monitor. Pt. Indicated feeling better after IV infusion of Thiamine. Will continue to monitor.   Informed physician Amada JupiterKirkpatrick of patient's complaint of not feeling well and lightheadedness.   Informed Singh of slight left mouth drooping when patient smiling. Slight droop present throughout shift.

## 2017-12-01 NOTE — Progress Notes (Signed)
NIF= -20, -25, -24 cmH2O with three attempts with decent effort.

## 2017-12-02 LAB — VALPROIC ACID LEVEL: VALPROIC ACID LVL: 126 ug/mL — AB (ref 50.0–100.0)

## 2017-12-02 MED ORDER — ALPRAZOLAM 2 MG PO TABS: 1.0000 mg | ORAL_TABLET | Freq: Every day | ORAL | 0 refills | Status: DC

## 2017-12-02 MED ORDER — VITAMIN B-1 100 MG PO TABS: 100.0000 mg | ORAL_TABLET | Freq: Every day | ORAL | 0 refills | Status: AC

## 2017-12-02 MED ORDER — TAMSULOSIN HCL 0.4 MG PO CAPS: 0.4000 mg | ORAL_CAPSULE | Freq: Every day | ORAL | 0 refills | Status: DC

## 2017-12-02 MED ORDER — METRONIDAZOLE 500 MG PO TABS: 500.0000 mg | ORAL_TABLET | Freq: Three times a day (TID) | ORAL | 0 refills | Status: DC

## 2017-12-02 MED ORDER — BISMUTH SUBSALICYLATE 262 MG PO CHEW: 524.0000 mg | CHEWABLE_TABLET | Freq: Three times a day (TID) | ORAL | 0 refills | Status: AC

## 2017-12-02 MED ORDER — HYDROCODONE-ACETAMINOPHEN 7.5-325 MG/15ML PO SOLN
ORAL | Status: AC
  Filled 2017-12-02: qty 15

## 2017-12-02 MED ORDER — DIVALPROEX SODIUM 250 MG PO DR TAB: 750.0000 mg | DELAYED_RELEASE_TABLET | Freq: Two times a day (BID) | ORAL | 0 refills | Status: DC

## 2017-12-02 MED ORDER — CLARITHROMYCIN 500 MG PO TABS: 500.0000 mg | ORAL_TABLET | Freq: Two times a day (BID) | ORAL | 0 refills | Status: DC

## 2017-12-02 NOTE — Care Management Note (Signed)
Case Management Note  Patient Details  Name: Debbie Gay MRN: [id number] Date of Birth: 1970/11/11  Subjective/Objective:                    Action/Plan:  Spoke w patient at bedside. She states that her [facility] and she would like to use the same. Referral placed, RW will be delivered to room prior to DC.   Expected Discharge Date:  12/02/17               Expected Discharge Plan:  Home w Home Health Services  In-House Referral:     Discharge planning Services  CM Consult  Post Acute Care Choice:  Home Health, Durable Medical Equipment Choice offered to:  Patient  DME Arranged:  Walker rolling DME Agency:  [facility]  HH Arranged:  RN, PT, OT [facility] Agency[facility]  [facility]  Status of Service:  Completed, signed off  If discussed at Long Length of Stay Meetings, dates discussed:    Additional Comments:  Lawerance SabalDebbie Tarun Patchell, RN 12/02/2017, 9:07 AM

## 2017-12-02 NOTE — Evaluation (Signed)
Physical Therapy Evaluation Patient Details Name: Debbie Gay MRN: [id number] DOB: 1970-08-18 Today's Date: 12/02/2017   History of Present Illness  47 y.o. 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 All education has been completed and the patient has no further questions. PTA, pt living with family members independent with all mobility. [place], pt not at baseline, ambulating extremely slow but steady utilizing RW. Father present and states there will be numerous family members home 24/7 to guard her. Cognition slow processing but appropriate, agreeable to HHPT and use of RW while she works with ongoing therapy.  See below for any follow-up Physical Therapy or equipment needs. PT is signing off. Thank you for this referral.     Follow Up Recommendations Home health PT    Equipment Recommendations  Rolling walker with 5" wheels    Recommendations for Other Services       Precautions / Restrictions Precautions Precautions: Fall Restrictions Weight Bearing Restrictions: No      Mobility  Bed Mobility Overal bed mobility: Needs Assistance                Transfers Overall transfer level: Modified independent                  Ambulation/Gait Ambulation/Gait assistance: Min guard Gait Distance (Feet): 40 Feet Assistive device: Rolling walker (2 wheeled) Gait Pattern/deviations: Step-to  pattern Gait velocity: decreased   General Gait Details: extremly slow but steady gait, different than baselinse per father. discussed importance of guarding her back at home today he reports that family wil be present to do so.   Stairs            Wheelchair Mobility    Modified Rankin (Stroke Patients Only)       Balance Overall balance assessment: No apparent balance deficits (not formally assessed)                                           Pertinent Vitals/Pain Pain Assessment: No/denies pain    Home Living Family/patient expects to be discharged to:: Private residence Living Arrangements: Other relatives Available Help at Discharge: Family Type of Home: House Home Access: Level entry     Home Layout: One level Home Equipment: None      Prior Function Level of Independence: Independent         Comments: takes care of her grandmother at home. lives with several family members     Hand Dominance        Extremity/Trunk Assessment   Upper Extremity Assessment Upper Extremity Assessment: Overall WFL for tasks assessed    Lower Extremity Assessment Lower Extremity Assessment: Overall WFL for tasks assessed       Communication   Communication: No difficulties  Cognition Arousal/Alertness: Awake/alert Behavior During Therapy: Flat affect Overall Cognitive Status: Impaired/Different from baseline Area of Impairment: Following commands  General Comments: slow processing       General Comments      Exercises     Assessment/Plan    PT Assessment All further PT needs can be met in the next venue of care  PT Problem List Decreased strength;Decreased activity tolerance;Decreased balance;Decreased mobility       PT Treatment Interventions      PT Goals (Current goals can be found in the Care Plan section)  Acute Rehab PT Goals Patient Stated Goal: return home with family and cont  HHPT PT Goal Formulation: With patient Time For Goal Achievement: 12/16/17 Potential to Achieve Goals: Good    Frequency     Barriers to discharge        Co-evaluation               AM-PAC PT "6 Clicks" Mobility  Outcome Measure Help needed turning from your back to your side while in a flat bed without using bedrails?: A Little Help needed moving from lying on your back to sitting on the side of a flat bed without using bedrails?: A Little Help needed moving to and from a bed to a chair (including a wheelchair)?: A Little Help needed standing up from a chair using your arms (e.g., wheelchair or bedside chair)?: A Little Help needed to walk in hospital room?: A Little Help needed climbing 3-5 steps with a railing? : A Little 6 Click Score: 18    End of Session Equipment Utilized During Treatment: Gait belt Activity Tolerance: Patient tolerated treatment well Patient left: in bed Nurse Communication: Mobility status PT Visit Diagnosis: Unsteadiness on feet (R26.81)    Time: 5573-2202 PT Time Calculation (min) (ACUTE ONLY): 34 min   Charges:   PT Evaluation $PT Eval Low Complexity: 1 Low PT Treatments $Gait Training: 8-22 mins       Reinaldo Berber, PT, DPT Acute Rehabilitation Services Pager: [phone] Office: [place] 12/02/2017, 3:01 PM

## 2017-12-02 NOTE — Progress Notes (Signed)
NIF not performed. Patient being discharged.

## 2017-12-02 NOTE — Discharge Summary (Signed)
Debbie Gay:[id number] DOB: 1970-01-27 DOA: 11/28/2017  PCP: [name], MD  Admit date: 11/28/2017  Discharge date: 12/02/2017  Admitted From: Home   Disposition:  Home  Recommendations for Outpatient Follow-up:   Follow up with PCP in 1-2 weeks  PCP Please obtain BMP/CBC, 2 view CXR in 1week,  (see Discharge instructions)   PCP Please follow up on the following pending results: Follow pending CSF index, oligoclonal bands, thiamine levels, anti-GQ 1B antibody level.   Home Health: PT-RN Equipment/Devices: Rolling walker Consultations: Neurology Discharge Condition: Fair CODE STATUS: Full code Diet Recommendation: Heart Healthy    Chief complaint.  Weakness.  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  MRI of the brain showing nonspecific changes with  diffuse differential.  Seen by neurology, placed on IV thiamine with good effect.  B1 levels and anti-GQ 1B antibody levels are pending.    CSF was unremarkable, MRI was nonspecific, discussed with neurologist Dr. Amada Jupiter who is convinced that since patient responded remarkably well to IV thiamine this was Wernicke's encephalopathy caused by thiamine deficiency due to poor oral intake.  She will be placed on thiamine, continue Depakote for at least 1 month with outpatient neurology follow-up.  Clinically suspicion for seizures is low.  He is still quite weak but adamant to be discharged home, PT eval is pending, if she does fairly well she will be discharged home with home PT and walker.  Hypokalemia  - replaced and stable.   Acute urinary retention - possibly medication induced,  UA stable, placed on Foley and Flomax, now wants Foley removed and wants to be discharged home, Foley will be removed, will be discharged home if she is able to work well with PT..  Duodenal stricture in the setting of H. Pylori  -  Status post EGD as an outpatient last week, pathology  shows positive H. pylori, discussed with patient and this was not treated in the past, will start triple therapy, will need antibiotics for 14 days which will be provided with outpatient GI follow-up.  COPD (chronic obstructive pulmonary disease) (HCC)  -At baseline continue inhalers.  Bipolar 1 disorder (HCC) chronic pain.  Continue home medications counseled to reduce narcotics and benzodiazepines.     Discharge diagnosis     Principal Problem:   Acute metabolic encephalopathy Active Problems:   Hypokalemia   COPD (chronic obstructive pulmonary disease) (HCC)   Asthma   GERD (gastroesophageal reflux disease)   Panic attacks   HLD (hyperlipidemia)   Tobacco abuse   CAD (coronary artery disease)   Bipolar 1 disorder (HCC)   Depression   Altered mental status, unspecified    Discharge instructions    Discharge  Instructions    Diet - low sodium heart healthy   Complete by:  As directed    Discharge instructions   Complete by:  As directed    Follow with Primary MD [name], MD in 7 days   Get CBC, CMP, 2 view Chest X ray -  checked  by Primary MD  in 5-7 days   Activity: As tolerated with Full fall precautions use walker/cane & assistance as needed  Disposition Home    Diet: Heart Healthy    Special Instructions: If you have smoked or chewed Tobacco  in the last 2 yrs please stop smoking, stop any regular Alcohol  and or any Recreational drug use.  On your next visit with your primary care physician please Get Medicines reviewed and adjusted.  Please request your Prim.MD to go over all Hospital Tests and Procedure/Radiological results at the follow up, please get all Hospital records sent to your Prim MD by signing hospital release before you go home.  If you experience worsening of your admission symptoms, develop shortness of breath, life threatening emergency, suicidal or homicidal thoughts you must seek medical attention immediately by calling 911 or calling your MD immediately  if symptoms less severe.  You Must read complete instructions/literature along with all the possible adverse reactions/side effects for all the Medicines you take and that have been prescribed to you. Take any new Medicines after you have completely understood and accpet all the possible adverse reactions/side effects.   Do not drive, operate heavy machinery, perform activities at heights, swimming or participation in water activities or provide baby sitting services until you have seen by Primary MD or a Neurologist and advised to do so again.   Increase activity slowly   Complete by:  As directed       Discharge Medications   Allergies as of 12/02/2017      Reactions   Levaquin [levofloxacin] Swelling   Prednisone Shortness Of Breath, Swelling   Penicillins    Has patient had a PCN reaction causing  immediate rash, facial/tongue/throat swelling, SOB or lightheadedness with hypotension: Unknown Has patient had a PCN reaction causing severe rash involving mucus membranes or skin necrosis: Unknown Has patient had a PCN reaction that required hospitalization: Unknown Has patient had a PCN reaction occurring within the last 10 years: Unknown If all of the above answers are "NO", then may proceed with Cephalosporin use.      Medication List    STOP taking these medications   diphenhydramine-acetaminophen 25-500 MG Tabs tablet Commonly known as:  TYLENOL PM   promethazine 25 MG tablet Commonly known as:  PHENERGAN  TAKE these medications   acetaminophen 325 MG tablet Commonly known as:  TYLENOL Take 650 mg by mouth daily as needed for mild pain or moderate pain.   albuterol 108 (90 Base) MCG/ACT inhaler Commonly known as:  PROVENTIL HFA;VENTOLIN HFA Inhale 2 puffs into the lungs every 6 (six) hours as needed for wheezing or shortness of breath.   alprazolam 2 MG tablet Commonly known as:  XANAX Take 0.5 tablets (1 mg total) by mouth at bedtime. What changed:    how much to take  when to take this   atorvastatin 20 MG tablet Commonly known as:  LIPITOR Take 20 mg by mouth daily.   bismuth subsalicylate 262 MG chewable tablet Commonly known as:  PEPTO BISMOL Chew 2 tablets (524 mg total) by mouth 4 (four) times daily -  before meals and at bedtime for 12 days.   clarithromycin 500 MG tablet Commonly known as:  BIAXIN Take 1 tablet (500 mg total) by mouth every 12 (twelve) hours.   divalproex 250 MG DR tablet Commonly known as:  DEPAKOTE Take 3 tablets (750 mg total) by mouth every 12 (twelve) hours.   FLUoxetine 40 MG capsule Commonly known as:  PROZAC Take 40 mg by mouth daily.   KLOR-CON M10 10 MEQ tablet Generic drug:  potassium chloride Take 20 mEq by mouth 2 (two) times daily.   metoCLOPramide 10 MG tablet Commonly known as:  REGLAN Take 10 mg by mouth  every 6 (six) hours as needed for nausea.   metroNIDAZOLE 500 MG tablet Commonly known as:  FLAGYL Take 1 tablet (500 mg total) by mouth every 8 (eight) hours.   nitroGLYCERIN 0.4 MG/SPRAY spray Commonly known as:  NITROLINGUAL Place 1 spray under the tongue every 5 (five) minutes as needed for chest pain.   Oxycodone HCl 10 MG Tabs Take 20 mg by mouth 3 (three) times daily.   pantoprazole 40 MG tablet Commonly known as:  PROTONIX Take 1 tablet (40 mg total) by mouth daily.   PREMARIN 1.25 MG tablet Generic drug:  estrogens (conjugated) Take 1.25 mg by mouth daily.   QUEtiapine 100 MG tablet Commonly known as:  SEROQUEL Take 100-200 mg by mouth at bedtime.   sucralfate 1 g tablet Commonly known as:  CARAFATE Take 1 g by mouth 4 (four) times daily.   tamsulosin 0.4 MG Caps capsule Commonly known as:  FLOMAX Take 1 capsule (0.4 mg total) by mouth daily.   thiamine 100 MG tablet Commonly known as:  VITAMIN B-1 Take 1 tablet (100 mg total) by mouth daily.   tiotropium 18 MCG inhalation capsule Commonly known as:  SPIRIVA Place 18 mcg into inhaler and inhale daily as needed (for wheezing/shortness of breath).            Durable Medical Equipment  (From admission, onward)         Start     Ordered   12/02/17 [phone]  For home use only DME Walker rolling  Once    Comments:  5 wheel  Question:  Patient needs a walker to treat with the following condition  Answer:  Weakness   12/02/17 9604          Follow-up Information    [name], MD. Schedule an appointment as soon as possible for a visit in 1 week(s).   Specialty:  Family Medicine Contact information: [address] [place] [address] [phone]        [facility]. Schedule an appointment  as soon as possible for a visit in 1 week(s).   Why:  Wernikes encephalopathy Contact information: [address]     [address] [place]  [phone]          Major procedures and Radiology Reports - PLEASE review detailed and final reports thoroughly  -        Ct Angio Head W Or Wo Contrast  Result Date: 11/29/2017 CLINICAL DATA:  Altered mental status and seizures. Symmetric thalamic signal abnormality on MRI. EXAM: CT ANGIOGRAPHY HEAD AND NECK TECHNIQUE: Multidetector CT imaging of the head and neck was performed using the standard protocol during bolus administration of intravenous contrast. Multiplanar CT image reconstructions and MIPs were obtained to evaluate the vascular anatomy. Carotid stenosis measurements (when applicable) are obtained utilizing NASCET criteria, using the distal internal carotid diameter as the denominator. CONTRAST:  75mL ISOVUE-370 IOPAMIDOL (ISOVUE-370) INJECTION 76% COMPARISON:  Brain MRI 11/29/2017.  No prior angiographic imaging. FINDINGS: CT HEAD FINDINGS Brain: There is no evidence of acute large territory infarct, intracranial hemorrhage, mass effect, or extra-axial fluid collection. There is no definite CT correlate of the bithalamic abnormality on MRI. The ventricles and sulci are normal. Vascular: As below. Skull: No fracture or focal osseous lesion. Sinuses: Visualized paranasal sinuses and mastoid air cells are clear. Orbits: Unremarkable. Review of the MIP images confirms the above findings CTA NECK FINDINGS Aortic arch: Normal variant aortic arch branching pattern with common origin of the brachiocephalic and left common carotid arteries. Widely patent arch vessel origins. Right carotid system: Patent without evidence of dissection or stenosis. Left carotid system: Patent without evidence of dissection or stenosis. Mild atherosclerotic plaque in the carotid bulb. Vertebral arteries: Patent without evidence of dissection or stenosis. Slightly dominant left vertebral artery. Skeleton: No acute osseous abnormality or suspicious osseous lesion. Other neck: No evidence of acute  abnormality or mass. Upper chest: Moderately advanced emphysema in the lung apices with dependent subsegmental atelectasis. Review of the MIP images confirms the above findings CTA HEAD FINDINGS Anterior circulation: The internal carotid arteries are widely patent from skull base to carotid termini. ACAs and MCAs are patent without evidence of proximal branch occlusion or significant stenosis. An early bifurcation of the left MCA is incidentally noted, a normal variant. No aneurysm is identified. Posterior circulation: The intracranial vertebral arteries are widely patent to the basilar. Patent PICA and SCA origins are identified bilaterally. The basilar artery is widely patent. Both PCAs are patent without evidence of significant stenosis. There is a fetal origin of the right PCA. No aneurysm is identified. Venous sinuses: As permitted by contrast timing, patent. Anatomic variants: Fetal right PCA. Delayed phase: No abnormal enhancement. Review of the MIP images confirms the above findings IMPRESSION: 1. No large vessel occlusion, significant stenosis, or aneurysm in the head and neck. 2.  Emphysema (ICD10-J43.9). Electronically Signed   By: Sebastian AcheAllen  Grady M.D.   On: 11/29/2017 21:59   Ct Head Wo Contrast  Result Date: 11/28/2017 CLINICAL DATA:  Altered mental status EXAM: CT HEAD WITHOUT CONTRAST TECHNIQUE: Contiguous axial images were obtained from the base of the skull through the vertex without intravenous contrast. COMPARISON:  07/18/2015 FINDINGS: Brain: No evidence of acute infarction, hemorrhage, hydrocephalus, extra-axial collection or mass lesion/mass effect. Vascular: No hyperdense vessel or unexpected calcification. Skull: Normal. Negative for fracture or focal lesion. Sinuses/Orbits: No acute finding. Other: None. IMPRESSION: Normal head CT Electronically Signed   By: Alcide CleverMark  Lukens M.D.   On: 11/28/2017 15:07  Ct Angio Neck W Or Wo Contrast  Result Date: 11/29/2017 CLINICAL DATA:  Altered  mental status and seizures. Symmetric thalamic signal abnormality on MRI. EXAM: CT ANGIOGRAPHY HEAD AND NECK TECHNIQUE: Multidetector CT imaging of the head and neck was performed using the standard protocol during bolus administration of intravenous contrast. Multiplanar CT image reconstructions and MIPs were obtained to evaluate the vascular anatomy. Carotid stenosis measurements (when applicable) are obtained utilizing NASCET criteria, using the distal internal carotid diameter as the denominator. CONTRAST:  75mL ISOVUE-370 IOPAMIDOL (ISOVUE-370) INJECTION 76% COMPARISON:  Brain MRI 11/29/2017.  No prior angiographic imaging. FINDINGS: CT HEAD FINDINGS Brain: There is no evidence of acute large territory infarct, intracranial hemorrhage, mass effect, or extra-axial fluid collection. There is no definite CT correlate of the bithalamic abnormality on MRI. The ventricles and sulci are normal. Vascular: As below. Skull: No fracture or focal osseous lesion. Sinuses: Visualized paranasal sinuses and mastoid air cells are clear. Orbits: Unremarkable. Review of the MIP images confirms the above findings CTA NECK FINDINGS Aortic arch: Normal variant aortic arch branching pattern with common origin of the brachiocephalic and left common carotid arteries. Widely patent arch vessel origins. Right carotid system: Patent without evidence of dissection or stenosis. Left carotid system: Patent without evidence of dissection or stenosis. Mild atherosclerotic plaque in the carotid bulb. Vertebral arteries: Patent without evidence of dissection or stenosis. Slightly dominant left vertebral artery. Skeleton: No acute osseous abnormality or suspicious osseous lesion. Other neck: No evidence of acute abnormality or mass. Upper chest: Moderately advanced emphysema in the lung apices with dependent subsegmental atelectasis. Review of the MIP images confirms the above findings CTA HEAD FINDINGS Anterior circulation: The internal carotid  arteries are widely patent from skull base to carotid termini. ACAs and MCAs are patent without evidence of proximal branch occlusion or significant stenosis. An early bifurcation of the left MCA is incidentally noted, a normal variant. No aneurysm is identified. Posterior circulation: The intracranial vertebral arteries are widely patent to the basilar. Patent PICA and SCA origins are identified bilaterally. The basilar artery is widely patent. Both PCAs are patent without evidence of significant stenosis. There is a fetal origin of the right PCA. No aneurysm is identified. Venous sinuses: As permitted by contrast timing, patent. Anatomic variants: Fetal right PCA. Delayed phase: No abnormal enhancement. Review of the MIP images confirms the above findings IMPRESSION: 1. No large vessel occlusion, significant stenosis, or aneurysm in the head and neck. 2.  Emphysema (ICD10-J43.9). Electronically Signed   By: Sebastian Ache M.D.   On: 11/29/2017 21:59   Mr Brain Wo Contrast  Result Date: 11/29/2017 CLINICAL DATA:  47 year old female with several days of altered mental status. EXAM: MRI HEAD WITHOUT CONTRAST TECHNIQUE: Multiplanar, multiecho pulse sequences of the brain and surrounding structures were obtained without intravenous contrast. COMPARISON:  Head CT 11/28/2017 and earlier. FINDINGS: Brain: Fairly symmetric diffusion and T2/FLAIR signal abnormality in the medial thalami. Increased trace diffusion is seen on series 5, image 72. ADC appears isointense. Increased T2 and FLAIR hyperintensity, which also involves more of the dorsal thalami than the diffusion abnormality. No associated hemorrhage or mass effect. No other diffusion abnormality. The remaining deep gray matter nuclei appear normal, the mamillary bodies, brainstem, and gray and white matter signal elsewhere appears normal. No chronic encephalomalacia or chronic cerebral blood products identified. No midline shift, mass effect, evidence of mass  lesion, ventriculomegaly, extra-axial collection or acute intracranial hemorrhage. Cervicomedullary junction and pituitary are within normal limits. Vascular: Major  intracranial vascular flow voids are preserved. Skull and upper cervical spine: Negative visible cervical spine. Visualized bone marrow signal is within normal limits. Sinuses/Orbits: Negative orbits. Paranasal sinuses and mastoids are stable and well pneumatized. Other: Visible internal auditory structures appear normal. Scalp and face soft tissues appear negative. IMPRESSION: 1. Abnormal signal abnormality in a symmetric pattern limited to the bilateral medial and dorsal thalami. Diffusion is abnormal although not clearly restricted. There is no associated mass effect or hemorrhage. The remainder of the brain appears normal. 2. Differential diagnosis is broad and includes viral encephalitis, subacute bilateral thalamic infarcts (artery of Percheron), metabolic derangement (Wernicke encephalopathy, extra-pontine osmotic demyelination), hypoxic ischemic encephalopathy, toxic exposure (although globus pallidus are spared), sequelae of seizure, and also Creutzfeldt-Jakob Disease (variant CJD, "hockey stick sign"). Recommend Neurology consultation. Electronically Signed   By: Odessa Fleming M.D.   On: 11/29/2017 06:44   Dg Chest Port 1 View  Result Date: 11/28/2017 CLINICAL DATA:  Altered mental status. EXAM: PORTABLE CHEST 1 VIEW COMPARISON:  10/10/2017 FINDINGS: Heart size and pulmonary vascularity are normal. Minimal atelectasis at the lung bases. No effusions or infiltrates. No acute bone abnormality. Previous open reduction and internal fixation of multiple left rib fractures. IMPRESSION: Minimal atelectasis at the lung bases. Electronically Signed   By: Francene Boyers M.D.   On: 11/28/2017 15:35    Micro Results    Recent Results (from the past 240 hour(s))  CSF culture     Status: None (Preliminary result)   Collection Time: 11/29/17  4:45 PM   Result Value Ref Range Status   Specimen Description CSF  Final   Special Requests NONE  Final   Gram Stain NO WBC SEEN NO ORGANISMS SEEN   Final   Culture   Final    NO GROWTH 2 DAYS Performed at [facility], [address], [place], [place] [address]    Report Status PENDING  Incomplete    Today   Subjective    Niang [place]ree today has no headache,no chest abdominal pain,no new weakness tingling or numbness, feels much better wants to go home today.    Objective   Blood pressure 115/76, pulse 82, temperature 98.3 F (36.8 C), temperature source Oral, resp. rate 18, weight 65.2 kg, SpO2 92 %.   Intake/Output Summary (Last 24 hours) at 12/02/2017 0834 Last data filed at 12/02/2017 0800 Gross per 24 hour  Intake 50 ml  Output 2150 ml  Net -2100 ml    Exam Awake Alert, Oriented x 3, No new F.N deficits, Normal affect [place].AT,PERRAL Supple Neck,No JVD, No cervical lymphadenopathy appriciated.  Symmetrical Chest wall movement, Good air movement bilaterally, CTAB RRR,No Gallops,Rubs or new Murmurs, No Parasternal Heave +ve B.Sounds, Abd Soft, Non tender, No organomegaly appriciated, No rebound -guarding or rigidity. No Cyanosis, Clubbing or edema, No new Rash or bruise   Data Review   CBC w Diff:  Lab Results  Component Value Date   WBC 7.9 11/29/2017   HGB 12.1 11/29/2017   HGB 13.6 08/19/2012   HCT 37.4 11/29/2017   HCT 39.6 08/19/2012   PLT 290 11/29/2017   PLT 270 08/19/2012   LYMPHOPCT 8 07/18/2015   LYMPHOPCT 11.5 04/03/2011   MONOPCT 6 07/18/2015   MONOPCT 2.0 04/03/2011   EOSPCT 0 07/18/2015   EOSPCT 0.0 04/03/2011   BASOPCT 0 07/18/2015   BASOPCT 0.2 04/03/2011    CMP:  Lab Results  Component Value Date   NA 141 12/01/2017   NA 137 08/19/2012  K 3.7 12/01/2017   K 4.3 08/19/2012   CL 104 12/01/2017   CL 103 08/19/2012   CO2 26 12/01/2017   CO2 30 08/19/2012   BUN <5 (L) 12/01/2017   BUN 5 (L) 08/19/2012   CREATININE 0.84 12/01/2017    CREATININE 0.65 08/19/2012   PROT 6.6 11/28/2017   PROT 6.8 08/19/2012   ALBUMIN 3.2 (L) 11/28/2017   ALBUMIN 3.2 (L) 08/19/2012   BILITOT 2.2 (H) 11/28/2017   BILITOT 0.8 08/19/2012   ALKPHOS 60 11/28/2017   ALKPHOS 77 08/19/2012   AST 30 11/28/2017   AST 22 08/19/2012   ALT 19 11/28/2017   ALT 15 08/19/2012  .   Total Time in preparing paper work, data evaluation and todays exam - 35 minutes  Susa Raring M.D on 12/02/2017 at 8:34 AM  [facility]   Office  [phone]

## 2017-12-02 NOTE — Progress Notes (Addendum)
Subjective:  Patient in bed sleeping, NAD. Says she feels better and needs to/ wants to go home today. States  Her mouth is sore from not wearing her partial dentures.  Exam: Vitals:   12/01/17 2127 12/02/17 0602  BP: 117/80 115/76  Pulse: (!) 102 82  Resp: 16 18  Temp: 98.5 F (36.9 C) 98.3 F (36.8 C)  SpO2: 91% 92%   Gen: In bed, NAD Resp: non-labored breathing, no acute distress Abd: soft, nt Musculoskeletal: left arm swollen.   Neurological Examination Mental Status: Alert, oriented place, month. Year, (age 47 today) thought content appropriate.  Speech fluent without evidence of aphasia.  Able to follow commands without difficulty. Still slow to respond,but is responsive.  Cranial Nerves: II: Visual fields grossly normal,  III,IV, VI: ptosis not present, extra-ocular motions intact bilaterally, pupils equal, round, reactive to light and accommodation V,VII: smile symmetric, facial light touch sensation normal bilaterally VIII: hearing normal bilaterally IX,X: uvula rises symmetrically XI: bilateral shoulder shrug XII: midline tongue extension Motor: Right : Upper extremity   4/5  Left:     Upper extremity   4/5  Lower extremity   4/5  Lower extremity   4/5 Able to lift legs antigravity Tone and bulk:normal tone throughout; no atrophy noted Sensory: light touch decreased BLE,  Deep Tendon Reflexes: 2+ and symmetric biceps and patella Cerebellar: normal FNF and HTS still with some ataxia Gait: deferred    Pertinent Labs: B1 pending Anti-GQ 1B pending CSF-unremarkable UA: + UTI   Valentina LucksJessica Williams, MSN, NP-C [facility] [phone]  Patient left prior to me being able to see her.   Impression: 47 yo F with persistent nausea and vomiting for multiple weeks who presents with ataxia, ophthalmoplegia, encephalopathy, areflexia.  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  This fits very well with the MRI imaging.  This also supported by the fact that she has had marked improvement with thiamine repletion.  One other consideration with areflexia in the symptoms would be Bicker[place] brainstem encephalitis coupled with Christianne DolinMiller Fisher syndrome which are both frequently anti-GQ1b entities.  The imaging is not typical for this, and I would not expect the improvement without treatment.  Recommendations: 1) change Depakote to 750 mg twice daily 2) continue thiamine 500 mg 3 times daily, following initial 3 days I would favor using 100 mg oral daily 3) she is likely going to need PT, OT 4) follow-up thiamine, antibodies  Ritta SlotMcNeill Pristine Gladhill, MD [facility] [phone]  If 7pm- 7am, please page neurology on call as listed in [facility].

## 2017-12-02 NOTE — Discharge Instructions (Signed)
Follow with Primary MD Nicolasa DuckingGarlick, William, MD in 7 days   Get CBC, CMP, 2 view Chest X ray -  checked  by Primary MD  in 5-7 days   Activity: As tolerated with Full fall precautions use walker/cane & assistance as needed  Disposition Home    Diet: Heart Healthy    Special Instructions: If you have smoked or chewed Tobacco  in the last 2 yrs please stop smoking, stop any regular Alcohol  and or any Recreational drug use.  On your next visit with your primary care physician please Get Medicines reviewed and adjusted.  Please request your Prim.MD to go over all Hospital Tests and Procedure/Radiological results at the follow up, please get all Hospital records sent to your Prim MD by signing hospital release before you go home.  If you experience worsening of your admission symptoms, develop shortness of breath, life threatening emergency, suicidal or homicidal thoughts you must seek medical attention immediately by calling 911 or calling your MD immediately  if symptoms less severe.  You Must read complete instructions/literature along with all the possible adverse reactions/side effects for all the Medicines you take and that have been prescribed to you. Take any new Medicines after you have completely understood and accpet all the possible adverse reactions/side effects.   Do not drive, operate heavy machinery, perform activities at heights, swimming or participation in water activities or provide baby sitting services until you have seen by Primary MD or a Neurologist and advised to do so again.

## 2017-12-03 LAB — CSF CULTURE W GRAM STAIN
Culture: NO GROWTH
Gram Stain: NONE SEEN

## 2017-12-04 LAB — IGG CSF INDEX
ALBUMIN: 3.1 g/dL — AB (ref 3.5–5.5)
Albumin CSF-mCnc: 22 mg/dL (ref 11–48)
CSF IgG Index: 0.6 (ref 0.0–0.7)
IGG CSF: 3 mg/dL (ref 0.0–8.6)
IgG (Immunoglobin G), Serum: 734 mg/dL (ref 700–1600)
IgG/Alb Ratio, CSF: 0.14 (ref 0.00–0.25)

## 2017-12-04 LAB — OLIGOCLONAL BANDS, CSF + SERM

## 2017-12-05 LAB — VITAMIN B1: Vitamin B1 (Thiamine): 27.3 nmol/L — ABNORMAL LOW (ref 66.5–200.0)

## 2017-12-07 LAB — MISC LABCORP TEST (SEND OUT): Labcorp test code: 9985

## 2017-12-13 ENCOUNTER — Ambulatory Visit: Payer: Medicaid Other | Admitting: Neurology

## 2017-12-13 ENCOUNTER — Encounter: Payer: Self-pay | Admitting: Neurology

## 2017-12-13 ENCOUNTER — Telehealth: Payer: Self-pay | Admitting: Neurology

## 2017-12-13 VITALS — BP 99/65 | HR 111 | Ht 66.0 in | Wt 141.5 lb

## 2017-12-13 DIAGNOSIS — R202 Paresthesia of skin: Secondary | ICD-10-CM | POA: Diagnosis not present

## 2017-12-13 DIAGNOSIS — R269 Unspecified abnormalities of gait and mobility: Secondary | ICD-10-CM | POA: Diagnosis not present

## 2017-12-13 NOTE — Progress Notes (Signed)
PATIENT: Debbie Gay DOB: 12/26/1970  Chief Complaint  Patient presents with  . Altered Mental Status    She had an episode of altered mental status and was treated in the ED on 11/28/17.  She was taking Depakote but has sinced stopped the medication.  She does not feel she had a seizure.  She feels it was due to her significantly low B1.  While in the hosital, she had an abnormal MRI.  Marland Kitchen PCP    Governor Specking, MD     HISTORICAL  Debbie Gay is a 47 year old female, seen in request by her primary care physician Dr.Garlick, Chrissie Noa for evaluation of altered mental status, initial evaluation was on December 13, 2017.  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 thiamine 100 mg supplement,  I personally reviewed CT, and MRI of the brain, abnormal signal abnormality symmetric at bilateral medial and dorsal thalami,  CT angiogram of the head and neck showed no large vessel disease.  Laboratory evaluations in 2019 showed elevated valproic acid 126, mild elevated TSH 5.7, BMP, vitamin B1 was decreased 27, negative HIV, troponin  CSF studies, herpes PCR was negative, total protein was 38, glucose was 53, zero oligoclonal banding, WBC of 2, RBC of 1  UDS was positive for benzodiazepine, tricyclic  EEG on November 29, 2017 showed no significant abnormality.  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  ALLERGIES: Allergies  Allergen Reactions  . Levaquin [Levofloxacin] Swelling  . Prednisone Shortness Of Breath and Swelling  . Penicillins     Has patient had a PCN reaction causing immediate rash, facial/tongue/throat swelling, SOB or lightheadedness with hypotension: Unknown Has patient had a PCN reaction causing severe rash involving mucus membranes or skin necrosis: Unknown Has patient had a PCN reaction that required hospitalization: Unknown Has patient had a PCN reaction occurring within the last 10 years: Unknown If all of the above answers are "NO", then may proceed with Cephalosporin use.    HOME MEDICATIONS: Current Outpatient Medications  Medication Sig Dispense Refill  . acetaminophen (TYLENOL) 325 MG tablet Take 650 mg by mouth daily as needed for mild pain or moderate pain.     Marland Kitchen albuterol (PROVENTIL HFA;VENTOLIN HFA) 108 (90 Base) MCG/ACT inhaler Inhale 2 puffs into the lungs every 6 (six) hours as needed for wheezing or shortness of breath.     . alprazolam (XANAX) 2 MG tablet Take 0.5 tablets (1 mg total) by mouth at bedtime. 30 tablet 0  .  bismuth subsalicylate (PEPTO BISMOL) 262 MG chewable tablet Chew 2 tablets (524 mg total) by mouth 4 (four) times daily -  before meals and at bedtime for 12 days. 30 tablet 0  . clarithromycin (BIAXIN) 500 MG tablet Take 1 tablet (500 mg total) by mouth every 12 (twelve) hours. 24 tablet 0  . KLOR-CON M10 10 MEQ tablet Take 20 mEq by mouth  2 (two) times daily.    . metroNIDAZOLE (FLAGYL) 500 MG tablet Take 1 tablet (500 mg total) by mouth every 8 (eight) hours. 36 tablet 0  . Oxycodone HCl 10 MG TABS Take 20 mg by mouth every 4 (four) hours as needed.     . pantoprazole (PROTONIX) 40 MG tablet Take 1 tablet (40 mg total) by mouth daily. 30 tablet 1  . QUEtiapine (SEROQUEL) 100 MG tablet Take 100-200 mg by mouth at bedtime as needed.     . tamsulosin (FLOMAX) 0.4 MG CAPS capsule Take 1 capsule (0.4 mg total) by mouth daily. 30 capsule 0  . thiamine (VITAMIN B-1) 100 MG tablet Take 1 tablet (100 mg total) by mouth daily. 30 tablet 0  . tiotropium (SPIRIVA) 18 MCG inhalation capsule Place 18 mcg into inhaler and inhale daily as needed (for wheezing/shortness of breath).      No current facility-administered medications for this visit.     PAST MEDICAL HISTORY: Past Medical History:  Diagnosis Date  . Asthma   . Bipolar 1 disorder (HCC)   . Chronic pain   . COPD (chronic obstructive pulmonary disease) (HCC)   . Depression   . GERD (gastroesophageal reflux disease)   . History of heart attack   . IBS (irritable bowel syndrome)   . Myocardial infarction (HCC)   . Panic attacks     PAST SURGICAL HISTORY: Past Surgical History:  Procedure Laterality Date  . ABDOMINAL HYSTERECTOMY    . COLONOSCOPY WITH PROPOFOL N/A 10/30/2014   Procedure: COLONOSCOPY WITH PROPOFOL;  Surgeon: Christena Deem, MD;  Location: [facility] ENDOSCOPY;  Service: Endoscopy;  Laterality: N/A;  . ESOPHAGOGASTRODUODENOSCOPY (EGD) WITH PROPOFOL N/A 10/30/2014   Procedure: ESOPHAGOGASTRODUODENOSCOPY (EGD) WITH PROPOFOL;  Surgeon: Christena Deem, MD;  Location: [facility] ENDOSCOPY;  Service: Endoscopy;  Laterality: N/A;  . ESOPHAGOGASTRODUODENOSCOPY (EGD) WITH PROPOFOL N/A 11/23/2017   Procedure: ESOPHAGOGASTRODUODENOSCOPY (EGD) WITH PROPOFOL;  Surgeon: Toledo, Boykin Nearing, MD;  Location: [facility] ENDOSCOPY;  Service: Gastroenterology;  Laterality: N/A;    FAMILY  HISTORY: Family History  Problem Relation Age of Onset  . COPD Mother   . Irritable bowel syndrome Mother   . Glaucoma Father   . Hyperlipidemia Father     SOCIAL HISTORY: Social History   Socioeconomic History  . Marital status: Divorced    Spouse name: Not on file  . Number of children: 3  . Years of education: GED/some college  . Highest education level: Not on file  Occupational History  . Occupation: Disabled  Social Needs  . Financial resource strain: Not on file  . Food insecurity:    Worry: Not on file    Inability: Not on file  . Transportation needs:    Medical: Not on file    Non-medical: Not on file  Tobacco Use  . Smoking status: Current Every Day Smoker    Packs/day: 0.50    Years: 20.00    Pack years: 10.00  . Smokeless tobacco: Never Used  Substance and Sexual Activity  . Alcohol use: No  . Drug use: No  . Sexual activity:  Not Currently  Lifestyle  . Physical activity:    Days per week: Not on file    Minutes per session: Not on file  . Stress: Not on file  Relationships  . Social connections:    Talks on phone: Not on file    Gets together: Not on file    Attends religious service: Not on file    Active member of club or organization: Not on file    Attends meetings of clubs or organizations: Not on file    Relationship status: Not on file  . Intimate partner violence:    Fear of current or ex partner: Not on file    Emotionally abused: Not on file    Physically abused: Not on file    Forced sexual activity: Not on file  Other Topics Concern  . Not on file  Social History Narrative   Right-handed.   Drinks four 16oz of [employer] bottles per day.   Lives at home grandmother, uncle and daughter.     PHYSICAL EXAM   Vitals:   12/13/17 1042  BP: 99/65  Pulse: (!) 111  Weight: 141 lb 8 oz (64.2 kg)  Height: 5\' 6"  (1.676 m)    Not recorded      Body mass index is 22.84 kg/m.  PHYSICAL EXAMNIATION:  Gen: NAD, conversant,  well nourised, obese, well groomed                     Cardiovascular: Regular rate rhythm, no peripheral edema, warm, nontender. Eyes: Conjunctivae clear without exudates or hemorrhage Neck: Supple, no carotid bruits. Pulmonary: Clear to auscultation bilaterally   NEUROLOGICAL EXAM:  MENTAL STATUS: Speech:    Speech is normal; fluent and spontaneous with normal comprehension.  Cognition:     Orientation to time, place and person     Normal recent and remote memory     Normal Attention span and concentration     Normal Language, naming, repeating,spontaneous speech     Fund of knowledge   CRANIAL NERVES: CN II: Visual fields are full to confrontation.  Pupils are round equal and briskly reactive to light. CN III, IV, VI: extraocular movement are normal. No ptosis. CN V: Facial sensation is intact to pinprick in all 3 divisions bilaterally. Corneal responses are intact.  CN VII: Face is symmetric with normal eye closure and smile. CN VIII: Hearing is normal to rubbing fingers CN IX, X: Palate elevates symmetrically. Phonation is normal. CN XI: Head turning and shoulder shrug are intact CN XII: Tongue is midline with normal movements and no atrophy.  MOTOR: Normal tone, bulk and strength, she has mild bilateral total extension weakness  REFLEXES: Reflexes are absent, plantar responses are flexor  SENSORY: Length dependent decreased to light touch, pinprick, positional sensation and vibratory sensation at toes  COORDINATION: Rapid alternating movements and fine finger movements are intact. There is no dysmetria on finger-to-nose and heel-knee-shin.    GAIT/STANCE: She needs pushed up to get up from seated position, wide-based, unsteady gait   DIAGNOSTIC DATA (LABS, IMAGING, TESTING) - I reviewed patient records, labs, notes, testing and imaging myself where available.   ASSESSMENT AND PLAN  Debbie Gay is a 47 y.o. female    Acute onset of confusion unsteady gait  in November 2019  In the setting of significant GI symptoms, duodenal ulcer, constriction  Continue vitamin B1 supplement  Continue physical therapy  EMG nerve conduction study    Levert FeinsteinYijun Shylynn Bruning, M.D.  Ph.D.  [facility] [address], [place],  [address] Ph: [phone] Fax: [phone]  CC: Referring Provider

## 2017-12-13 NOTE — Patient Instructions (Signed)
Electromyoneurogram Electromyoneurogram is a test to check how well your muscles and nerves are working. This procedure includes the combined use of electromyogram (EMG) and nerve conduction study (NCS). EMG is used to look for muscular disorders. NCS, which is also called electroneurogram, measures how well your nerves are controlling your muscles. The procedures are usually performed together to check if your muscles and nerves are healthy. If the reaction to testing is abnormal, this can indicate disease or injury, such as peripheral nerve damage. Tell a health care provider about:  Any allergies you have.  All medicines you are taking, including vitamins, herbs, eye drops, creams, and over-the-counter medicines.  Any problems you or family members have had with anesthetic medicines.  Any blood disorders you have.  Any surgeries you have had.  Any medical conditions you have.  Any pacemaker you have. What are the risks? Generally, this is a safe procedure. However, problems may occur, including:  Infection where the electrodes were inserted.  Bleeding.  What happens before the procedure?  Ask your health care provider about: ? Changing or stopping your regular medicines. This is especially important if you are taking diabetes medicines or blood thinners. ? Taking medicines such as aspirin and ibuprofen. These medicines can thin your blood. Do not take these medicines before your procedure if your health care provider instructs you not to.  Your health care provider may ask you to avoid: ? Caffeine, such as coffee and tea. ? Nicotine. This includes cigarettes and anything with tobacco.  Do not use lotions or creams on the same day that you will be having the procedure. What happens during the procedure? For EMG:  Your health care provider will ask you to stay in a position so that he or she can access the muscle that will be studied. You may be standing, sitting down, or  lying down.  You may be given a medicine that numbs the area (local anesthetic).  A very thin needle that has an electrode on it will be inserted into your muscle.  Another small electrode will be placed on your skin near the muscle.  Your health care provider will ask you to continue to remain still.  The electrodes will send a signal that tells about the electrical activity of your muscles. You may see this on a monitor or hear it in the room.  After your muscles have been studied at rest, your health care provider will ask you to contract or flex your muscles. The electrodes will send a signal that tells about the electrical activity of your muscles.  Your health care provider will remove the electrodes and the electrode needles when the procedure is finished. The procedure may vary among health care providers and hospitals. For NCS:  An electrode that records your nerve activity (recording electrode) will be placed on your skin by the muscle that is being studied.  An electrode that is used as a reference (reference electrode) will be placed near the recording electrode.  A paste or gel will be applied to your skin between the recording electrode and the reference electrode.  Your nerve will be stimulated with a mild shock. Your health care provider will measure how much time it takes for your muscle to react.  Your health care provider will remove the electrodes and the gel when the procedure is finished. The procedure may vary among health care providers and hospitals. What happens after the procedure?  It is your responsibility to obtain your test   results. Ask your health care provider or the department performing the test when and how you will get your results.  Your health care provider may: ? Give you medicines for any pain. ? Monitor the insertion sites to make sure that they stop bleeding. This information is not intended to replace advice given to you by your health  care provider. Make sure you discuss any questions you have with your health care provider. Document Released: 04/21/2004 Document Revised: 05/27/2015 Document Reviewed: 02/09/2014 Elsevier Interactive Patient Education  2018 [employer]vier Inc[employer]  

## 2017-12-13 NOTE — Telephone Encounter (Signed)
error 

## 2018-01-04 ENCOUNTER — Other Ambulatory Visit: Payer: Self-pay | Admitting: Gastroenterology

## 2018-01-04 ENCOUNTER — Ambulatory Visit
Admission: RE | Admit: 2018-01-04 | Discharge: 2018-01-04 | Disposition: A | Discharge status: Home / Self Care | Payer: Medicaid Other | Source: Ambulatory Visit | Attending: Gastroenterology | Admitting: Gastroenterology

## 2018-01-04 DIAGNOSIS — R1031 Right lower quadrant pain: Secondary | ICD-10-CM

## 2018-01-04 MED ORDER — IOPAMIDOL (ISOVUE-300) INJECTION 61%
80.0000 mL | Freq: Once | INTRAVENOUS | Status: AC | PRN
  Administered 2018-01-04: 80 mL via INTRAVENOUS

## 2018-01-25 ENCOUNTER — Telehealth: Payer: Self-pay | Admitting: Neurology

## 2018-01-25 ENCOUNTER — Encounter: Payer: Medicaid Other | Admitting: Neurology

## 2018-01-25 NOTE — Telephone Encounter (Signed)
FYI: Pt called in to cancel her apt for today, she won't be able to make it due to being sick/I reminded her of the no show policy

## 2018-01-28 ENCOUNTER — Encounter: Payer: Self-pay | Admitting: Neurology

## 2018-01-28 NOTE — Telephone Encounter (Signed)
Ok thanks 

## 2018-02-05 ENCOUNTER — Ambulatory Visit: Payer: Medicaid Other | Admitting: Certified Registered"

## 2018-02-05 ENCOUNTER — Encounter
Admission: RE | Disposition: A | Discharge status: Home / Self Care | Payer: Self-pay | Source: Home / Self Care | Attending: Gastroenterology

## 2018-02-05 ENCOUNTER — Encounter: Payer: Self-pay | Admitting: *Deleted

## 2018-02-05 ENCOUNTER — Ambulatory Visit
Admission: RE | Admit: 2018-02-05 | Discharge: 2018-02-05 | Disposition: A | Discharge status: Home / Self Care | Payer: Medicaid Other | Attending: Gastroenterology | Admitting: Gastroenterology

## 2018-02-05 DIAGNOSIS — Z79899 Other long term (current) drug therapy: Secondary | ICD-10-CM | POA: Insufficient documentation

## 2018-02-05 DIAGNOSIS — K219 Gastro-esophageal reflux disease without esophagitis: Secondary | ICD-10-CM | POA: Insufficient documentation

## 2018-02-05 DIAGNOSIS — R1031 Right lower quadrant pain: Secondary | ICD-10-CM | POA: Insufficient documentation

## 2018-02-05 DIAGNOSIS — K5730 Diverticulosis of large intestine without perforation or abscess without bleeding: Secondary | ICD-10-CM | POA: Insufficient documentation

## 2018-02-05 DIAGNOSIS — I252 Old myocardial infarction: Secondary | ICD-10-CM | POA: Insufficient documentation

## 2018-02-05 DIAGNOSIS — K581 Irritable bowel syndrome with constipation: Secondary | ICD-10-CM | POA: Diagnosis not present

## 2018-02-05 DIAGNOSIS — Z79890 Hormone replacement therapy (postmenopausal): Secondary | ICD-10-CM | POA: Diagnosis not present

## 2018-02-05 DIAGNOSIS — J449 Chronic obstructive pulmonary disease, unspecified: Secondary | ICD-10-CM | POA: Diagnosis not present

## 2018-02-05 DIAGNOSIS — G8929 Other chronic pain: Secondary | ICD-10-CM | POA: Diagnosis not present

## 2018-02-05 DIAGNOSIS — R634 Abnormal weight loss: Secondary | ICD-10-CM | POA: Diagnosis not present

## 2018-02-05 DIAGNOSIS — F319 Bipolar disorder, unspecified: Secondary | ICD-10-CM | POA: Diagnosis not present

## 2018-02-05 HISTORY — PX: COLONOSCOPY WITH PROPOFOL: SHX5780

## 2018-02-05 SURGERY — COLONOSCOPY WITH PROPOFOL
Anesthesia: General

## 2018-02-05 MED ORDER — FENTANYL CITRATE (PF) 100 MCG/2ML IJ SOLN
INTRAMUSCULAR | Status: AC
  Filled 2018-02-05: qty 2

## 2018-02-05 MED ORDER — SODIUM CHLORIDE 0.9 % IV SOLN
INTRAVENOUS | Status: DC
  Administered 2018-02-05 (×2): via INTRAVENOUS

## 2018-02-05 MED ORDER — PROPOFOL 500 MG/50ML IV EMUL
INTRAVENOUS | Status: DC | PRN
  Administered 2018-02-05: 180 ug/kg/min via INTRAVENOUS

## 2018-02-05 MED ORDER — FENTANYL CITRATE (PF) 100 MCG/2ML IJ SOLN
INTRAMUSCULAR | Status: DC | PRN
  Administered 2018-02-05 (×2): 25 ug via INTRAVENOUS

## 2018-02-05 MED ORDER — PROPOFOL 10 MG/ML IV BOLUS
INTRAVENOUS | Status: DC | PRN
  Administered 2018-02-05: 50 mg via INTRAVENOUS

## 2018-02-05 MED ORDER — MIDAZOLAM HCL 2 MG/2ML IJ SOLN
INTRAMUSCULAR | Status: AC
  Filled 2018-02-05: qty 2

## 2018-02-05 NOTE — Anesthesia Post-op Follow-up Note (Signed)
Anesthesia QCDR form completed.        

## 2018-02-05 NOTE — Anesthesia Preprocedure Evaluation (Signed)
Anesthesia Evaluation  Patient identified by MRN, date of birth, ID band Patient awake    Reviewed: Allergy & Precautions, NPO status , Patient's Chart, lab work & pertinent test results  History of Anesthesia Complications Negative for: history of anesthetic complications  Airway Mallampati: III  TM Distance: >3 FB Neck ROM: Full    Dental  (+) Partial Upper, Edentulous Lower   Pulmonary asthma , neg sleep apnea, COPD,  COPD inhaler, Current Smoker,    breath sounds clear to auscultation- rhonchi (-) wheezing      Cardiovascular + CAD and + Past MI  (-) Cardiac Stents and (-) CABG  Rhythm:Regular Rate:Normal - Systolic murmurs and - Diastolic murmurs    Neuro/Psych neg Seizures PSYCHIATRIC DISORDERS Anxiety Depression Bipolar Disorder negative neurological ROS     GI/Hepatic Neg liver ROS, GERD  ,  Endo/Other  negative endocrine ROSneg diabetes  Renal/GU negative Renal ROS     Musculoskeletal negative musculoskeletal ROS (+)   Abdominal (+) - obese,   Peds  Hematology negative hematology ROS (+)   Anesthesia Other Findings Past Medical History: No date: Asthma No date: Bipolar 1 disorder (HCC) No date: Chronic pain No date: COPD (chronic obstructive pulmonary disease) (HCC) No date: Depression No date: GERD (gastroesophageal reflux disease) No date: History of heart attack No date: IBS (irritable bowel syndrome) No date: Myocardial infarction (HCC) No date: Panic attacks   Reproductive/Obstetrics                             Anesthesia Physical Anesthesia Plan  ASA: III  Anesthesia Plan: General   Post-op Pain Management:    Induction: Intravenous  PONV Risk Score and Plan: 1 and Propofol infusion  Airway Management Planned: Natural Airway  Additional Equipment:   Intra-op Plan:   Post-operative Plan:   Informed Consent: I have reviewed the patients History and  Physical, chart, labs and discussed the procedure including the risks, benefits and alternatives for the proposed anesthesia with the patient or authorized representative who has indicated his/her understanding and acceptance.     Dental advisory given  Plan Discussed with: CRNA and Anesthesiologist  Anesthesia Plan Comments:         Anesthesia Quick Evaluation

## 2018-02-05 NOTE — Transfer of Care (Signed)
Immediate Anesthesia Transfer of Care Note  Patient: Debbie Gay  Procedure(s) Performed: COLONOSCOPY WITH PROPOFOL (N/A )  Patient Location: PACU  Anesthesia Type:General  Level of Consciousness: awake  Airway & Oxygen Therapy: Patient Spontanous Breathing and Patient connected to nasal cannula oxygen  Post-op Assessment: Report given to RN and Post -op Vital signs reviewed and stable  Post vital signs: Reviewed and stable  Last Vitals:  Vitals Value Taken Time  BP 127/92 02/05/2018  3:02 PM  Temp 36.6 C 02/05/2018  3:02 PM  Pulse 83 02/05/2018  3:05 PM  Resp 10 02/05/2018  3:05 PM  SpO2 98 % 02/05/2018  3:05 PM  Vitals shown include unvalidated device data.  Last Pain:  Vitals:   02/05/18 1502  TempSrc: Tympanic  PainSc: 0-No pain         Complications: No apparent anesthesia complications

## 2018-02-05 NOTE — Op Note (Signed)
[facility] Patient Name: Debbie Gay Procedure Date: 02/05/2018 2:21 PM MRN: [id number] Account #: [id number] Date of Birth: 1970/09/06 Admit Type: Outpatient Age: 48 Room: [facility] ENDO ROOM 2 Gender: Female Note Status: Finalized Procedure:            Colonoscopy Indications:          Abdominal pain in the right lower quadrant Providers:            Christena Deem, MD Referring MD:         Delora Fuel (Referring MD) Medicines:            Monitored Anesthesia Care Complications:        No immediate complications. Procedure:            Pre-Anesthesia Assessment:                       - ASA Grade Assessment: III - A patient with severe                        systemic disease.                       After obtaining informed consent, the colonoscope was                        passed under direct vision. Throughout the procedure,                        the patient's blood pressure, pulse, and oxygen                        saturations were monitored continuously. The                        Colonoscope was introduced through the anus and                        advanced to the the cecum, identified by appendiceal                        orifice and ileocecal valve. The colonoscopy was                        performed without difficulty. The patient tolerated the                        procedure well. The quality of the bowel preparation                        was good except the ascending colon was poor and fair                        except the cecum was poor. The patient tolerated the                        procedure well. Findings:      Multiple small-mouthed diverticula were found in the sigmoid colon and       distal descending colon.      A large amount of semi-liquid stool was found in the ascending colon and  in the cecum, making visualization difficult. Lavage of the area was       performed using a moderate amount and suction,  resulting in clearance       with fair visualization.      The exam was otherwise normal throughout the examined colon.      I attempted to intubate the terminal ileum without success, hoewver the       opening of the TI and valve were normal in appearance without       abnormality.      The retroflexed view of the distal rectum and anal verge was normal and       showed no anal or rectal abnormalities.      The digital rectal exam was normal.      Biopsies for histology were taken with a cold forceps from the right       colon and left colon for evaluation of microscopic colitis. Impression:           - Diverticulosis in the sigmoid colon and in the distal                        descending colon.                       - Stool in the ascending colon and in the cecum.                       - The distal rectum and anal verge are normal on                        retroflexion view.                       - No specimens collected. Recommendation:       - Discharge patient to home.                       - consider small bowel series or CT enterography for                        visualization of the distal ileum for continued                        symptoms.                       - Await pathology results. Procedure Code(s):    --- Professional ---                       [id number], Colonoscopy, flexible; with biopsy, single or                        multiple Diagnosis Code(s):    --- Professional ---                       R10.31, Right lower quadrant pain                       K57.30, Diverticulosis of large intestine without                        perforation or abscess without bleeding  CPT copyright 2018 American Medical Association. All rights reserved. The codes documented in this report are preliminary and upon coder review may  be revised to meet current compliance requirements. Christena Deem, MD 02/05/2018 3:07:00 PM This report has been signed electronically. Number of Addenda:  0 Note Initiated On: 02/05/2018 2:21 PM Scope Withdrawal Time: 0 hours 15 minutes 27 seconds  Total Procedure Duration: 0 hours 25 minutes 29 seconds       [facility]

## 2018-02-05 NOTE — H&P (Signed)
Outpatient short stay form Pre-procedure 02/05/2018 2:13 PM Debbie DeemMartin U Latessa Tillis MD  Primary Physician: Nicolasa DuckingWilliam Garlick, MD  Reason for visit: Colonoscopy  History of present illness: Patient is a 48 year old female presenting today colonoscopy.  She has complaint of chronic constipation and ongoing right lower quadrant pain.  There was mild evidence of colitis on her last colonoscopy.  And has had weight loss.  Did have an EGD on 11/23/2017 with dilatation of a duodenal stenosis.  Seem to resolve the stenosis however she had some issues with abdominal pain afterwards.  He does continue to take a proton pump inhibitor and she takes medications for allergist and joint pains.  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    Current Facility-Administered Medications:  .  0.9 %  sodium chloride infusion, , Intravenous, Continuous, Debbie DeemSkulskie, Quy Lotts U, MD, Last Rate: 20 mL/hr at 02/05/18 1343  Medications Prior to Admission  Medication Sig Dispense Refill Last Dose  . albuterol (PROVENTIL HFA;VENTOLIN HFA) 108 (90 Base) MCG/ACT inhaler Inhale 2 puffs into the lungs every 6 (six) hours as needed for wheezing or shortness of breath.    Past Month at Unknown time  . alprazolam (XANAX) 2 MG tablet Take 0.5 tablets (1 mg total) by mouth at bedtime. 30 tablet 0 02/04/2018 at Unknown time  . atorvastatin (LIPITOR) 20 MG tablet Take 20 mg by mouth daily.   Past Week at Unknown time  . diphenhydrAMINE-APAP, sleep, 38-500 MG TABS Take 1 tablet by mouth at bedtime as needed.   02/04/2018 at Unknown time  . estrogens, conjugated, (PREMARIN) 1.25 MG tablet Take 1.25 mg by mouth daily.   Past Week at Unknown time  . KLOR-CON M10 10 MEQ tablet Take 20 mEq by mouth 2 (two) times daily.   02/04/2018 at Unknown time  . nitroGLYCERIN (NITROLINGUAL) 0.4 MG/SPRAY spray Place 1 spray under the tongue every 5 (five) minutes x 3 doses as needed for chest pain.     . Oxycodone HCl 10 MG  TABS Take 20 mg by mouth every 4 (four) hours as needed.    02/05/2018 at 0600  . polyethylene glycol (MIRALAX / GLYCOLAX) packet Take 17 g by mouth daily.   02/04/2018 at Unknown time  . promethazine (PHENERGAN) 25 MG tablet Take 25 mg by mouth every 6 (six) hours as needed for nausea or vomiting.   02/04/2018 at Unknown time  . thiamine (VITAMIN B-1) 100 MG tablet Take 1 tablet (100 mg total) by mouth daily. 30 tablet 0 02/05/2018 at 0600  . acetaminophen (TYLENOL) 325 MG tablet Take 650 mg by mouth daily as needed for mild pain or moderate pain.     at prn  . clarithromycin (BIAXIN) 500 MG tablet Take 1 tablet (500 mg total) by mouth every 12 (twelve) hours. (Patient not taking: Reported on 02/05/2018) 24 tablet 0 Completed Course at Unknown time  . metroNIDAZOLE (FLAGYL) 500 MG tablet Take 1 tablet (500 mg total) by mouth every 8 (eight) hours. (Patient not taking: Reported on 02/05/2018) 36 tablet 0 Completed Course at Unknown time  . pantoprazole (PROTONIX) 40 MG tablet Take 1 tablet (40 mg total) by mouth daily. 30 tablet 1 Taking  . QUEtiapine (SEROQUEL) 100 MG tablet Take 100-200 mg by mouth at bedtime as needed.    Not Taking at Unknown time  . sucralfate (CARAFATE) 1 g tablet Take 1 g by mouth 4 (four) times daily -  with meals and at bedtime.  Not Taking at Unknown time  . tamsulosin (FLOMAX) 0.4 MG CAPS capsule Take 1 capsule (0.4 mg total) by mouth daily. (Patient not taking: Reported on 02/05/2018) 30 capsule 0 Not Taking at Unknown time  . tiotropium (SPIRIVA) 18 MCG inhalation capsule Place 18 mcg into inhaler and inhale daily as needed (for wheezing/shortness of breath).    Not Taking at Unknown time     Allergies  Allergen Reactions  . Levaquin [Levofloxacin] Swelling  . Prednisone Shortness Of Breath and Swelling  . Penicillins     Has patient had a PCN reaction causing immediate rash, facial/tongue/throat swelling, SOB or lightheadedness with hypotension: Unknown Has patient had a PCN  reaction causing severe rash involving mucus membranes or skin necrosis: Unknown Has patient had a PCN reaction that required hospitalization: Unknown Has patient had a PCN reaction occurring within the last 10 years: Unknown If all of the above answers are "NO", then may proceed with Cephalosporin use.     Past Medical History:  Diagnosis Date  . Asthma   . Bipolar 1 disorder (HCC)   . Chronic pain   . COPD (chronic obstructive pulmonary disease) (HCC)   . Depression   . GERD (gastroesophageal reflux disease)   . History of heart attack   . IBS (irritable bowel syndrome)   . Myocardial infarction (HCC)   . Panic attacks     Review of systems:      Physical Exam    Heart and lungs: Regular rate and rhythm without rub or gallop lungs are bilaterally clear    HEENT: Normocephalic atraumatic eyes are anicteric    Other:    Pertinant exam for procedure: Soft slight protuberance bowel sounds are positive normoactive.  She is got minimal tenderness to palpation.  She points to the right lower quadrant and right mid abdomen where her discomfort is.  There is no mass or rebound.    Planned proceedures: Colonoscopy and indicated procedures. I have discussed the risks benefits and complications of procedures to include not limited to bleeding, infection, perforation and the risk of sedation and the patient wishes to proceed.    Debbie DeemMartin U Bernetta Sutley, MD Gastroenterology 02/05/2018  2:13 PM

## 2018-02-06 ENCOUNTER — Encounter: Payer: Self-pay | Admitting: Gastroenterology

## 2018-02-06 NOTE — Anesthesia Postprocedure Evaluation (Signed)
Anesthesia Post Note  Patient: Debbie Gay  Procedure(s) Performed: COLONOSCOPY WITH PROPOFOL (N/A )  Patient location during evaluation: Endoscopy Anesthesia Type: General Level of consciousness: awake and alert and oriented Pain management: pain level controlled Vital Signs Assessment: post-procedure vital signs reviewed and stable Respiratory status: spontaneous breathing, nonlabored ventilation and respiratory function stable Cardiovascular status: blood pressure returned to baseline and stable Postop Assessment: no signs of nausea or vomiting Anesthetic complications: no     Last Vitals:  Vitals:   02/05/18 1520 02/05/18 1524  BP: (!) 148/89 (!) 137/94  Pulse: 77 76  Resp: 16 11  Temp:    SpO2: 99% 98%    Last Pain:  Vitals:   02/05/18 1524  TempSrc:   PainSc: 0-No pain                 Angeles Zehner

## 2018-02-07 ENCOUNTER — Ambulatory Visit: Payer: Self-pay | Admitting: Urology

## 2018-02-08 ENCOUNTER — Other Ambulatory Visit (HOSPITAL_COMMUNITY): Payer: Self-pay | Admitting: Gastroenterology

## 2018-02-08 ENCOUNTER — Other Ambulatory Visit: Payer: Self-pay | Admitting: Gastroenterology

## 2018-02-08 DIAGNOSIS — R1031 Right lower quadrant pain: Secondary | ICD-10-CM

## 2018-02-11 LAB — SURGICAL PATHOLOGY

## 2018-02-13 ENCOUNTER — Encounter: Payer: Self-pay | Admitting: Urology

## 2018-02-13 ENCOUNTER — Ambulatory Visit: Payer: Self-pay | Admitting: Urology

## 2018-03-01 ENCOUNTER — Encounter: Payer: Medicaid Other | Admitting: Neurology

## 2018-04-10 ENCOUNTER — Telehealth: Payer: Self-pay

## 2018-04-10 NOTE — Telephone Encounter (Signed)
I called patient to cancel her NCV/EMG,  Due to current COVID 19 pandemic, our office is severely reducing in office visits for at least the next 2 weeks, in order to minimize the risk to our patients and healthcare providers. Patient is agreeable and understands. She mentioned that she is still having the same symptoms, but now she is also having problems with her head feeling like it builds up pressure. I advised her that we are capable of doing a virtual visit if she would like to schedule to discuss with Dr. Terrace Arabia since we cannot get her in here for the NCS at this time. She agreed to virtual visit and voiced much appreciation. I explained the Virtual Visit process and got her email address, I have sent the invitation.

## 2018-04-16 ENCOUNTER — Telehealth: Payer: Self-pay | Admitting: Neurology

## 2018-04-16 ENCOUNTER — Ambulatory Visit (INDEPENDENT_AMBULATORY_CARE_PROVIDER_SITE_OTHER): Payer: Medicaid Other | Admitting: Neurology

## 2018-04-16 ENCOUNTER — Encounter: Payer: Self-pay | Admitting: Neurology

## 2018-04-16 ENCOUNTER — Other Ambulatory Visit: Payer: Self-pay

## 2018-04-16 DIAGNOSIS — R410 Disorientation, unspecified: Secondary | ICD-10-CM | POA: Diagnosis not present

## 2018-04-16 DIAGNOSIS — E519 Thiamine deficiency, unspecified: Secondary | ICD-10-CM | POA: Insufficient documentation

## 2018-04-16 NOTE — Progress Notes (Signed)
PATIENT: Debbie Gay DOB: Jul 10, 1970  Virtual Visit via Video  I connected with Debbie Gay on 04/16/18 at  by Video and verified that I am speaking with the correct person using two identifiers.   I discussed the limitations, risks, security and privacy concerns of performing an evaluation and management service by video and the availability of in person appointments. I also discussed with the patient that there may be a patient responsible charge related to this service. The patient expressed understanding and agreed to proceed.  HISTORICAL  Debbie Gay is a 48 year old female, seen in request by her primary care physician Dr.Garlick, Chrissie NoaWilliam for evaluation of altered mental status, initial evaluation was on December 13, 2017.  She is accompanied by her friend Derryl HarborLonnie at today's visit  I have reviewed and summarized the emergency room note on November 28, 2017-Dec 02 2017, she had a history of asthma, bipolar disorder, chronic pain, narcotic pain medications, anxiety, chronic abdominal pain, she lives with her grandmother, she was admitted to hospital for confusion, sitting in living room for hours, unsteady gait  She was diagnosed with acute metabolic encephalopathy due to thiamine deficiency, she was given IV thiamine with good result, now continue with p.o. thiamine 100 mg supplement,  I personally reviewed CT, and MRI of the brain, abnormal signal abnormality symmetric at bilateral medial and dorsal thalami,  CT angiogram of the head and neck showed no large vessel disease.  Laboratory evaluations in 2019 showed elevated valproic acid 126, mild elevated TSH 5.7, BMP, vitamin B1 was decreased 27, negative HIV, troponin  CSF studies, herpes PCR was negative, total protein was 38, glucose was 53, zero oligoclonal banding, WBC of 2, RBC of 1  UDS was positive for benzodiazepine, tricyclic  EEG on November 29, 2017 showed no significant abnormality.  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 I personally reviewed MRI of the brain in November 2019: Abnormal signal abnormality at bilateral medial and dorsal thalamus,  most consisten with her history of vitamint B1 deficiency, Wernicke's encephalopathy,  She has stressed looking, alert, was able to provide history, complete medication list, using both arms without difficulties, but has wide-based, mildly unsteady gait Assessment and Plan:   Acute onset of confusion unsteady gait in November 2019 Worsening memory loss, continue gait abnormality,  In the setting of significant GI symptoms, duodenal ulcer, constriction since October 2019 continue vitamin B1 supplement  Repeat MRI of the brain   Follow Up Instructions:  In 2 to 3 months    I discussed the assessment and treatment plan with the patient. The patient was provided an opportunity to ask questions and all were answered. The patient agreed with the plan and demonstrated an understanding of the instructions.   The patient was advised to  call back or seek an in-person evaluation if the symptoms worsen or if the condition fails to improve as anticipated.  I provided 30 minutes of  non-face-to-face time during this encounter.  REVIEW OF SYSTEMS: Full 14 system review of systems performed and notable only for as above All other review of systems were negative.  ALLERGIES: Allergies  Allergen Reactions  . Levaquin [Levofloxacin] Swelling  . Prednisone Shortness Of Breath and Swelling  . Penicillins     Has patient had a PCN reaction causing immediate rash, facial/tongue/throat swelling, SOB or lightheadedness with hypotension: Unknown Has patient had a PCN reaction causing severe rash involving mucus membranes or skin necrosis: Unknown Has patient had a PCN reaction that required hospitalization: Unknown Has patient had a PCN reaction occurring within the last 10 years: Unknown If all of the above answers are "NO", then may proceed with Cephalosporin use.    HOME MEDICATIONS: Current Outpatient Medications  Medication Sig Dispense Refill  . acetaminophen (TYLENOL) 325 MG tablet Take 650 mg by mouth daily as needed for mild pain or moderate pain.     Marland Kitchen albuterol (PROVENTIL HFA;VENTOLIN HFA) 108 (90 Base) MCG/ACT inhaler Inhale 2 puffs into the lungs every 6 (six) hours as needed for wheezing or shortness of breath.     . alprazolam (XANAX) 2 MG tablet Take 0.5 tablets (1 mg total) by mouth at bedtime. 30 tablet 0  . atorvastatin (LIPITOR) 20 MG tablet Take 20 mg by mouth daily.    . clarithromycin (BIAXIN) 500 MG tablet Take 1 tablet (500 mg total) by mouth every 12 (twelve) hours. (Patient not taking: Reported on 02/05/2018) 24 tablet 0  . diphenhydrAMINE-APAP, sleep, 38-500 MG TABS Take 1 tablet by mouth at bedtime as needed.    Marland Kitchen estrogens, conjugated, (PREMARIN) 1.25 MG tablet Take 1.25 mg by mouth daily.    Marland Kitchen KLOR-CON M10 10 MEQ tablet Take 20 mEq by mouth 2 (two) times daily.    . metroNIDAZOLE (FLAGYL) 500 MG tablet Take 1 tablet (500 mg total) by mouth every 8 (eight) hours. (Patient not taking: Reported on 02/05/2018) 36 tablet 0  . nitroGLYCERIN (NITROLINGUAL)  0.4 MG/SPRAY spray Place 1 spray under the tongue every 5 (five) minutes x 3 doses as needed for chest pain.    . Oxycodone HCl 10 MG TABS Take 20 mg by mouth every 4 (four) hours as needed.     . pantoprazole (PROTONIX) 40 MG tablet Take 1 tablet (40 mg total) by mouth daily. 30 tablet 1  . polyethylene glycol (MIRALAX / GLYCOLAX) packet Take 17 g by mouth daily.    . promethazine (PHENERGAN) 25 MG tablet Take 25 mg by mouth every 6 (six) hours as needed for nausea or vomiting.    Marland Kitchen QUEtiapine (SEROQUEL) 100 MG tablet Take 100-200 mg by mouth at bedtime as needed.     . sucralfate (CARAFATE) 1 g tablet Take 1 g by mouth 4 (four) times daily -  with meals and at bedtime.    . tamsulosin (FLOMAX) 0.4 MG CAPS capsule Take 1 capsule (0.4 mg total) by mouth daily. (Patient not taking: Reported on 02/05/2018) 30 capsule 0  . thiamine (VITAMIN B-1) 100 MG tablet Take 1 tablet (100 mg total) by mouth daily. 30 tablet 0  . tiotropium (SPIRIVA) 18 MCG inhalation capsule Place 18 mcg into inhaler and inhale daily as needed (for wheezing/shortness of breath).      No current facility-administered medications for this visit.  PAST MEDICAL HISTORY: Past Medical History:  Diagnosis Date  . Asthma   . Bipolar 1 disorder (HCC)   . Chronic pain   . COPD (chronic obstructive pulmonary disease) (HCC)   . Depression   . GERD (gastroesophageal reflux disease)   . History of heart attack   . IBS (irritable bowel syndrome)   . Myocardial infarction (HCC)   . Panic attacks     PAST SURGICAL HISTORY: Past Surgical History:  Procedure Laterality Date  . ABDOMINAL HYSTERECTOMY    . COLONOSCOPY WITH PROPOFOL N/A 10/30/2014   Procedure: COLONOSCOPY WITH PROPOFOL;  Surgeon: Christena Deem, MD;  Location: [facility] ENDOSCOPY;  Service: Endoscopy;  Laterality: N/A;  . COLONOSCOPY WITH PROPOFOL N/A 02/05/2018   Procedure: COLONOSCOPY WITH PROPOFOL;  Surgeon: Christena Deem, MD;  Location: [facility] ENDOSCOPY;   Service: Endoscopy;  Laterality: N/A;  . ESOPHAGOGASTRODUODENOSCOPY (EGD) WITH PROPOFOL N/A 10/30/2014   Procedure: ESOPHAGOGASTRODUODENOSCOPY (EGD) WITH PROPOFOL;  Surgeon: Christena Deem, MD;  Location: [facility] ENDOSCOPY;  Service: Endoscopy;  Laterality: N/A;  . ESOPHAGOGASTRODUODENOSCOPY (EGD) WITH PROPOFOL N/A 11/23/2017   Procedure: ESOPHAGOGASTRODUODENOSCOPY (EGD) WITH PROPOFOL;  Surgeon: Toledo, Boykin Nearing, MD;  Location: [facility] ENDOSCOPY;  Service: Gastroenterology;  Laterality: N/A;    FAMILY HISTORY: Family History  Problem Relation Age of Onset  . COPD Mother   . Irritable bowel syndrome Mother   . Glaucoma Father   . Hyperlipidemia Father     SOCIAL HISTORY:   Social History   Socioeconomic History  . Marital status: Divorced    Spouse name: Not on file  . Number of children: 3  . Years of education: GED/some college  . Highest education level: Not on file  Occupational History  . Occupation: Disabled  Social Needs  . Financial resource strain: Not on file  . Food insecurity:    Worry: Not on file    Inability: Not on file  . Transportation needs:    Medical: Not on file    Non-medical: Not on file  Tobacco Use  . Smoking status: Current Every Day Smoker    Packs/day: 0.50    Years: 20.00    Pack years: 10.00  . Smokeless tobacco: Never Used  Substance and Sexual Activity  . Alcohol use: No  . Drug use: No  . Sexual activity: Not Currently  Lifestyle  . Physical activity:    Days per week: Not on file    Minutes per session: Not on file  . Stress: Not on file  Relationships  . Social connections:    Talks on phone: Not on file    Gets together: Not on file    Attends religious service: Not on file    Active member of club or organization: Not on file    Attends meetings of clubs or organizations: Not on file    Relationship status: Not on file  . Intimate partner violence:    Fear of current or ex partner: Not on file    Emotionally abused: Not  on file    Physically abused: Not on file    Forced sexual activity: Not on file  Other Topics Concern  . Not on file  Social History Narrative   Right-handed.   Drinks four 16oz of [employer] per day.   Lives at home grandmother, uncle and daughter.    Levert Feinstein, M.D. Ph.D.  [facility] [address] [place], [place] [address] Ph: [phone] Fax: [phone]

## 2018-04-16 NOTE — Telephone Encounter (Signed)
medicaid order sent to GI. They will obtain the auth and reach out to the pt to schedule.  °

## 2018-04-19 ENCOUNTER — Encounter: Payer: Medicaid Other | Admitting: Neurology

## 2018-04-23 NOTE — Telephone Encounter (Signed)
Medicaid [employer] Harvey: [id number] (exp. 04/18/18 to 10/15/18) patient is scheduled at GI for 04/24/18

## 2018-04-24 ENCOUNTER — Other Ambulatory Visit: Payer: Self-pay

## 2018-04-24 ENCOUNTER — Ambulatory Visit
Admission: RE | Admit: 2018-04-24 | Discharge: 2018-04-24 | Disposition: A | Discharge status: Home / Self Care | Payer: Medicaid Other | Source: Ambulatory Visit | Attending: Neurology | Admitting: Neurology

## 2018-04-24 ENCOUNTER — Telehealth: Payer: Self-pay | Admitting: *Deleted

## 2018-04-24 ENCOUNTER — Other Ambulatory Visit (INDEPENDENT_AMBULATORY_CARE_PROVIDER_SITE_OTHER): Payer: Self-pay

## 2018-04-24 DIAGNOSIS — R410 Disorientation, unspecified: Secondary | ICD-10-CM

## 2018-04-24 DIAGNOSIS — Z0289 Encounter for other administrative examinations: Secondary | ICD-10-CM

## 2018-04-24 NOTE — Telephone Encounter (Signed)
Spoke to patient and she is aware of the MRI results below.

## 2018-04-24 NOTE — Telephone Encounter (Signed)
-----   Message from Levert Feinstein, MD sent at 04/24/2018  3:35 PM EDT ----- Please call pt for essentially normal MRI brain.

## 2018-04-26 LAB — VITAMIN B1: Thiamine: 231.8 nmol/L — ABNORMAL HIGH (ref 66.5–200.0)

## 2018-04-26 LAB — SEDIMENTATION RATE: Sed Rate: 15 mm/hr (ref 0–32)

## 2018-04-26 LAB — C-REACTIVE PROTEIN: CRP: 10 mg/L (ref 0–10)

## 2018-04-26 LAB — RPR: RPR Ser Ql: NONREACTIVE

## 2018-04-26 LAB — TSH: TSH: 1.98 u[IU]/mL (ref 0.450–4.500)

## 2018-04-26 LAB — VITAMIN B12: Vitamin B-12: >2000 pg/mL — ABNORMAL HIGH (ref 232–1245)

## 2018-04-29 ENCOUNTER — Telehealth: Payer: Self-pay | Admitting: *Deleted

## 2018-04-29 NOTE — Telephone Encounter (Signed)
-----   Message from Levert Feinstein, MD sent at 04/29/2018 12:31 PM EDT ----- Please call patient: Laboratory showed no significant abnormalities, the elevated b1 and b12 likely related to supplement

## 2018-04-29 NOTE — Telephone Encounter (Signed)
Spoke to patient to notify her of the lab results.  States she continues to feel swimmy headed along with a pressure/fullness sensation.  She is requesting to speak directly to Dr. Terrace Arabia concerning these results.  She has completed her MRI and labs.  Her NCV/EMG was canceled due to COVID-19 precautions and will be rescheduled.

## 2018-04-29 NOTE — Telephone Encounter (Signed)
I called the patient and scheduled a virtual visit on 04/30/2018 with Dr. Terrace Arabia.

## 2018-04-30 ENCOUNTER — Ambulatory Visit (INDEPENDENT_AMBULATORY_CARE_PROVIDER_SITE_OTHER): Payer: Medicaid Other | Admitting: Neurology

## 2018-04-30 ENCOUNTER — Encounter: Payer: Self-pay | Admitting: Neurology

## 2018-04-30 ENCOUNTER — Other Ambulatory Visit: Payer: Self-pay

## 2018-04-30 DIAGNOSIS — R51 Headache: Secondary | ICD-10-CM | POA: Diagnosis not present

## 2018-04-30 DIAGNOSIS — R112 Nausea with vomiting, unspecified: Secondary | ICD-10-CM | POA: Diagnosis not present

## 2018-04-30 DIAGNOSIS — R519 Headache, unspecified: Secondary | ICD-10-CM | POA: Insufficient documentation

## 2018-04-30 MED ORDER — NORTRIPTYLINE HCL 10 MG PO CAPS: 10.0000 mg | ORAL_CAPSULE | Freq: Every day | ORAL | 11 refills | Status: DC

## 2018-04-30 MED ORDER — SUMATRIPTAN SUCCINATE 25 MG PO TABS: 25.0000 mg | ORAL_TABLET | ORAL | 6 refills | Status: DC | PRN

## 2018-04-30 NOTE — Progress Notes (Signed)
PATIENT: Debbie Gay DOB: 30-Aug-1970  Virtual Visit via Video  I connected with Debbie Gay on 04/30/18 at  by Video and verified that I am speaking with the correct person using two identifiers.   I discussed the limitations, risks, security and privacy concerns of performing an evaluation and management service by video and the availability of in person appointments. I also discussed with the patient that there may be a patient responsible charge related to this service. The patient expressed understanding and agreed to proceed.  HISTORICAL  Debbie Gay is a 48 year old female, seen in request by her primary care physician Dr.Garlick, Debbie Gay for evaluation of altered mental status, initial evaluation was on December 13, 2017.  She is accompanied by her friend Debbie Gay at today's visit  I have reviewed and summarized the emergency room note on November 28, 2017-Dec 02 2017, she had a history of asthma, bipolar disorder, chronic pain, narcotic pain medications, anxiety, chronic abdominal pain, she lives with her grandmother, she was admitted to hospital for confusion, sitting in living room for hours, unsteady gait  She was diagnosed with acute metabolic encephalopathy due to thiamine deficiency, she was given IV thiamine with good result, now continue with p.o. thiamine 100 mg supplement,  I personally reviewed CT, and MRI of the brain, abnormal signal abnormality symmetric at bilateral medial and dorsal thalami,  CT angiogram of the head and neck showed no large vessel disease.  Laboratory evaluations in 2019 showed elevated valproic acid 126, mild elevated TSH 5.7, BMP, vitamin B1 was decreased 27, negative HIV, troponin  CSF studies, herpes PCR was negative, total protein was 38, glucose was 53, zero oligoclonal banding, WBC of 2, RBC of 1  UDS was positive for benzodiazepine, tricyclic  EEG on November 29, 2017 showed no significant abnormality.  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,  She contributed her vitamin B1 deficiency due to intractable nausea, now she had a significant improvement, but not back to baseline, complains of bilateral feet paresthesia generalized weakness, continue have unsteady gait,  UPDATE April 16 2018: She complains of poor appetite, keeps on loosing weight, also complains of worsening unsteady gait," do not feel right in my head", she has finger paresthesia,  She also complains of memory loss, taking oxycodone 10 mg 1 tab twice a day  Virtual Visit via Video  I connected with Debbie Gay on 04/30/18 at  by Video and verified that I am speaking with the correct person using two identifiers.   I discussed the limitations, risks, security and privacy concerns of performing an evaluation and management service by video and the availability of in person appointments. I also discussed with the patient that there may be a patient responsible charge related to this service. The patient expressed understanding and agreed to proceed.   History of Present Illness: She continues working with her GI physician, has better appetite now, but still has frequent nausea, is taking Zofran twice a day, also complains almost constant pressure headaches, had a history of chronic low back pain, taking oxycodone 10 mg 1 and half tablet twice a day,  I reviewed laboratory evaluation in April 2020, elevated B12, B1, likely due to supplement, otherwise normal ESR, C-reactive protein, negative RPR  I also personally reviewed MRI of  the brain on April 24, 2018, in comparison to previous MRI of the brain November 2019, this is essentially normal, previously medial dorsal thalamic hyperintensity signal changes has resolved.    Observations/Objective: I have reviewed problem lists, medications, allergies. Awake alert oriented to history taking care of conversation  Assessment and Plan: Acute onset of confusion unsteady gait in November 2019 Worsening memory loss, continue gait abnormality,  In the setting of duodenal ulcer, intractable nausea vomiting, poor p.o. intake, was diagnosed with acute metabolic encephalopathy due to vitamin B1 deficiency.    Some improvement with continued B1, B12 supplement   Repeat MRI of the brain in April 2020 showed significant improvement compared to previous scan in November 2019  Persistent headaches  History of migraine, headache or pressure, sometimes with nausea, has some migraine features,  Start on nortriptyline 10 mg every night as preventive medication  Imitrex 25 mg as needed  Follow Up Instructions:  58-month   I discussed the assessment and treatment plan with the patient. The patient was provided an opportunity to ask questions and all were answered. The patient agreed with the plan and demonstrated an understanding of the instructions.   The patient was advised to call back or seek an in-person evaluation if the symptoms worsen or if the condition fails to improve as anticipated.  I provided 25 minutes of non-face-to-face time during this encounter.   YMarcial Pacas MD

## 2018-06-10 ENCOUNTER — Telehealth: Payer: Self-pay

## 2018-06-10 NOTE — Telephone Encounter (Signed)
I tried contacting patient about rescheduling her NCV/EMG with Dr. Krista Blue, but pt did not answer and her VMbox was full so I could not leave a message. If patient calls back, please schedule her for a NCV/EMG with Dr. Krista Blue.

## 2018-08-12 ENCOUNTER — Ambulatory Visit: Payer: Medicaid Other | Admitting: Neurology

## 2018-12-09 ENCOUNTER — Other Ambulatory Visit: Payer: Self-pay | Admitting: Neurology

## 2018-12-09 DIAGNOSIS — R269 Unspecified abnormalities of gait and mobility: Secondary | ICD-10-CM

## 2018-12-09 DIAGNOSIS — R202 Paresthesia of skin: Secondary | ICD-10-CM

## 2019-02-19 IMAGING — CT CT ABD-PELV W/ CM
2 of 5 series · 16 of 46 positions shown, 18 images · IV contrast (iopamidol)
Comparison: 11/16/2014

CLINICAL DATA: Right lower quadrant abdominal pain for 3 weeks.
Constipation.

EXAM:
CT ABDOMEN AND PELVIS WITH CONTRAST
TECHNIQUE: Multidetector CT imaging of the abdomen and pelvis was performed
using the standard protocol following bolus administration of
intravenous contrast.
CONTRAST:  80mL [id number] IOPAMIDOL ([id number]) INJECTION 61%

[Series 2: abd pelvis · axial · 0.67mm/px · z∈[-1584,-1204]mm · 13 of 86 slices shown, 15 images (1 of 2)]
[im 5/86  soft-tissue]
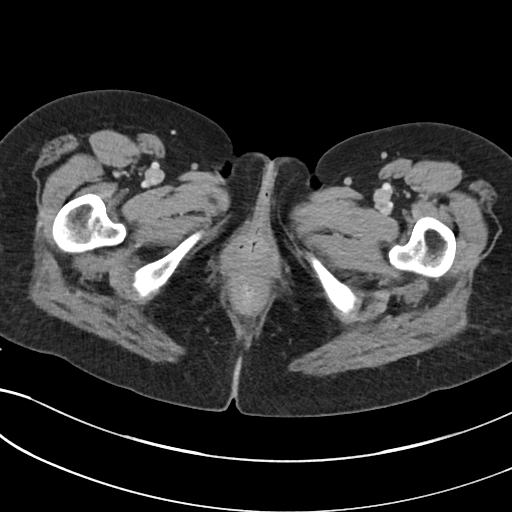
[im 5/86  bone]
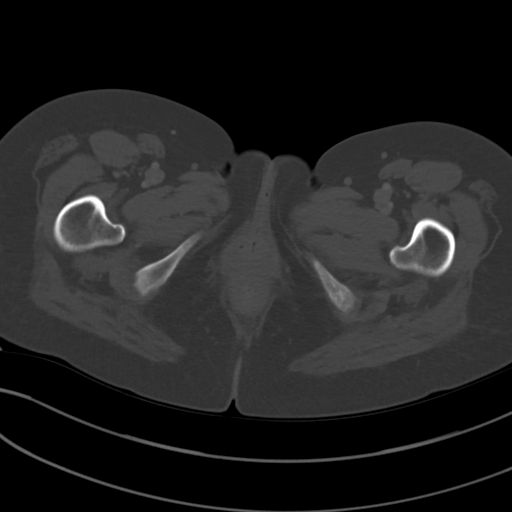
[im 10/86  soft-tissue]
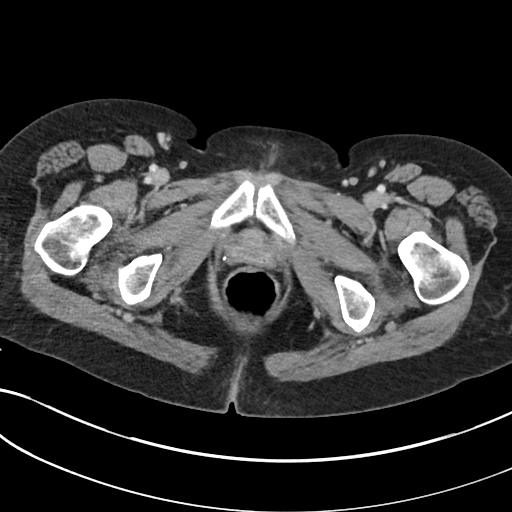
[im 19/86  soft-tissue]
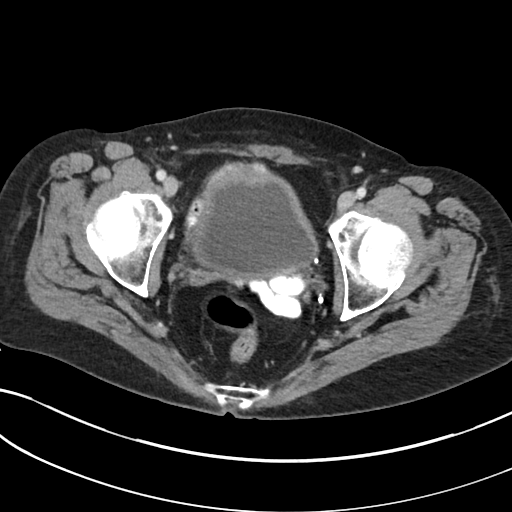
[im 24/86  soft-tissue]
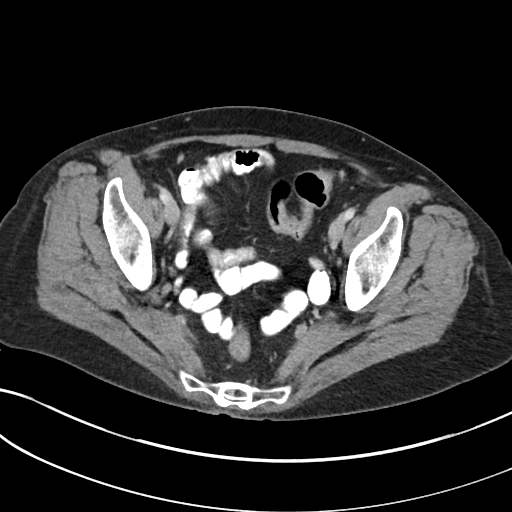
[im 29/86  soft-tissue]
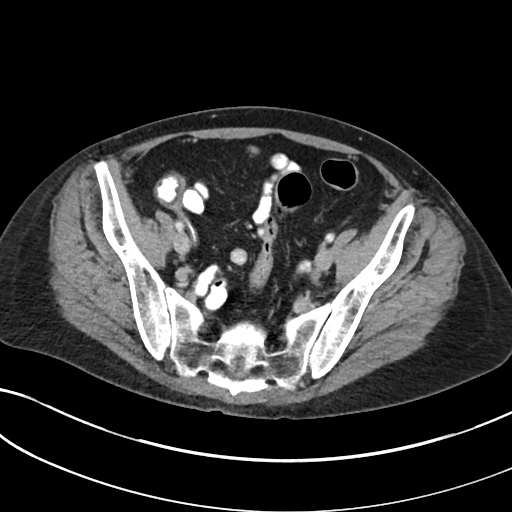
[im 38/86  soft-tissue]
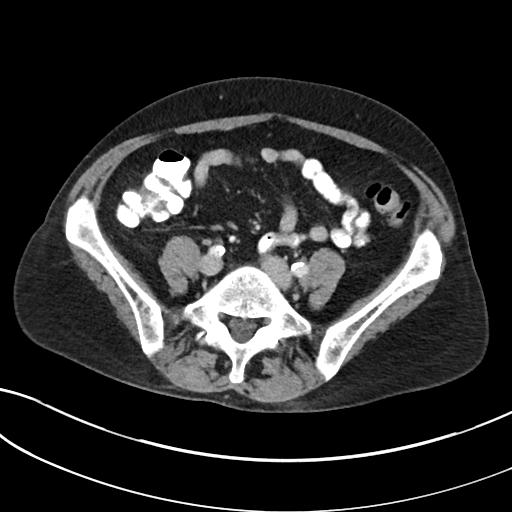
[im 43/86  soft-tissue]
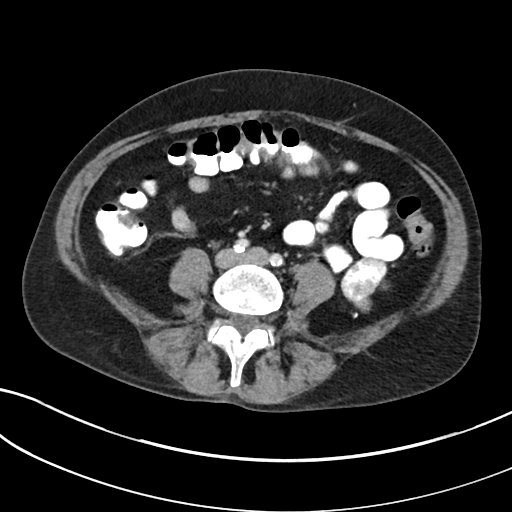
[im 48/86  soft-tissue]
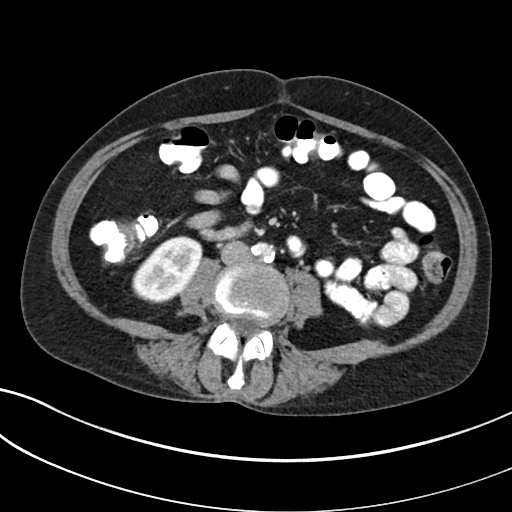
[im 57/86  soft-tissue]
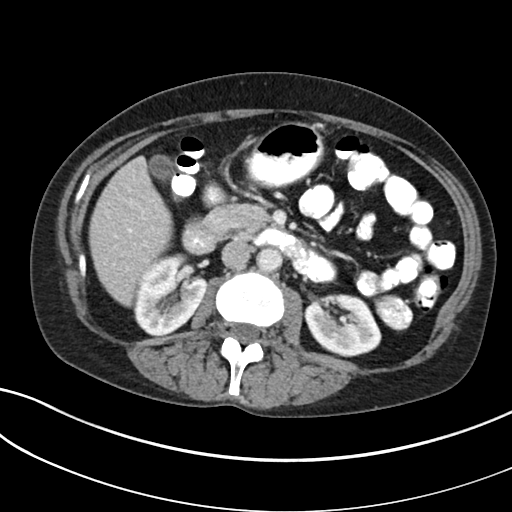
[im 57/86  bone]
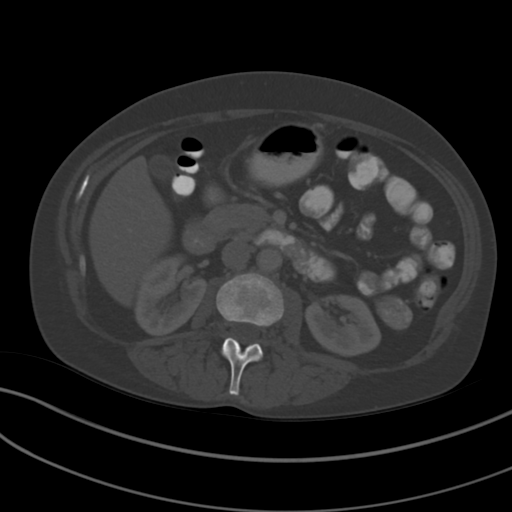
[im 62/86  soft-tissue]
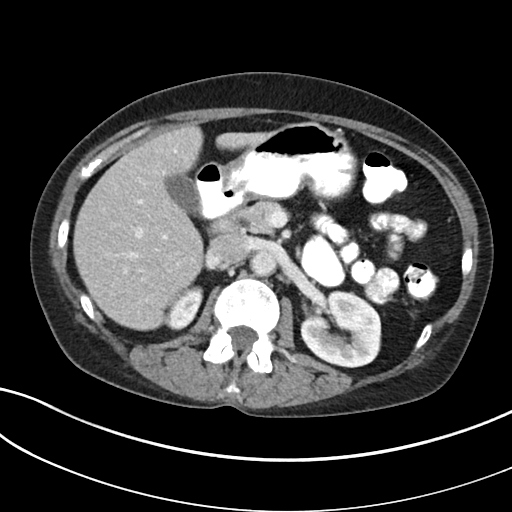
[im 67/86  soft-tissue]
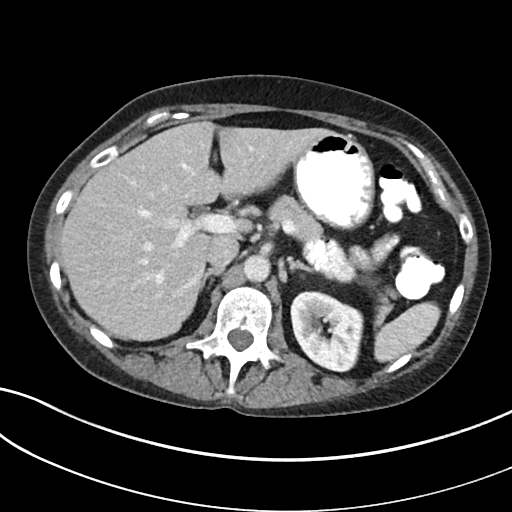
[im 76/86  soft-tissue]
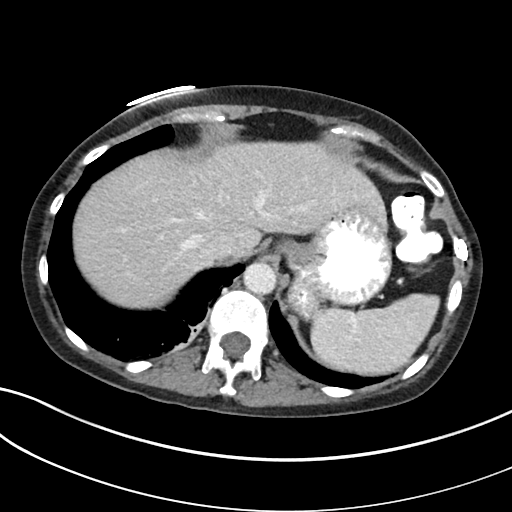
[im 81/86  soft-tissue]
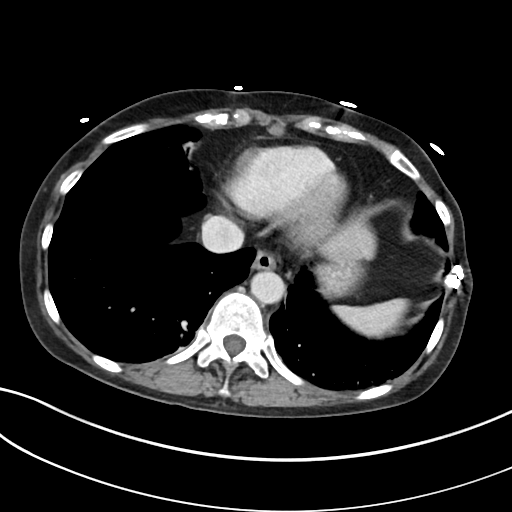

[Series 4: abd pelvis · coronal · 0.67mm/px · 3 of 128 slices shown (2 of 2)]
[im 43/128  soft-tissue]
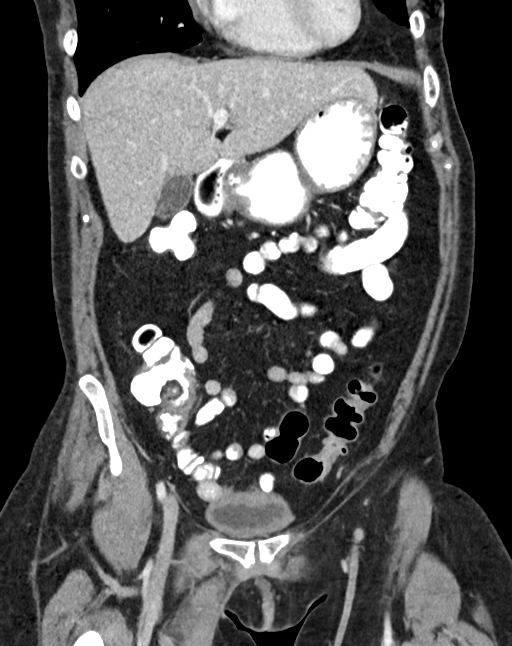
[im 57/128  soft-tissue]
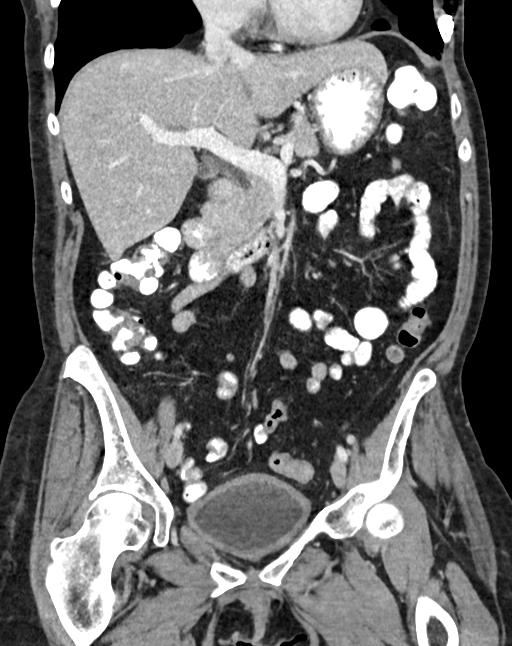
[im 71/128  soft-tissue]
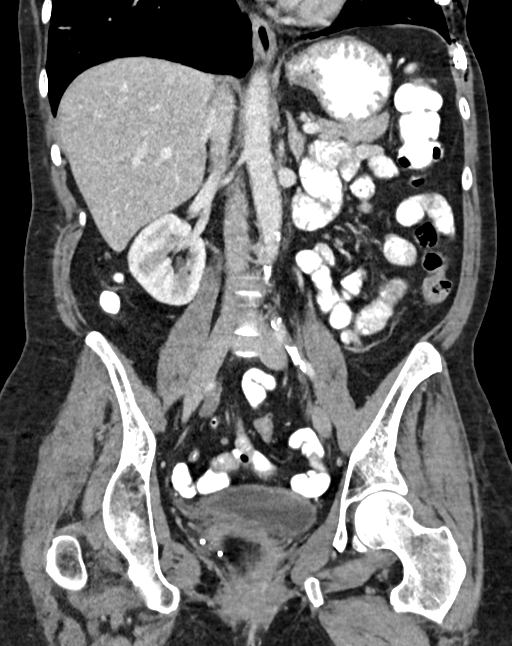

[16 of 46 positions shown; findings below may reference images not displayed]

FINDINGS: Lower chest: Scarring or atelectasis in both lower lobes and in the
right middle lobe. Plates are observed along the left lower ribs.

Hepatobiliary: Mildly contracted gallbladder. The liver appears
unremarkable. Borderline prominence of the common bile duct, but
improved from prior.

Pancreas: Unremarkable

Spleen: Unremarkable

Adrenals/Urinary Tract: Mild thickening of the urinary bladder wall
diffusely. At least partially duplicated left renal collecting
system. Adrenal glands normal.

Stomach/Bowel: Posterior gastric diverticulum. There is a metal clip
adjacent to the rectum on image 65/2, as on prior exams. Appendix
normal.

Vascular/Lymphatic: Aortoiliac atherosclerotic vascular disease.

Reproductive: Uterus absent.  Adnexa unremarkable.

Other: No supplemental non-categorized findings.

Musculoskeletal: Unremarkable
IMPRESSION: 1. Diffuse urinary bladder wall thickening raising the possibility
of cystitis.
2. The appendix appears normal and a separate specific cause for
right lower quadrant abdominal pain is not identified.
3. Duplicated or partially duplicated left renal collecting system.
4.  Aortoiliac atherosclerotic vascular disease.
5. Scarring or atelectasis at the lung bases.
6. Left lower rib plates.

## 2019-03-04 ENCOUNTER — Other Ambulatory Visit: Payer: Self-pay

## 2019-03-04 ENCOUNTER — Inpatient Hospital Stay
Admission: EM | Admit: 2019-03-04 | Discharge: 2019-03-05 | DRG: 312 | Discharge status: Against Medical Advice | Payer: Medicaid Other | Attending: Internal Medicine | Admitting: Internal Medicine

## 2019-03-04 ENCOUNTER — Emergency Department: Payer: Medicaid Other

## 2019-03-04 ENCOUNTER — Encounter: Payer: Self-pay | Admitting: Emergency Medicine

## 2019-03-04 DIAGNOSIS — E519 Thiamine deficiency, unspecified: Secondary | ICD-10-CM | POA: Diagnosis present

## 2019-03-04 DIAGNOSIS — Z888 Allergy status to other drugs, medicaments and biological substances status: Secondary | ICD-10-CM

## 2019-03-04 DIAGNOSIS — Y900 Blood alcohol level of less than 20 mg/100 ml: Secondary | ICD-10-CM | POA: Diagnosis present

## 2019-03-04 DIAGNOSIS — F32A Depression, unspecified: Secondary | ICD-10-CM | POA: Diagnosis present

## 2019-03-04 DIAGNOSIS — I252 Old myocardial infarction: Secondary | ICD-10-CM

## 2019-03-04 DIAGNOSIS — F419 Anxiety disorder, unspecified: Secondary | ICD-10-CM | POA: Diagnosis not present

## 2019-03-04 DIAGNOSIS — Z20822 Contact with and (suspected) exposure to covid-19: Secondary | ICD-10-CM | POA: Diagnosis present

## 2019-03-04 DIAGNOSIS — Z79891 Long term (current) use of opiate analgesic: Secondary | ICD-10-CM

## 2019-03-04 DIAGNOSIS — F410 Panic disorder [episodic paroxysmal anxiety] without agoraphobia: Secondary | ICD-10-CM | POA: Diagnosis present

## 2019-03-04 DIAGNOSIS — Z720 Tobacco use: Secondary | ICD-10-CM | POA: Diagnosis present

## 2019-03-04 DIAGNOSIS — F329 Major depressive disorder, single episode, unspecified: Secondary | ICD-10-CM | POA: Diagnosis present

## 2019-03-04 DIAGNOSIS — Z825 Family history of asthma and other chronic lower respiratory diseases: Secondary | ICD-10-CM

## 2019-03-04 DIAGNOSIS — R4189 Other symptoms and signs involving cognitive functions and awareness: Secondary | ICD-10-CM | POA: Insufficient documentation

## 2019-03-04 DIAGNOSIS — K2100 Gastro-esophageal reflux disease with esophagitis, without bleeding: Secondary | ICD-10-CM | POA: Diagnosis present

## 2019-03-04 DIAGNOSIS — E785 Hyperlipidemia, unspecified: Secondary | ICD-10-CM | POA: Diagnosis present

## 2019-03-04 DIAGNOSIS — R6510 Systemic inflammatory response syndrome (SIRS) of non-infectious origin without acute organ dysfunction: Secondary | ICD-10-CM | POA: Diagnosis present

## 2019-03-04 DIAGNOSIS — J9601 Acute respiratory failure with hypoxia: Secondary | ICD-10-CM | POA: Diagnosis present

## 2019-03-04 DIAGNOSIS — F319 Bipolar disorder, unspecified: Secondary | ICD-10-CM | POA: Diagnosis present

## 2019-03-04 DIAGNOSIS — Z79899 Other long term (current) drug therapy: Secondary | ICD-10-CM

## 2019-03-04 DIAGNOSIS — Z881 Allergy status to other antibiotic agents status: Secondary | ICD-10-CM

## 2019-03-04 DIAGNOSIS — K589 Irritable bowel syndrome without diarrhea: Secondary | ICD-10-CM | POA: Diagnosis present

## 2019-03-04 DIAGNOSIS — Z880 Allergy status to penicillin: Secondary | ICD-10-CM

## 2019-03-04 DIAGNOSIS — Z90710 Acquired absence of both cervix and uterus: Secondary | ICD-10-CM

## 2019-03-04 DIAGNOSIS — F17200 Nicotine dependence, unspecified, uncomplicated: Secondary | ICD-10-CM | POA: Diagnosis present

## 2019-03-04 DIAGNOSIS — E876 Hypokalemia: Secondary | ICD-10-CM | POA: Diagnosis present

## 2019-03-04 DIAGNOSIS — Z8349 Family history of other endocrine, nutritional and metabolic diseases: Secondary | ICD-10-CM

## 2019-03-04 DIAGNOSIS — J449 Chronic obstructive pulmonary disease, unspecified: Secondary | ICD-10-CM | POA: Diagnosis present

## 2019-03-04 DIAGNOSIS — I959 Hypotension, unspecified: Secondary | ICD-10-CM | POA: Diagnosis present

## 2019-03-04 DIAGNOSIS — F17210 Nicotine dependence, cigarettes, uncomplicated: Secondary | ICD-10-CM | POA: Diagnosis present

## 2019-03-04 DIAGNOSIS — K219 Gastro-esophageal reflux disease without esophagitis: Secondary | ICD-10-CM | POA: Diagnosis present

## 2019-03-04 DIAGNOSIS — R55 Syncope and collapse: Secondary | ICD-10-CM | POA: Diagnosis not present

## 2019-03-04 DIAGNOSIS — D72829 Elevated white blood cell count, unspecified: Secondary | ICD-10-CM | POA: Diagnosis present

## 2019-03-04 DIAGNOSIS — I2510 Atherosclerotic heart disease of native coronary artery without angina pectoris: Secondary | ICD-10-CM | POA: Diagnosis present

## 2019-03-04 DIAGNOSIS — G8929 Other chronic pain: Secondary | ICD-10-CM | POA: Diagnosis present

## 2019-03-04 DIAGNOSIS — R739 Hyperglycemia, unspecified: Secondary | ICD-10-CM | POA: Diagnosis present

## 2019-03-04 LAB — URINALYSIS, COMPLETE (UACMP) WITH MICROSCOPIC
Bacteria, UA: NONE SEEN
Bilirubin Urine: NEGATIVE
Glucose, UA: >=500 mg/dL — AB
Ketones, ur: NEGATIVE mg/dL
Leukocytes,Ua: NEGATIVE
Nitrite: NEGATIVE
Protein, ur: NEGATIVE mg/dL
Specific Gravity, Urine: 1.039 — ABNORMAL HIGH (ref 1.005–1.030)
pH: 5 (ref 5.0–8.0)

## 2019-03-04 LAB — COMPREHENSIVE METABOLIC PANEL
ALT: 37 U/L (ref 0–44)
AST: 60 U/L — ABNORMAL HIGH (ref 15–41)
Albumin: 3.8 g/dL (ref 3.5–5.0)
Alkaline Phosphatase: 86 U/L (ref 38–126)
Anion gap: 12 (ref 5–15)
BUN: 17 mg/dL (ref 6–20)
CO2: 29 mmol/L (ref 22–32)
Calcium: 8.7 mg/dL — ABNORMAL LOW (ref 8.9–10.3)
Chloride: 103 mmol/L (ref 98–111)
Creatinine, Ser: 0.85 mg/dL (ref 0.44–1.00)
GFR calc Af Amer: >60 mL/min (ref >60)
GFR calc non Af Amer: >60 mL/min (ref >60)
Glucose, Bld: 178 mg/dL — ABNORMAL HIGH (ref 70–99)
Potassium: 3.1 mmol/L — ABNORMAL LOW (ref 3.5–5.1)
Sodium: 144 mmol/L (ref 135–145)
Total Bilirubin: 0.8 mg/dL (ref 0.3–1.2)
Total Protein: 6.9 g/dL (ref 6.5–8.1)

## 2019-03-04 LAB — BLOOD GAS, VENOUS
Acid-Base Excess: 4 mmol/L — ABNORMAL HIGH (ref 0.0–2.0)
Bicarbonate: 31.6 mmol/L — ABNORMAL HIGH (ref 20.0–28.0)
O2 Saturation: 85.8 %
Patient temperature: 37
pCO2, Ven: 60 mmHg (ref 44.0–60.0)
pH, Ven: 7.33 (ref 7.250–7.430)
pO2, Ven: 55 mmHg — ABNORMAL HIGH (ref 32.0–45.0)

## 2019-03-04 LAB — GLUCOSE, CAPILLARY
Glucose-Capillary: 113 mg/dL — ABNORMAL HIGH (ref 70–99)
Glucose-Capillary: 132 mg/dL — ABNORMAL HIGH (ref 70–99)
Glucose-Capillary: 205 mg/dL — ABNORMAL HIGH (ref 70–99)
Glucose-Capillary: 68 mg/dL — ABNORMAL LOW (ref 70–99)

## 2019-03-04 LAB — CBC WITH DIFFERENTIAL/PLATELET
Abs Immature Granulocytes: 0.15 10*3/uL — ABNORMAL HIGH (ref 0.00–0.07)
Basophils Absolute: 0.1 10*3/uL (ref 0.0–0.1)
Basophils Relative: 0 %
Eosinophils Absolute: 0 10*3/uL (ref 0.0–0.5)
Eosinophils Relative: 0 %
HCT: 44.8 % (ref 36.0–46.0)
Hemoglobin: 14.3 g/dL (ref 12.0–15.0)
Immature Granulocytes: 1 %
Lymphocytes Relative: 4 %
Lymphs Abs: 0.7 10*3/uL (ref 0.7–4.0)
MCH: 31.4 pg (ref 26.0–34.0)
MCHC: 31.9 g/dL (ref 30.0–36.0)
MCV: 98.5 fL (ref 80.0–100.0)
Monocytes Absolute: 0.8 10*3/uL (ref 0.1–1.0)
Monocytes Relative: 4 %
Neutro Abs: 16.1 10*3/uL — ABNORMAL HIGH (ref 1.7–7.7)
Neutrophils Relative %: 91 %
Platelets: 243 10*3/uL (ref 150–400)
RBC: 4.55 MIL/uL (ref 3.87–5.11)
RDW: 13.3 % (ref 11.5–15.5)
WBC: 17.8 10*3/uL — ABNORMAL HIGH (ref 4.0–10.5)
nRBC: 0 % (ref 0.0–0.2)

## 2019-03-04 LAB — SARS CORONAVIRUS 2 (TAT 6-24 HRS): SARS Coronavirus 2: NEGATIVE

## 2019-03-04 LAB — HEMOGLOBIN A1C
Hgb A1c MFr Bld: 6.1 % — ABNORMAL HIGH (ref 4.8–5.6)
Mean Plasma Glucose: 128.37 mg/dL

## 2019-03-04 LAB — URINE DRUG SCREEN, QUALITATIVE (ARMC ONLY)
Amphetamines, Ur Screen: NOT DETECTED
Barbiturates, Ur Screen: NOT DETECTED
Benzodiazepine, Ur Scrn: POSITIVE — AB
Cannabinoid 50 Ng, Ur ~~LOC~~: NOT DETECTED
Cocaine Metabolite,Ur ~~LOC~~: NOT DETECTED
MDMA (Ecstasy)Ur Screen: NOT DETECTED
Methadone Scn, Ur: NOT DETECTED
Opiate, Ur Screen: POSITIVE — AB
Phencyclidine (PCP) Ur S: NOT DETECTED
Tricyclic, Ur Screen: NOT DETECTED

## 2019-03-04 LAB — BRAIN NATRIURETIC PEPTIDE: B Natriuretic Peptide: 105 pg/mL — ABNORMAL HIGH (ref 0.0–100.0)

## 2019-03-04 LAB — PROCALCITONIN: Procalcitonin: <0.1 ng/mL

## 2019-03-04 LAB — HIV ANTIBODY (ROUTINE TESTING W REFLEX): HIV Screen 4th Generation wRfx: NONREACTIVE

## 2019-03-04 LAB — TROPONIN I (HIGH SENSITIVITY): Troponin I (High Sensitivity): 13 ng/L (ref <18)

## 2019-03-04 LAB — LACTIC ACID, PLASMA
Lactic Acid, Venous: 0.8 mmol/L (ref 0.5–1.9)
Lactic Acid, Venous: 1.1 mmol/L (ref 0.5–1.9)

## 2019-03-04 LAB — SALICYLATE LEVEL: Salicylate Lvl: <7 mg/dL — ABNORMAL LOW (ref 7.0–30.0)

## 2019-03-04 LAB — ETHANOL: Alcohol, Ethyl (B): <10 mg/dL (ref <10)

## 2019-03-04 LAB — MRSA PCR SCREENING: MRSA by PCR: NEGATIVE

## 2019-03-04 LAB — ACETAMINOPHEN LEVEL: Acetaminophen (Tylenol), Serum: <10 ug/mL — ABNORMAL LOW (ref 10–30)

## 2019-03-04 MED ORDER — ENOXAPARIN SODIUM 40 MG/0.4ML ~~LOC~~ SOLN
40.0000 mg | SUBCUTANEOUS | Status: DC
  Administered 2019-03-04: 40 mg via SUBCUTANEOUS
  Filled 2019-03-04: qty 0.4

## 2019-03-04 MED ORDER — NICOTINE 21 MG/24HR TD PT24
21.0000 mg | MEDICATED_PATCH | Freq: Every day | TRANSDERMAL | Status: DC
  Administered 2019-03-04 – 2019-03-05 (×2): 21 mg via TRANSDERMAL
  Filled 2019-03-04 (×2): qty 1

## 2019-03-04 MED ORDER — SODIUM CHLORIDE 0.9 % IV BOLUS
1000.0000 mL | Freq: Once | INTRAVENOUS | Status: AC
  Administered 2019-03-04: 1000 mL via INTRAVENOUS

## 2019-03-04 MED ORDER — ACETAMINOPHEN 325 MG PO TABS: 650.0000 mg | ORAL_TABLET | Freq: Four times a day (QID) | ORAL | Status: DC | PRN

## 2019-03-04 MED ORDER — IOHEXOL 350 MG/ML SOLN
75.0000 mL | Freq: Once | INTRAVENOUS | Status: AC | PRN
  Administered 2019-03-04: 75 mL via INTRAVENOUS

## 2019-03-04 MED ORDER — OMEGA-3-ACID ETHYL ESTERS 1 G PO CAPS
1.0000 g | ORAL_CAPSULE | Freq: Every day | ORAL | Status: DC
  Administered 2019-03-04 – 2019-03-05 (×2): 1 g via ORAL
  Filled 2019-03-04 (×2): qty 1

## 2019-03-04 MED ORDER — ONDANSETRON HCL 4 MG/2ML IJ SOLN
4.0000 mg | Freq: Three times a day (TID) | INTRAMUSCULAR | Status: DC | PRN
  Administered 2019-03-04 – 2019-03-05 (×3): 4 mg via INTRAVENOUS
  Filled 2019-03-04 (×3): qty 2

## 2019-03-04 MED ORDER — IPRATROPIUM BROMIDE 0.02 % IN SOLN
2.5000 mL | RESPIRATORY_TRACT | Status: DC
  Filled 2019-03-04: qty 2.5

## 2019-03-04 MED ORDER — ALBUTEROL SULFATE (2.5 MG/3ML) 0.083% IN NEBU: 2.5000 mg | INHALATION_SOLUTION | RESPIRATORY_TRACT | Status: DC | PRN

## 2019-03-04 MED ORDER — NALOXONE HCL 0.4 MG/ML IJ SOLN
0.4000 mg | INTRAMUSCULAR | Status: AC
  Administered 2019-03-04: 0.4 mg via INTRAVENOUS
  Filled 2019-03-04: qty 1

## 2019-03-04 MED ORDER — FLUMAZENIL 1 MG/10ML IV SOLN
0.2000 mg | Freq: Once | INTRAVENOUS | Status: DC
  Filled 2019-03-04: qty 2

## 2019-03-04 MED ORDER — PANTOPRAZOLE SODIUM 40 MG PO TBEC
40.0000 mg | DELAYED_RELEASE_TABLET | Freq: Two times a day (BID) | ORAL | Status: DC
  Administered 2019-03-04 – 2019-03-05 (×3): 40 mg via ORAL
  Filled 2019-03-04 (×3): qty 1

## 2019-03-04 MED ORDER — DM-GUAIFENESIN ER 30-600 MG PO TB12
1.0000 | ORAL_TABLET | Freq: Two times a day (BID) | ORAL | Status: DC
  Administered 2019-03-04 – 2019-03-05 (×2): 1 via ORAL
  Filled 2019-03-04 (×2): qty 1

## 2019-03-04 MED ORDER — VITAMIN B-12 1000 MCG PO TABS
1000.0000 ug | ORAL_TABLET | Freq: Every day | ORAL | Status: DC
  Administered 2019-03-04 – 2019-03-05 (×2): 1000 ug via ORAL
  Filled 2019-03-04 (×2): qty 1

## 2019-03-04 MED ORDER — THIAMINE HCL 100 MG PO TABS
100.0000 mg | ORAL_TABLET | Freq: Every day | ORAL | Status: DC
  Administered 2019-03-04 – 2019-03-05 (×2): 100 mg via ORAL
  Filled 2019-03-04 (×2): qty 1

## 2019-03-04 MED ORDER — SODIUM CHLORIDE 0.9 % IV SOLN: INTRAVENOUS | Status: DC

## 2019-03-04 MED ORDER — POTASSIUM CHLORIDE CRYS ER 20 MEQ PO TBCR
40.0000 meq | EXTENDED_RELEASE_TABLET | Freq: Once | ORAL | Status: DC
  Filled 2019-03-04: qty 2

## 2019-03-04 MED ORDER — CHLORHEXIDINE GLUCONATE CLOTH 2 % EX PADS
6.0000 | MEDICATED_PAD | Freq: Every day | CUTANEOUS | Status: DC
  Administered 2019-03-04 – 2019-03-05 (×2): 6 via TOPICAL

## 2019-03-04 MED ORDER — DEXTROSE 50 % IV SOLN
25.0000 mL | Freq: Once | INTRAVENOUS | Status: AC
  Administered 2019-03-04: 25 mL via INTRAVENOUS
  Filled 2019-03-04: qty 50

## 2019-03-04 NOTE — ED Notes (Signed)
Pt 82% on room air; pt sleeping on assessment, this RN woke patient, repositioned her but without any positive response to oxygen saturation.  Pt placed on 2L nasal cannula at this time to maintain oxygen saturation >90%.  EDP, [facility] aware.

## 2019-03-04 NOTE — Progress Notes (Signed)
NT called out for pt possibly not breathing. Pt found to be grayish in color and not responding to voice. This RT did deep sternal rub on patient, Patient responded. Spo2 40's on room air, placed pt on NRB. SPO2 99%, RR 20. RN's at bedside. FIO2 eventually decreased to .28. SPO2 98%

## 2019-03-04 NOTE — H&P (Signed)
History and Physical    Debbie Gay WJX:[id number] DOB: [birth year] DOA: 03/04/2019  Referring MD/NP/PA:   PCP: [name], MD   Patient coming from:  The patient is coming from home.  At baseline, pt is independent for most of ADL.        Chief Complaint: Syncope  HPI: Debbie Gay is a 49 y.o. female with medical history significant of hyperlipidemia, COPD, asthma, GERD, depression, anxiety, CAD, myocardial infarction, panic attack, bipolar disorder, IBS, tobacco abuse, vitamin B1 deficiency, who presents with syncope.  Per her fianc, he called pt at about 4:30 AM, but no response, then he went to check on her, found that she probably passed out and was difficult waking up. He noticed she was diaphoretic and felt like her breathing was shallow. Checked her pulses, which she did have. He called 911 who instructed him to do chest compression. He did this for about 3 minutes and pt awoke shortly thereafter prior to EMS arrival. EMS reported hyperglycemia into the 449s. Her mental status gradually improved.  When I saw patient in ED, she is drowsy, but orientated x3.  She has answered all questions appropriately.  Patient does not have unilateral weakness, numbness or tingling his extremities.  No facial droop or slurred speech.  She states that she had  chest discomfort earlier, which has resolved.  Currently patient does not have chest pain.  She has some mild cough, mild shortness of breath.  No fever or chills.  She has nausea, but no vomiting, diarrhea, abdominal pain, symptoms of UTI.  Of note, patient states that she is unsure if she may have mixed up her nighttime medications or taken too many medications. She recently started Seroquel, which she started 2 nights ago. She is also on oxycodone, Xanax, nortriptyline and Reglan at home.   ED Course: pt was found to have WBC 17.8, troponin 13, BNP 105, UDS positive for benzo and opiate, Tylenol level less than 10, salicylate level less  than 7, alcohol level less than 10, pending COVID-19 PCR, negative urinalysis, potassium 3.1, renal function okay, temperature normal, oxygen saturation 86% on room air, RR 16.  VBG showed pH 7.33 CO2 60, O2 55. CT head is negative for acute intracranial abnormalities.  CT angiogram is negative for PE.  Patient is placed on MedSurg bed for observation  Review of Systems:   General: no fevers, chills, no body weight gain, has fatigue HEENT: no blurry vision, hearing changes or sore throat Respiratory: has dyspnea, coughing, no wheezing CV: no chest pain, no palpitations GI: has nausea, no vomiting, abdominal pain, diarrhea, constipation GU: no dysuria, burning on urination, increased urinary frequency, hematuria  Ext: no leg edema Neuro: no unilateral weakness, numbness, or tingling, no vision change or hearing loss. Possible syncope. Skin: no rash, no skin tear. MSK: No muscle spasm, no deformity, no limitation of range of movement in spin Heme: No easy bruising.  Travel history: No recent long distant travel.  Allergy:  Allergies  Allergen Reactions  . Levaquin [Levofloxacin] Swelling  . Prednisone Shortness Of Breath and Swelling  . Penicillins     Has patient had a PCN reaction causing immediate rash, facial/tongue/throat swelling, SOB or lightheadedness with hypotension: Unknown Has patient had a PCN reaction causing severe rash involving mucus membranes or skin necrosis: Unknown Has patient had a PCN reaction that required hospitalization: Unknown Has patient had a PCN reaction occurring within the last 10 years: Unknown If all of the above answers  are "NO", then may proceed with Cephalosporin use.    Past Medical History:  Diagnosis Date  . Asthma   . Bipolar 1 disorder ([place])   . Chronic pain   . COPD (chronic obstructive pulmonary disease) ([place])   . Depression   . GERD (gastroesophageal reflux disease)   . History of heart attack   . IBS (irritable bowel syndrome)   .  Myocardial infarction ([place])   . Panic attacks     Past Surgical History:  Procedure Laterality Date  . ABDOMINAL HYSTERECTOMY    . COLONOSCOPY WITH PROPOFOL N/A 10/30/2014   Procedure: COLONOSCOPY WITH PROPOFOL;  Surgeon: Lollie Sails, MD;  Location: [facility] ENDOSCOPY;  Service: Endoscopy;  Laterality: N/A;  . COLONOSCOPY WITH PROPOFOL N/A 02/05/2018   Procedure: COLONOSCOPY WITH PROPOFOL;  Surgeon: Lollie Sails, MD;  Location: [facility] ENDOSCOPY;  Service: Endoscopy;  Laterality: N/A;  . ESOPHAGOGASTRODUODENOSCOPY (EGD) WITH PROPOFOL N/A 10/30/2014   Procedure: ESOPHAGOGASTRODUODENOSCOPY (EGD) WITH PROPOFOL;  Surgeon: Lollie Sails, MD;  Location: [facility] ENDOSCOPY;  Service: Endoscopy;  Laterality: N/A;  . ESOPHAGOGASTRODUODENOSCOPY (EGD) WITH PROPOFOL N/A 11/23/2017   Procedure: ESOPHAGOGASTRODUODENOSCOPY (EGD) WITH PROPOFOL;  Surgeon: Toledo, Benay Pike, MD;  Location: [facility] ENDOSCOPY;  Service: Gastroenterology;  Laterality: N/A;    Social History:  reports that she has been smoking. She has a 10.00 pack-year smoking history. She has never used smokeless tobacco. She reports that she does not drink alcohol or use drugs.  Family History:  Family History  Problem Relation Age of Onset  . COPD Mother   . Irritable bowel syndrome Mother   . Glaucoma Father   . Hyperlipidemia Father      Prior to Admission medications   Medication Sig Start Date End Date Taking? Authorizing Provider  alprazolam Duanne Moron) 2 MG tablet Take 1 mg by mouth at bedtime.    Yes [provider]  Cyanocobalamin (VITAMIN B-12) 5000 MCG TBDP Take 1,000 mcg by mouth daily.   Yes [provider]  metoCLOPramide (REGLAN) 10 MG tablet Take 10 mg by mouth in the morning and at bedtime.   Yes [provider]  nortriptyline (PAMELOR) 10 MG capsule Take 1 capsule (10 mg total) by mouth at bedtime. 04/30/18  Yes Marcial Pacas, MD  Omega-3 Fatty Acids (FISH OIL) 1000 MG CAPS Take 1,000 mg by mouth  daily.   Yes [provider]  omeprazole (PRILOSEC) 40 MG capsule Take 40 mg by mouth daily.   Yes [provider]  Oxycodone HCl 10 MG TABS Take 15 mg by mouth 3 (three) times daily.    Yes [provider]  pantoprazole (PROTONIX) 40 MG tablet Take 1 tablet (40 mg total) by mouth daily. Patient taking differently: Take 40 mg by mouth 2 (two) times daily.  11/23/17 03/04/19 Yes Dustin Flock, MD  thiamine (VITAMIN B-1) 100 MG tablet Take 1 tablet (100 mg total) by mouth daily. 12/02/17  Yes Thurnell Lose, MD    Physical Exam: Vitals:   03/04/19 1400 03/04/19 1430 03/04/19 1510 03/04/19 1513  BP: 99/70 105/74 111/72   Pulse: 64 75 74   Resp: 17 18 18    Temp:   97.8 F (36.6 C)   TempSrc:   Oral   SpO2: 91% 92% 92%   Weight:    57.6 kg  Height:    5\' 6"  (1.676 m)   General: Not in acute distress HEENT:       Eyes: PERRL, EOMI, no scleral icterus.  ENT: No discharge from the ears and nose, no pharynx injection, no tonsillar enlargement.        Neck: No JVD, no bruit, no mass felt. Heme: No neck lymph node enlargement. Cardiac: S1/S2, RRR, No murmurs, No gallops or rubs. Respiratory: No rales, wheezing, rhonchi or rubs. GI: Soft, nondistended, nontender, no rebound pain, no organomegaly, BS present. GU: No hematuria Ext: No pitting leg edema bilaterally. 2+DP/PT pulse bilaterally. Musculoskeletal: No joint deformities, No joint redness or warmth, no limitation of ROM in spin. Skin: No rashes.  Neuro: drowsy, but is oriented X3, cranial nerves II-XII grossly intact, moves all extremities normally. Psych: Patient is not psychotic, no suicidal or hemocidal ideation.  Labs on Admission: I have personally reviewed following labs and imaging studies  CBC: Recent Labs  Lab 03/04/19 0820  WBC 17.8*  NEUTROABS 16.1*  HGB 14.3  HCT 44.8  MCV 98.5  PLT 243   Basic Metabolic Panel: Recent Labs  Lab 03/04/19 0820  NA 144  K 3.1*  CL 103  CO2  29  GLUCOSE 178*  BUN 17  CREATININE 0.85  CALCIUM 8.7*   GFR: Estimated Creatinine Clearance: 75.2 mL/min (by C-G formula based on SCr of 0.85 mg/dL). Liver Function Tests: Recent Labs  Lab 03/04/19 0820  AST 60*  ALT 37  ALKPHOS 86  BILITOT 0.8  PROT 6.9  ALBUMIN 3.8   No results for input(s): LIPASE, AMYLASE in the last 168 hours. No results for input(s): AMMONIA in the last 168 hours. Coagulation Profile: No results for input(s): INR, PROTIME in the last 168 hours. Cardiac Enzymes: No results for input(s): CKTOTAL, CKMB, CKMBINDEX, TROPONINI in the last 168 hours. BNP (last 3 results) No results for input(s): PROBNP in the last 8760 hours. HbA1C: No results for input(s): HGBA1C in the last 72 hours. CBG: Recent Labs  Lab 03/04/19 1244 03/04/19 1400  GLUCAP 68* 113*   Lipid Profile: No results for input(s): CHOL, HDL, LDLCALC, TRIG, CHOLHDL, LDLDIRECT in the last 72 hours. Thyroid Function Tests: No results for input(s): TSH, T4TOTAL, FREET4, T3FREE, THYROIDAB in the last 72 hours. Anemia Panel: No results for input(s): VITAMINB12, FOLATE, FERRITIN, TIBC, IRON, RETICCTPCT in the last 72 hours. Urine analysis:    Component Value Date/Time   COLORURINE YELLOW (A) 03/04/2019 1016   APPEARANCEUR CLEAR (A) 03/04/2019 1016   APPEARANCEUR Hazy 08/19/2012 1509   LABSPEC 1.039 (H) 03/04/2019 1016   LABSPEC 1.010 08/19/2012 1509   PHURINE 5.0 03/04/2019 1016   GLUCOSEU >=500 (A) 03/04/2019 1016   GLUCOSEU Negative 08/19/2012 1509   HGBUR SMALL (A) 03/04/2019 1016   BILIRUBINUR NEGATIVE 03/04/2019 1016   BILIRUBINUR Negative 08/19/2012 1509   KETONESUR NEGATIVE 03/04/2019 1016   PROTEINUR NEGATIVE 03/04/2019 1016   NITRITE NEGATIVE 03/04/2019 1016   LEUKOCYTESUR NEGATIVE 03/04/2019 1016   LEUKOCYTESUR Negative 08/19/2012 1509   Sepsis Labs: @LABRCNTIP (procalcitonin:4,lacticidven:4) )No results found for this or any previous visit (from the past 240 hour(s)).    Radiological Exams on Admission: CT Head Wo Contrast  Result Date: 03/04/2019 CLINICAL DATA:  Hyperglycemia, confusion EXAM: CT HEAD WITHOUT CONTRAST TECHNIQUE: Contiguous axial images were obtained from the base of the skull through the vertex without intravenous contrast. COMPARISON:  11/28/2017 FINDINGS: Brain: No acute intracranial abnormality. Specifically, no hemorrhage, hydrocephalus, mass lesion, acute infarction, or significant intracranial injury. Vascular: No hyperdense vessel or unexpected calcification. Skull: No acute calvarial abnormality. Sinuses/Orbits: Visualized paranasal sinuses and mastoids clear. Orbital soft tissues unremarkable. Other: None IMPRESSION: Normal study. Electronically  Signed   By: Charlett Nose M.D.   On: 03/04/2019 08:42   CT Angio Chest PE W/Cm &/Or Wo Cm  Result Date: 03/04/2019 CLINICAL DATA:  Hyperglycemia, syncopal episode, history COPD, GERD, MI, irritable bowel syndrome EXAM: CT ANGIOGRAPHY CHEST WITH CONTRAST TECHNIQUE: Multidetector CT imaging of the chest was performed using the standard protocol during bolus administration of intravenous contrast. Multiplanar CT image reconstructions and MIPs were obtained to evaluate the vascular anatomy. CONTRAST:  63mL OMNIPAQUE IOHEXOL 350 MG/ML SOLN IV COMPARISON:  04/01/2011 FINDINGS: Cardiovascular: Minimal atherosclerotic calcification aorta and coronary arteries. Aorta normal caliber without aneurysm or dissection. Heart normal size. No pericardial effusion. Pulmonary arteries adequately opacified and patent. No evidence of pulmonary embolism. Mediastinum/Nodes: Esophagus unremarkable. No thoracic adenopathy. Base of cervical region normal appearance. Lungs/Pleura: Dependent atelectasis bilaterally and at base of RIGHT middle lobe. Emphysematous changes with bullous disease at the apices greater on RIGHT. No acute infiltrate, pleural effusion, or pneumothorax. Upper Abdomen: Visualized upper abdomen unremarkable  Musculoskeletal: No acute osseous findings. Review of the MIP images confirms the above findings. IMPRESSION: No evidence of pulmonary embolism. Bibasilar atelectasis. Aortic Atherosclerosis (ICD10-I70.0). Electronically Signed   By: Ulyses Southward M.D.   On: 03/04/2019 09:42   DG Chest Port 1 View  Result Date: 03/04/2019 CLINICAL DATA:  Found in bed diaphoretic and difficult to awaken, smoker, history COPD, asthma, MI, irritable bowel syndrome EXAM: PORTABLE CHEST 1 VIEW COMPARISON:  Portable exam 0914 hours compared to 11/28/2017 FINDINGS: Normal heart size, mediastinal contours, and pulmonary vascularity. Subsegmental atelectasis RIGHT base. Bullous changes at RIGHT apex. Lungs hyperinflated but otherwise clear. No acute infiltrate, pleural effusion, or pneumothorax. Prior plating of multiple LEFT ribs. IMPRESSION: Hyperinflated lungs with subsegmental atelectasis at RIGHT base. Electronically Signed   By: Ulyses Southward M.D.   On: 03/04/2019 09:31     EKG: Independently reviewed.  Sinus rhythm, QTC 457, low voltage, T wave inversion in V2-V4.  Assessment/Plan Principal Problem:   Syncope Active Problems:   Hypokalemia   COPD (chronic obstructive pulmonary disease) (HCC)   GERD (gastroesophageal reflux disease)   Tobacco abuse   CAD (coronary artery disease)   Anxiety and depression   Vitamin B1 deficiency   Hyperglycemia   Leukocytosis   Acute respiratory failure with hypoxia (HCC)   SIRS (systemic inflammatory response syndrome) (HCC)  Syncope: Etiology is not clear, CT head is negative for acute intracranial abnormalities.  Mental status has improved.  Currently orientated x3 with drowsiness.  No focal neurologic deficit on physical examination.  Low suspicions for stroke.  A potential possibility is polypharmacy, patient is taking multiple sedative medications, including oxycodone, Xanax, nortriptyline, Reglan, recently added Seroquel.  -Placed on MedSurg bed of observation -Cardiac  monitor -Hold all sedative medications -Frequent neuro check -IV fluid -If no improvement, may consider further brain image, such as brain MRI  SIRS: Patient has leukocytosis with WBC 17.8, initially hypotension with blood pressure 86/58, which improved to 98/60 after giving 1 L of normal saline, technically meets criteria for SIRS. No fever, no source of infection identified.  Urinalysis negative.  Chest x-ray negative for infiltration.  Does not seem to have sepsis. -Hold antibiotics now -Follow-up blood culture -will get Procalcitonin and trend lactic acid levels per sepsis protocol. -IVF: 2L of NS bolus in ED, followed by 100 cc/h   Hypokalemia: K= 3.1 on admission. - Repleted - Check Mg level  COPD (chronic obstructive pulmonary disease) (HCC) -Bronchodilators  GERD -Protonix  Tobacco abuse -Nicotine patch  CAD (coronary artery disease): No chest pain, troponin negative. -Observe closely  Anxiety and depression: -hold all home meds now  Hx of Vitamin B1 deficiency: -continue thiamine 100 mg daily -Check Vb1 level  Hyperglycemia: Patient had elevated blood sugar 449 at home, but her sugar is 178, BMI of -Check blood sugarqAM -check A1c  Acute respiratory failure with hypoxia and COPD: Etiology is not clear.  CT angiogram is negative for PE.  Chest x-ray has no infiltration.  Possibly due to sedation in the setting of COPD.  BNP is 105, less likely to have CHF -Bronchodilators -Nasal cannula oxygen to maintain oxygen saturation above 93%    DVT ppx:  SQ Lovenox Code Status: Full code Family Communication: Yes, patient's fianc at bed side Disposition Plan:  Anticipate discharge back to previous home environment Consults called: None Admission status: Med-surg bed for obs  Date of Service 03/04/2019    Lorretta Harp [facility]   If 7PM-7AM, please contact night-coverage [email or web address] 03/04/2019, 6:42 PM

## 2019-03-04 NOTE — ED Notes (Signed)
EDP, [facility] to bedside.

## 2019-03-04 NOTE — Progress Notes (Addendum)
Called by nursing patient found obtunded and low oxygen saturation. In addition to review f presenting problems, nursing reports patient had been improving in her mental status until after she had visitor "[name]" whom was identified as significant other.   Bedside oxygen saturation 98% on 4 LNC. Patient lethargic but did respond to verbal/tactile stimuli.  Knows she is in hospital. Normal heart and lung sounds except shallow breathing Pupils 1 pinpoint.  cbg 205 Narcan given and responsiveness did improve however still refusing to answer most questions and would not admit nor deny taking any additional medication while visitor was present. Post narcan pupils 3-4 and reactive  Requested patient to not taking any medications not provided to her by the nursing stff. Given the risk of decompensation and need for closer monitoring, patient will be transferred to stepdown unit.

## 2019-03-04 NOTE — Progress Notes (Signed)
Notified by NT to come assess patient.  Patient responsive only to sternal rub.  Skin ashen gray, respers sow and shallow.  REspitratory therapy in room.  Saturations 40 on room air, improved to 98% on nonrebreather mask.  Respers at 20/min afterwards.  Remains somnolent.  On call Nurse Practitoner notified and will come assess

## 2019-03-04 NOTE — ED Notes (Signed)
Pt ambulatory to toilet independently without difficulty. 

## 2019-03-04 NOTE — ED Provider Notes (Signed)
[facility] Emergency Department Provider Note  ____________________________________________   First MD Initiated Contact with Patient 03/04/19 [phone]     (approximate)  I have reviewed the triage vital signs and the nursing notes.  History  Chief Complaint Hyperglycemia    HPI Debbie Gay is a 49 y.o. female with history of asthma, bipolar disorder, chronic pain, COPD, thiamine deficiency, CAD who presents to the ED for an episode of unresponsiveness. Per partner at bedside, he tried to wake the patient up this AM and she would not awaken easily. He noticed she was diaphoretic and felt like her breathing was shallow. Checked her pulses, which she did have. He tried to apply a wet rag which did not help. He called 911 who instructed him to do chest compression. He did this for about 3 minutes and she awoke shortly thereafter, prior to EMS arrival. After awakening she was alert and oriented, recognized her fiance, etc. EMS reported hyperglycemia into the 400s, no history of DM.  No medications given. She cannot particularly recall what happened. She is unsure if she may have mixed up her nighttime medications or taken too many medications. Takes oxycodone and Xanax at night. Partner at bedside does state she recently started Seroquel, which she started 2 nights ago. No hx of seizures.    Past Medical Hx Past Medical History:  Diagnosis Date  . Asthma   . Bipolar 1 disorder ([place])   . Chronic pain   . COPD (chronic obstructive pulmonary disease) ([place])   . Depression   . GERD (gastroesophageal reflux disease)   . History of heart attack   . IBS (irritable bowel syndrome)   . Myocardial infarction ([place])   . Panic attacks     Problem List Patient Active Problem List   Diagnosis Date Noted  . Nonintractable headache 04/30/2018  . Confusion 04/16/2018  . Vitamin B1 deficiency 04/16/2018  . Gait abnormality 12/13/2017  . Paresthesia 12/13/2017  . Altered  mental status, unspecified 11/30/2017  . HLD (hyperlipidemia) 11/29/2017  . Tobacco abuse 11/29/2017  . CAD (coronary artery disease) 11/29/2017  . Acute metabolic encephalopathy 65/78/4696  . COPD (chronic obstructive pulmonary disease) ([place])   . Asthma   . GERD (gastroesophageal reflux disease)   . Panic attacks   . Bipolar 1 disorder ([place])   . Depression   . Hypokalemia 11/22/2017  . Nausea and vomiting 11/21/2017  . Intractable nausea and vomiting 11/21/2017    Past Surgical Hx Past Surgical History:  Procedure Laterality Date  . ABDOMINAL HYSTERECTOMY    . COLONOSCOPY WITH PROPOFOL N/A 10/30/2014   Procedure: COLONOSCOPY WITH PROPOFOL;  Surgeon: Lollie Sails, MD;  Location: [facility] ENDOSCOPY;  Service: Endoscopy;  Laterality: N/A;  . COLONOSCOPY WITH PROPOFOL N/A 02/05/2018   Procedure: COLONOSCOPY WITH PROPOFOL;  Surgeon: Lollie Sails, MD;  Location[facility] ENDOSCOPY;  Service: Endoscopy;  Laterality: N/A;  . ESOPHAGOGASTRODUODENOSCOPY (EGD) WITH PROPOFOL N/A 10/30/2014   Procedure: ESOPHAGOGASTRODUODENOSCOPY (EGD) WITH PROPOFOL;  Surgeon: Lollie Sails, MD;  Location: [facility] ENDOSCOPY;  Service: Endoscopy;  Laterality: N/A;  . ESOPHAGOGASTRODUODENOSCOPY (EGD) WITH PROPOFOL N/A 11/23/2017   Procedure: ESOPHAGOGASTRODUODENOSCOPY (EGD) WITH PROPOFOL;  Surgeon: Toledo, Benay Pike, MD;  Location: [facility] ENDOSCOPY;  Service: Gastroenterology;  Laterality: N/A;    Medications Prior to Admission medications   Medication Sig Start Date End Date Taking? Authorizing Provider  acetaminophen (TYLENOL) 325 MG tablet Take 650 mg by mouth daily as needed for mild pain or moderate pain.  [provider]  albuterol (PROVENTIL HFA;VENTOLIN HFA) 108 (90 Base) MCG/ACT inhaler Inhale 2 puffs into the lungs every 6 (six) hours as needed for wheezing or shortness of breath.     [provider]  alprazolam Prudy Feeler) 2 MG tablet Take 2 mg by mouth at bedtime.    [provider]  clarithromycin (BIAXIN) 500 MG tablet Take 1 tablet (500 mg total) by mouth every 12 (twelve) hours. Patient not taking: Reported on 02/05/2018 12/02/17   Leroy Sea, MD  diphenhydrAMINE-APAP, sleep, 38-500 MG TABS Take 1 tablet by mouth at bedtime as needed.    [provider]  estrogens, conjugated, (PREMARIN) 1.25 MG tablet Take 1.25 mg by mouth daily.    [provider]  KLOR-CON M10 10 MEQ tablet Take 20 mEq by mouth 2 (two) times daily.    [provider]  nitroGLYCERIN (NITROLINGUAL) 0.4 MG/SPRAY spray Place 1 spray under the tongue every 5 (five) minutes x 3 doses as needed for chest pain.    [provider]  nortriptyline (PAMELOR) 10 MG capsule Take 1 capsule (10 mg total) by mouth at bedtime. 04/30/18   Levert Feinstein, MD  Oxycodone HCl 10 MG TABS Take 20 mg by mouth every 4 (four) hours as needed.     [provider]  pantoprazole (PROTONIX) 40 MG tablet Take 1 tablet (40 mg total) by mouth daily. 11/23/17 01/22/18  Auburn Bilberry, MD  polyethylene glycol (MIRALAX / Ethelene Hal) packet Take 17 g by mouth daily.    [provider]  promethazine (PHENERGAN) 25 MG tablet Take 25 mg by mouth every 6 (six) hours as needed for nausea or vomiting.    [provider]  sucralfate (CARAFATE) 1 g tablet Take 1 g by mouth 4 (four) times daily -  with meals and at bedtime.    [provider]  SUMAtriptan (IMITREX) 25 MG tablet Take 1 tablet (25 mg total) by mouth every 2 (two) hours as needed for migraine. May repeat in 2 hours if headache persists or recurs. 04/30/18   Levert Feinstein, MD  tamsulosin (FLOMAX) 0.4 MG CAPS capsule Take 1 capsule (0.4 mg total) by mouth daily. Patient not taking: Reported on 02/05/2018 12/02/17   Leroy Sea, MD  thiamine (VITAMIN B-1) 100 MG tablet Take 1 tablet (100 mg total) by mouth daily. 12/02/17   Leroy Sea, MD  tiotropium (SPIRIVA) 18 MCG inhalation capsule Place 18 mcg into  inhaler and inhale daily as needed (for wheezing/shortness of breath).     [provider]    Allergies Levaquin [levofloxacin], Prednisone, and Penicillins  Family Hx Family History  Problem Relation Age of Onset  . COPD Mother   . Irritable bowel syndrome Mother   . Glaucoma Father   . Hyperlipidemia Father     Social Hx Social History   Tobacco Use  . Smoking status: Current Every Day Smoker    Packs/day: 0.50    Years: 20.00    Pack years: 10.00  . Smokeless tobacco: Never Used  Substance Use Topics  . Alcohol use: No  . Drug use: No     Review of Systems  Constitutional: Negative for fever, chills. + episode of decreased responsiveness Eyes: Negative for visual changes. ENT: Negative for sore throat. Cardiovascular: Negative for chest pain. Respiratory: Negative for shortness of breath. Gastrointestinal: Negative for nausea, vomiting.  Genitourinary: Negative for dysuria. Musculoskeletal: Negative for leg swelling. Skin: Negative for rash. Neurological: Negative for headaches.  Physical Exam  Vital Signs: ED Triage Vitals  Enc Vitals Group     BP 03/04/19 0802 94/69     Pulse Rate 03/04/19 0802 97     Resp 03/04/19 0802 16     Temp 03/04/19 0802 98.4 F (36.9 C)     Temp Source 03/04/19 0802 Oral     SpO2 03/04/19 0802 (!) 86 %     Weight 03/04/19 0801 130 lb 1.1 oz (59 kg)     Height 03/04/19 0801 5\' 5"  (1.651 m)     Head Circumference --      Peak Flow --      Pain Score 03/04/19 0801 0     Pain Loc --      Pain Edu? --      Excl. in GC? --     Constitutional: Alert and oriented.  Thin. Head: Normocephalic. Atraumatic. Eyes: Conjunctivae clear. Sclera anicteric. Nose: No congestion. No rhinorrhea. Mouth/Throat: Wearing mask.  Neck: No stridor.   Cardiovascular: Normal rate, regular rhythm. Extremities well perfused. Respiratory: Normal respiratory effort.  Lungs CTAB. Gastrointestinal: Soft. Non-tender. Non-distended.    Musculoskeletal: No lower extremity edema. No deformities. Neurologic:  Normal speech and language. No gross focal neurologic deficits are appreciated. No lateralizing deficits. No facial droop. Skin: Skin is warm, dry and intact. No rash noted. Psychiatric: Mood and affect are appropriate for situation.  EKG  Personally reviewed.   Rate: 85 Rhythm: sinus Axis: normal Intervals: WNL TWI V2, V3, V4 No STEMI    Radiology  CT head: IMPRESSION: Normal study.  CXR: IMPRESSION: Hyperinflated lungs with subsegmental atelectasis at RIGHT base.  CT PE: IMPRESSION: No evidence of pulmonary embolism. Bibasilar atelectasis. Aortic Atherosclerosis (ICD10-I70.0).   Procedures  Procedure(s) performed (including critical care):  Procedures   Initial Impression / Assessment and Plan / ED Course  49 y.o. female who presents to the ED for an episode of decreased responsiveness, as above.  Ddx: PE, arrhythmia, ACS, electrolyte abnormality, polypharmacy/overmedication, infection, recurrent thiamine deficiency, seizure  Will evaluate with labs, imaging.  Labs reveal leukocytosis to 17, unclear etiology, perhaps related to the acute stressor event this morning.  No evidence of pneumonia on chest x-ray.  No evidence of UTI. AO x 3, no neck stiffness/meningismus, fever or AMS on exam to suggest meningitis or encephalitis. Glucose on chemistry 178.   Will plan to admit for further cardiac monitoring, r/o arrhythmia, though at this time based on work up do have a high concern for potential polypharmacy contributing. Discussed w/ hospitalist for admission.    Final Clinical Impression(s) / ED Diagnosis  Final diagnoses:  Decreased responsiveness       Note:  This document was prepared using Dragon voice recognition software and may include unintentional dictation errors.   51., MD 03/04/19 [phone]

## 2019-03-04 NOTE — ED Triage Notes (Addendum)
Arrives with c/o hyperglycemia.  Patient states she 'may' have passed out this morning and boyfriend called EMS.  EMS checked CBG which was 'off the charts'..  Patient is AAOx3, but poor historian.  Recollection of this mornings events unclear to patient.  Patients boyfriend states that when he called her this morning, patient did not answer and he became concerned and went to her house.  He found her in her bed, sweating and difficult to wake.

## 2019-03-04 NOTE — ED Notes (Signed)
Lab notified to add on Vitamin B1 specimen.

## 2019-03-04 NOTE — ED Notes (Signed)
Pt transported to CT ?

## 2019-03-04 NOTE — ED Notes (Signed)
Radiology at bedside

## 2019-03-04 NOTE — ED Notes (Signed)
Pt asked to attempt to provide urine specimen, states she is unable to at this time.

## 2019-03-05 ENCOUNTER — Inpatient Hospital Stay: Admit: 2019-03-05 | Payer: Medicaid Other

## 2019-03-05 DIAGNOSIS — Z881 Allergy status to other antibiotic agents status: Secondary | ICD-10-CM | POA: Diagnosis not present

## 2019-03-05 DIAGNOSIS — F17210 Nicotine dependence, cigarettes, uncomplicated: Secondary | ICD-10-CM | POA: Diagnosis present

## 2019-03-05 DIAGNOSIS — D72829 Elevated white blood cell count, unspecified: Secondary | ICD-10-CM | POA: Diagnosis present

## 2019-03-05 DIAGNOSIS — F419 Anxiety disorder, unspecified: Secondary | ICD-10-CM | POA: Diagnosis not present

## 2019-03-05 DIAGNOSIS — K589 Irritable bowel syndrome without diarrhea: Secondary | ICD-10-CM | POA: Diagnosis present

## 2019-03-05 DIAGNOSIS — I252 Old myocardial infarction: Secondary | ICD-10-CM | POA: Diagnosis not present

## 2019-03-05 DIAGNOSIS — I2510 Atherosclerotic heart disease of native coronary artery without angina pectoris: Secondary | ICD-10-CM | POA: Diagnosis present

## 2019-03-05 DIAGNOSIS — F329 Major depressive disorder, single episode, unspecified: Secondary | ICD-10-CM

## 2019-03-05 DIAGNOSIS — R739 Hyperglycemia, unspecified: Secondary | ICD-10-CM | POA: Diagnosis present

## 2019-03-05 DIAGNOSIS — J9601 Acute respiratory failure with hypoxia: Secondary | ICD-10-CM | POA: Diagnosis present

## 2019-03-05 DIAGNOSIS — J449 Chronic obstructive pulmonary disease, unspecified: Secondary | ICD-10-CM

## 2019-03-05 DIAGNOSIS — Z825 Family history of asthma and other chronic lower respiratory diseases: Secondary | ICD-10-CM | POA: Diagnosis not present

## 2019-03-05 DIAGNOSIS — Z880 Allergy status to penicillin: Secondary | ICD-10-CM | POA: Diagnosis not present

## 2019-03-05 DIAGNOSIS — Z90710 Acquired absence of both cervix and uterus: Secondary | ICD-10-CM | POA: Diagnosis not present

## 2019-03-05 DIAGNOSIS — G8929 Other chronic pain: Secondary | ICD-10-CM | POA: Diagnosis present

## 2019-03-05 DIAGNOSIS — Y900 Blood alcohol level of less than 20 mg/100 ml: Secondary | ICD-10-CM | POA: Diagnosis present

## 2019-03-05 DIAGNOSIS — Z8349 Family history of other endocrine, nutritional and metabolic diseases: Secondary | ICD-10-CM | POA: Diagnosis not present

## 2019-03-05 DIAGNOSIS — Z20822 Contact with and (suspected) exposure to covid-19: Secondary | ICD-10-CM | POA: Diagnosis present

## 2019-03-05 DIAGNOSIS — F319 Bipolar disorder, unspecified: Secondary | ICD-10-CM | POA: Diagnosis present

## 2019-03-05 DIAGNOSIS — R6510 Systemic inflammatory response syndrome (SIRS) of non-infectious origin without acute organ dysfunction: Secondary | ICD-10-CM | POA: Diagnosis present

## 2019-03-05 DIAGNOSIS — I959 Hypotension, unspecified: Secondary | ICD-10-CM | POA: Diagnosis present

## 2019-03-05 DIAGNOSIS — E876 Hypokalemia: Secondary | ICD-10-CM | POA: Diagnosis present

## 2019-03-05 DIAGNOSIS — K219 Gastro-esophageal reflux disease without esophagitis: Secondary | ICD-10-CM | POA: Diagnosis present

## 2019-03-05 DIAGNOSIS — E519 Thiamine deficiency, unspecified: Secondary | ICD-10-CM | POA: Diagnosis present

## 2019-03-05 DIAGNOSIS — E785 Hyperlipidemia, unspecified: Secondary | ICD-10-CM | POA: Diagnosis present

## 2019-03-05 DIAGNOSIS — R55 Syncope and collapse: Principal | ICD-10-CM

## 2019-03-05 DIAGNOSIS — F410 Panic disorder [episodic paroxysmal anxiety] without agoraphobia: Secondary | ICD-10-CM | POA: Diagnosis present

## 2019-03-05 LAB — CBC
HCT: 37.3 % (ref 36.0–46.0)
Hemoglobin: 11.8 g/dL — ABNORMAL LOW (ref 12.0–15.0)
MCH: 31.9 pg (ref 26.0–34.0)
MCHC: 31.6 g/dL (ref 30.0–36.0)
MCV: 100.8 fL — ABNORMAL HIGH (ref 80.0–100.0)
Platelets: 210 10*3/uL (ref 150–400)
RBC: 3.7 MIL/uL — ABNORMAL LOW (ref 3.87–5.11)
RDW: 13.3 % (ref 11.5–15.5)
WBC: 10.6 10*3/uL — ABNORMAL HIGH (ref 4.0–10.5)
nRBC: 0 % (ref 0.0–0.2)

## 2019-03-05 LAB — BASIC METABOLIC PANEL
Anion gap: 7 (ref 5–15)
BUN: 16 mg/dL (ref 6–20)
CO2: 32 mmol/L (ref 22–32)
Calcium: 8 mg/dL — ABNORMAL LOW (ref 8.9–10.3)
Chloride: 102 mmol/L (ref 98–111)
Creatinine, Ser: 0.64 mg/dL (ref 0.44–1.00)
GFR calc Af Amer: >60 mL/min (ref >60)
GFR calc non Af Amer: >60 mL/min (ref >60)
Glucose, Bld: 105 mg/dL — ABNORMAL HIGH (ref 70–99)
Potassium: 3.3 mmol/L — ABNORMAL LOW (ref 3.5–5.1)
Sodium: 141 mmol/L (ref 135–145)

## 2019-03-05 LAB — GLUCOSE, CAPILLARY
Glucose-Capillary: 155 mg/dL — ABNORMAL HIGH (ref 70–99)
Glucose-Capillary: 107 mg/dL — ABNORMAL HIGH (ref 70–99)
Glucose-Capillary: 114 mg/dL — ABNORMAL HIGH (ref 70–99)
Glucose-Capillary: 134 mg/dL — ABNORMAL HIGH (ref 70–99)

## 2019-03-05 LAB — MAGNESIUM: Magnesium: 2 mg/dL (ref 1.7–2.4)

## 2019-03-05 NOTE — Progress Notes (Addendum)
Pt signed against medical advice. Pt stated she removed her IV and had her long sleeve shirt over the arm. I did not witness the IV been taken out. MD notified and charge nurse also notified.

## 2019-03-05 NOTE — Discharge Summary (Signed)
Debbie Gay UXN:[id number] DOB: Feb 09, 1970 DOA: 03/04/2019  PCP: [name], MD  Admit date: 03/04/2019 Discharge date: 03/05/2019  Admitted From: Home Disposition: Signed out AMA  Recommendations for Outpatient Follow-up:  1. Follow up with PCP in 1 week 2. Please obtain BMP/CBC in one week 3. Please follow up on the following pending results: Signed out AMA  Home Health: Signed out AMA   Discharge Condition:Stable CODE STATUS: Full Diet recommendation: Signed out AMA Brief/Interim Summary: From H NP: ALAISA Gay is a 49 y.o. female with medical history significant of hyperlipidemia, COPD, asthma, GERD, depression, anxiety, CAD, myocardial infarction, panic attack, bipolar disorder, IBS, tobacco abuse, vitamin B1 deficiency, who presents with syncope.  Patient was found by her fianc and he stated she had passed out and was difficult waking up.  He noticed she was diaphoretic and felt like her breathing was shallow.  911 was called who instructed him to do chest compression.  He did this for about 3 minutes and patient awoke shortly thereafter prior to EMS arrival.EMS reported hyperglycemia into the 449s. Her mental status gradually improved.  When I saw patient in ED, she is drowsy, but orientated x3.She recently startedSeroquel, which she started 2 nights ago. She is also on oxycodone, Xanax, nortriptyline and Reglan at home.UDS positive for benzo and opiate, Tylenol level less than 10, salicylate level less than 7, alcohol level less than 10 Patient was admitted to the hospitalist service.  Last night patient had a visitor and after the visit her left she was found obtunded and low oxygen saturation.  Nurse practitioner saw patient and gave patient Narcan and responsiveness did improve however patient had refused to answer questions.  Today patient seen and examined.  She was alert and oriented x4.  She was becoming anxious and she decided to sign AGAINST MEDICAL ADVICE.  Discharge  Diagnoses:  Principal Problem:   Syncope Active Problems:   Hypokalemia   COPD (chronic obstructive pulmonary disease) (HCC)   GERD (gastroesophageal reflux disease)   Tobacco abuse   CAD (coronary artery disease)   Anxiety and depression   Vitamin B1 deficiency   Hyperglycemia   Leukocytosis   Acute respiratory failure with hypoxia (HCC)   SIRS (systemic inflammatory response syndrome) (HCC)    Discharge Instructions     Allergies  Allergen Reactions  . Levaquin [Levofloxacin] Swelling  . Prednisone Shortness Of Breath and Swelling  . Penicillins     Has patient had a PCN reaction causing immediate rash, facial/tongue/throat swelling, SOB or lightheadedness with hypotension: Unknown Has patient had a PCN reaction causing severe rash involving mucus membranes or skin necrosis: Unknown Has patient had a PCN reaction that required hospitalization: Unknown Has patient had a PCN reaction occurring within the last 10 years: Unknown If all of the above answers are "NO", then may proceed with Cephalosporin use.    Consultations: None  Procedures/Studies: CT Head Wo Contrast  Result Date: 03/04/2019 CLINICAL DATA:  Hyperglycemia, confusion EXAM: CT HEAD WITHOUT CONTRAST TECHNIQUE: Contiguous axial images were obtained from the base of the skull through the vertex without intravenous contrast. COMPARISON:  11/28/2017 FINDINGS: Brain: No acute intracranial abnormality. Specifically, no hemorrhage, hydrocephalus, mass lesion, acute infarction, or significant intracranial injury. Vascular: No hyperdense vessel or unexpected calcification. Skull: No acute calvarial abnormality. Sinuses/Orbits: Visualized paranasal sinuses and mastoids clear. Orbital soft tissues unremarkable. Other: None IMPRESSION: Normal study. Electronically Signed   By: Charlett Nose M.D.   On: 03/04/2019 08:42   CT Angio  Chest PE W/Cm &/Or Wo Cm  Result Date: 03/04/2019 CLINICAL DATA:  Hyperglycemia, syncopal  episode, history COPD, GERD, MI, irritable bowel syndrome EXAM: CT ANGIOGRAPHY CHEST WITH CONTRAST TECHNIQUE: Multidetector CT imaging of the chest was performed using the standard protocol during bolus administration of intravenous contrast. Multiplanar CT image reconstructions and MIPs were obtained to evaluate the vascular anatomy. CONTRAST:  75mL OMNIPAQUE IOHEXOL 350 MG/ML SOLN IV COMPARISON:  04/01/2011 FINDINGS: Cardiovascular: Minimal atherosclerotic calcification aorta and coronary arteries. Aorta normal caliber without aneurysm or dissection. Heart normal size. No pericardial effusion. Pulmonary arteries adequately opacified and patent. No evidence of pulmonary embolism. Mediastinum/Nodes: Esophagus unremarkable. No thoracic adenopathy. Base of cervical region normal appearance. Lungs/Pleura: Dependent atelectasis bilaterally and at base of RIGHT middle lobe. Emphysematous changes with bullous disease at the apices greater on RIGHT. No acute infiltrate, pleural effusion, or pneumothorax. Upper Abdomen: Visualized upper abdomen unremarkable Musculoskeletal: No acute osseous findings. Review of the MIP images confirms the above findings. IMPRESSION: No evidence of pulmonary embolism. Bibasilar atelectasis. Aortic Atherosclerosis (ICD10-I70.0). Electronically Signed   By: Lavonia Dana M.D.   On: 03/04/2019 09:42   DG Chest Port 1 View  Result Date: 03/04/2019 CLINICAL DATA:  Found in bed diaphoretic and difficult to awaken, smoker, history COPD, asthma, MI, irritable bowel syndrome EXAM: PORTABLE CHEST 1 VIEW COMPARISON:  Portable exam 0914 hours compared to 11/28/2017 FINDINGS: Normal heart size, mediastinal contours, and pulmonary vascularity. Subsegmental atelectasis RIGHT base. Bullous changes at RIGHT apex. Lungs hyperinflated but otherwise clear. No acute infiltrate, pleural effusion, or pneumothorax. Prior plating of multiple LEFT ribs. IMPRESSION: Hyperinflated lungs with subsegmental atelectasis at  RIGHT base. Electronically Signed   By: Lavonia Dana M.D.   On: 03/04/2019 09:31       Subjective: Pt signed ama. Earlier seen , pt had no complaints, was not very interactive with me, but answered some questions  Discharge Exam: Vitals:   03/05/19 1100 03/05/19 1200  BP:  107/71  Pulse: 64 62  Resp: (!) 9 (!) 9  Temp:  98.8 F (37.1 C)  SpO2: 93% 96%   Vitals:   03/05/19 0900 03/05/19 1000 03/05/19 1100 03/05/19 1200  BP: 113/71 114/78  107/71  Pulse: 65 79 64 62  Resp: 18 16 (!) 9 (!) 9  Temp:    98.8 F (37.1 C)  TempSrc:    Oral  SpO2: 95% 97% 93% 96%  Weight:      Height:        General: Pt is alert, awake, not in acute distress Cardiovascular: RRR, S1/S2 +, no rubs, no gallops Respiratory: CTA bilaterally, no wheezing, no rhonchi Abdominal: Soft, NT, ND, bowel sounds + Extremities: no edema, no cyanosis Neuro: grossly intact, aaxox4    The results of significant diagnostics from this hospitalization (including imaging, microbiology, ancillary and laboratory) are listed below for reference.     Microbiology: Recent Results (from the past 240 hour(s))  SARS CORONAVIRUS 2 (TAT 6-24 HRS) Nasopharyngeal Nasopharyngeal Swab     Status: None   Collection Time: 03/04/19 12:32 PM   Specimen: Nasopharyngeal Swab  Result Value Ref Range Status   SARS Coronavirus 2 NEGATIVE NEGATIVE Final    Comment: (NOTE) SARS-CoV-2 target nucleic acids are NOT DETECTED. The SARS-CoV-2 RNA is generally detectable in upper and lower respiratory specimens during the acute phase of infection. Negative results do not preclude SARS-CoV-2 infection, do not rule out co-infections with other pathogens, and should not be used as the sole basis for treatment  or other patient management decisions. Negative results must be combined with clinical observations, patient history, and epidemiological information. The expected result is Negative. Fact Sheet for  Patients: HairSlick.no Fact Sheet for Healthcare Providers: quierodirigir.com This test is not yet approved or cleared by the [place] FDA and  has been authorized for detection and/or diagnosis of SARS-CoV-2 by FDA under an Emergency Use Authorization (EUA). This EUA will remain  in effect (meaning this test can be used) for the duration of the COVID-19 declaration under Section 56 4(b)(1) of the Act, 21 U.S.C. section 360bbb-3(b)(1), unless the authorization is terminated or revoked sooner. Performed at [facility], [address], [place], [place] [address]   CULTURE, BLOOD (ROUTINE X 2) w Reflex to ID Panel     Status: None (Preliminary result)   Collection Time: 03/04/19  3:38 PM   Specimen: BLOOD  Result Value Ref Range Status   Specimen Description BLOOD LEFT ANTECUBITAL  Final   Special Requests   Final    BOTTLES DRAWN AEROBIC AND ANAEROBIC Blood Culture adequate volume   Culture   Final    NO GROWTH < 24 HOURS Performed at [facility], [address] [place], [place] [address]    Report Status PENDING  Incomplete  CULTURE, BLOOD (ROUTINE X 2) w Reflex to ID Panel     Status: None (Preliminary result)   Collection Time: 03/04/19  3:47 PM   Specimen: BLOOD  Result Value Ref Range Status   Specimen Description BLOOD BLOOD LEFT HAND  Final   Special Requests   Final    BOTTLES DRAWN AEROBIC ONLY Blood Culture adequate volume   Culture   Final    NO GROWTH < 24 HOURS Performed at [facility], [address], [place], [place] [address]    Report Status PENDING  Incomplete  MRSA PCR Screening     Status: None   Collection Time: 03/04/19  9:43 PM   Specimen: Nasopharyngeal  Result Value Ref Range Status   MRSA by PCR NEGATIVE NEGATIVE Final    Comment:        The GeneXpert MRSA Assay (FDA approved for NASAL specimens only), is one component of a comprehensive MRSA  colonization surveillance program. It is not intended to diagnose MRSA infection nor to guide or monitor treatment for MRSA infections. Performed at [facility], [address] [place], [place] [address]      Labs: BNP (last 3 results) Recent Labs    03/04/19 1016  BNP 105.0*   Basic Metabolic Panel: Recent Labs  Lab 03/04/19 0820 03/05/19 0525  NA 144 141  K 3.1* 3.3*  CL 103 102  CO2 29 32  GLUCOSE 178* 105*  BUN 17 16  CREATININE 0.85 0.64  CALCIUM 8.7* 8.0*  MG  --  2.0   Liver Function Tests: Recent Labs  Lab 03/04/19 0820  AST 60*  ALT 37  ALKPHOS 86  BILITOT 0.8  PROT 6.9  ALBUMIN 3.8   No results for input(s): LIPASE, AMYLASE in the last 168 hours. No results for input(s): AMMONIA in the last 168 hours. CBC: Recent Labs  Lab 03/04/19 0820 03/05/19 0525  WBC 17.8* 10.6*  NEUTROABS 16.1*  --   HGB 14.3 11.8*  HCT 44.8 37.3  MCV 98.5 100.8*  PLT 243 210   Cardiac Enzymes: No results for input(s): CKTOTAL, CKMB, CKMBINDEX, TROPONINI in the last 168 hours. BNP: Invalid input(s): POCBNP CBG: Recent Labs  Lab 03/04/19  2134 03/04/19 2341 03/05/19 0326 03/05/19 0723 03/05/19 1113  GLUCAP 155* 132* 107* 114* 134*   D-Dimer No results for input(s): DDIMER in the last 72 hours. Hgb A1c Recent Labs    03/04/19 1548  HGBA1C 6.1*   Lipid Profile No results for input(s): CHOL, HDL, LDLCALC, TRIG, CHOLHDL, LDLDIRECT in the last 72 hours. Thyroid function studies No results for input(s): TSH, T4TOTAL, T3FREE, THYROIDAB in the last 72 hours.  Invalid input(s): FREET3 Anemia work up No results for input(s): VITAMINB12, FOLATE, FERRITIN, TIBC, IRON, RETICCTPCT in the last 72 hours. Urinalysis    Component Value Date/Time   COLORURINE YELLOW (A) 03/04/2019 1016   APPEARANCEUR CLEAR (A) 03/04/2019 1016   APPEARANCEUR Hazy 08/19/2012 1509   LABSPEC 1.039 (H) 03/04/2019 1016   LABSPEC 1.010 08/19/2012 1509   PHURINE 5.0  03/04/2019 1016   GLUCOSEU >=500 (A) 03/04/2019 1016   GLUCOSEU Negative 08/19/2012 1509   HGBUR SMALL (A) 03/04/2019 1016   BILIRUBINUR NEGATIVE 03/04/2019 1016   BILIRUBINUR Negative 08/19/2012 1509   KETONESUR NEGATIVE 03/04/2019 1016   PROTEINUR NEGATIVE 03/04/2019 1016   NITRITE NEGATIVE 03/04/2019 1016   LEUKOCYTESUR NEGATIVE 03/04/2019 1016   LEUKOCYTESUR Negative 08/19/2012 1509   Sepsis Labs Invalid input(s): PROCALCITONIN,  WBC,  LACTICIDVEN Microbiology Recent Results (from the past 240 hour(s))  SARS CORONAVIRUS 2 (TAT 6-24 HRS) Nasopharyngeal Nasopharyngeal Swab     Status: None   Collection Time: 03/04/19 12:32 PM   Specimen: Nasopharyngeal Swab  Result Value Ref Range Status   SARS Coronavirus 2 NEGATIVE NEGATIVE Final    Comment: (NOTE) SARS-CoV-2 target nucleic acids are NOT DETECTED. The SARS-CoV-2 RNA is generally detectable in upper and lower respiratory specimens during the acute phase of infection. Negative results do not preclude SARS-CoV-2 infection, do not rule out co-infections with other pathogens, and should not be used as the sole basis for treatment or other patient management decisions. Negative results must be combined with clinical observations, patient history, and epidemiological information. The expected result is Negative. Fact Sheet for Patients: HairSlick.no Fact Sheet for Healthcare Providers: quierodirigir.com This test is not yet approved or cleared by the [place] FDA and  has been authorized for detection and/or diagnosis of SARS-CoV-2 by FDA under an Emergency Use Authorization (EUA). This EUA will remain  in effect (meaning this test can be used) for the duration of the COVID-19 declaration under Section 56 4(b)(1) of the Act, 21 U.S.C. section 360bbb-3(b)(1), unless the authorization is terminated or revoked sooner. Performed at [facility], [address]  [place], [place] [address]   CULTURE, BLOOD (ROUTINE X 2) w Reflex to ID Panel     Status: None (Preliminary result)   Collection Time: 03/04/19  3:38 PM   Specimen: BLOOD  Result Value Ref Range Status   Specimen Description BLOOD LEFT ANTECUBITAL  Final   Special Requests   Final    BOTTLES DRAWN AEROBIC AND ANAEROBIC Blood Culture adequate volume   Culture   Final    NO GROWTH < 24 HOURS Performed at [facility], [address] [place], [place] [address]    Report Status PENDING  Incomplete  CULTURE, BLOOD (ROUTINE X 2) w Reflex to ID Panel     Status: None (Preliminary result)   Collection Time: 03/04/19  3:47 PM   Specimen: BLOOD  Result Value Ref Range Status   Specimen Description BLOOD BLOOD LEFT HAND  Final   Special Requests   Final  BOTTLES DRAWN AEROBIC ONLY Blood Culture adequate volume   Culture   Final    NO GROWTH < 24 HOURS Performed at [facility], [address] [place], [place] [address]    Report Status PENDING  Incomplete  MRSA PCR Screening     Status: None   Collection Time: 03/04/19  9:43 PM   Specimen: Nasopharyngeal  Result Value Ref Range Status   MRSA by PCR NEGATIVE NEGATIVE Final    Comment:        The GeneXpert MRSA Assay (FDA approved for NASAL specimens only), is one component of a comprehensive MRSA colonization surveillance program. It is not intended to diagnose MRSA infection nor to guide or monitor treatment for MRSA infections. Performed at [facility], [address] [place], [place] [address]      Time coordinating discharge: Over 30 minutes  SIGNED:   Lynn Ito, MD  [facility] 03/05/2019, 5:32 PM Pager   If 7PM-7AM, please contact night-coverage [email or web address] Password [email or web address]

## 2019-03-05 NOTE — Progress Notes (Signed)
Patient ID: Debbie Gay, female   DOB: 13-Sep-1970, 49 y.o.   MRN: [id number] Was sent supposedly took her on IV line out.  Nursing had not checked due to patient not allowing nursing to check.  Patient walked out AMA.  I called nursing supervisor, the administrative coordinator and notified them about the situation. They will have someone call the patient.

## 2019-03-09 LAB — CULTURE, BLOOD (ROUTINE X 2)
Culture: NO GROWTH
Culture: NO GROWTH
Special Requests: ADEQUATE
Special Requests: ADEQUATE

## 2019-03-09 LAB — GLUCOSE, CAPILLARY: Glucose-Capillary: 117 mg/dL — ABNORMAL HIGH (ref 70–99)

## 2019-03-10 LAB — VITAMIN B1: Vitamin B1 (Thiamine): 274 nmol/L — ABNORMAL HIGH (ref 66.5–200.0)

## 2019-05-08 ENCOUNTER — Telehealth: Payer: Self-pay | Admitting: Neurology

## 2019-05-08 NOTE — Telephone Encounter (Signed)
The patient was last seen in April 2020 for a video visit. It was requested she return in three months for a follow up. She has continued to notice memory loss. Her headaches have started to occur less frequently. She has been scheduled for an appt with Margie Ege, NP on 05/26/19. She is aware that Maralyn Sago will determine the tests neede,  once an update exam has been completed.

## 2019-05-08 NOTE — Telephone Encounter (Signed)
Pt would like to speak to the RN about possibly ordering a MRI. Please advise.

## 2019-05-26 ENCOUNTER — Ambulatory Visit: Payer: Medicaid Other | Admitting: Neurology

## 2019-05-26 ENCOUNTER — Telehealth: Payer: Self-pay | Admitting: Neurology

## 2019-05-26 ENCOUNTER — Other Ambulatory Visit: Payer: Self-pay

## 2019-05-26 ENCOUNTER — Encounter: Payer: Self-pay | Admitting: Neurology

## 2019-05-26 VITALS — BP 114/64 | HR 87 | Ht 66.0 in | Wt 127.0 lb

## 2019-05-26 DIAGNOSIS — R430 Anosmia: Secondary | ICD-10-CM

## 2019-05-26 DIAGNOSIS — R413 Other amnesia: Secondary | ICD-10-CM | POA: Diagnosis not present

## 2019-05-26 DIAGNOSIS — R410 Disorientation, unspecified: Secondary | ICD-10-CM

## 2019-05-26 NOTE — Telephone Encounter (Signed)
medicaid order sent to GI. They will obtain the auth and reach out to the patient to schedule.  °

## 2019-05-26 NOTE — Patient Instructions (Addendum)
Check MRI of the brain, EEG Get COVID antibodies testing  No changes in medications

## 2019-05-26 NOTE — Progress Notes (Signed)
PATIENT: Debbie Gay DOB: 1970/06/20  REASON FOR VISIT: follow up HISTORY FROM: patient  HISTORY OF PRESENT ILLNESS: Today 05/26/19  HISTORY 04/30/2018 Dr. Krista Blue: She continues working with her GI physician, has better appetite now, but still has frequent nausea, is taking Zofran twice a day, also complains almost constant pressure headaches, had a history of chronic low back pain, taking oxycodone 10 mg 1 and half tablet twice a day,  I reviewed laboratory evaluation in April 2020, elevated B12, B1, likely due to supplement, otherwise normal ESR, C-reactive protein, negative RPR  I also personally reviewed MRI of the brain on April 24, 2018, in comparison to previous MRI of the brain November 2019, this is essentially normal, previously medial dorsal thalamic hyperintensity signal changes has resolved.   Update May 26, 2019 SS: Hospitalized in March 2021 after her fianc found her passed out, could not wake her, she was diaphoretic, shallow breathing, he did chest compressions for about 3 minutes, she awoke, EMS reported CBG 449.  At that time was taking Seroquel, oxycodone, Xanax, nortriptyline, and Reglan, UDS positive for benzos opiates.  Had a visitor in the hospital, after the visit her left, she was found obtunded with low oxygen, she was given Narcan with improved responsiveness.  She signed out AMA.  She presents today accompanied by her boyfriend, Pilar Plate.  She continues to have difficulty with short-term memory, for example, forgot today's appointment.  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  She continues to have GI issues, no appetite, feeling nauseated.  She is drinking mountain dew.  Indicates no longer having headache.  Walking is doing well, no falls.  She is off nortriptyline.  Of most concern, no sense of taste or smell since incident in November 2019, but  says she is just noticed this over the last several months.  When turning the head quickly from side to side, she may feel swimmy headed, get nauseated.  She had CT head in March 2021, was normal.  REVIEW OF SYSTEMS: Out of a complete 14 system review of symptoms, the patient complains only of the following symptoms, and all other reviewed systems are negative.  Memory loss  ALLERGIES: Allergies  Allergen Reactions  . Levaquin [Levofloxacin] Swelling  . Prednisone Shortness Of Breath and Swelling  . Penicillins     Has patient had a PCN reaction causing immediate rash, facial/tongue/throat swelling, SOB or lightheadedness with hypotension: Unknown Has patient had a PCN reaction causing severe rash involving mucus membranes or skin necrosis: Unknown Has patient had a PCN reaction that required hospitalization: Unknown Has patient had a PCN reaction occurring within the last 10 years: Unknown If all of the above answers are "NO", then may proceed with Cephalosporin use.    HOME MEDICATIONS: Outpatient Medications Prior to Visit  Medication Sig Dispense Refill  . alprazolam (XANAX) 2 MG tablet Take 1 mg by mouth at bedtime.     . Cyanocobalamin (VITAMIN B-12) 5000 MCG TBDP Take 1,000 mcg by mouth daily.    . metoCLOPramide (REGLAN) 10 MG tablet Take 10 mg by mouth 4 (four) times daily.    . Omega-3 Fatty Acids (FISH OIL) 1000 MG CAPS Take 1,000 mg by mouth daily.    . Oxycodone HCl 10 MG TABS Take 15 mg by mouth 3 (three) times daily.     Marland Kitchen thiamine (VITAMIN B-1) 100 MG  tablet Take 1 tablet (100 mg total) by mouth daily. 30 tablet 0  . pantoprazole (PROTONIX) 40 MG tablet Take 1 tablet (40 mg total) by mouth daily. (Patient taking differently: Take 40 mg by mouth 2 (two) times daily. ) 30 tablet 1  . metoCLOPramide (REGLAN) 10 MG tablet Take 10 mg by mouth in the morning and at bedtime.    . nortriptyline (PAMELOR) 10 MG capsule Take 1 capsule (10 mg total) by mouth at bedtime. 30 capsule  11  . omeprazole (PRILOSEC) 40 MG capsule Take 40 mg by mouth daily.     No facility-administered medications prior to visit.    PAST MEDICAL HISTORY: Past Medical History:  Diagnosis Date  . Asthma   . Bipolar 1 disorder ([place])   . Chronic pain   . COPD (chronic obstructive pulmonary disease) ([place])   . Depression   . GERD (gastroesophageal reflux disease)   . History of heart attack   . IBS (irritable bowel syndrome)   . Myocardial infarction ([place])   . Panic attacks     PAST SURGICAL HISTORY: Past Surgical History:  Procedure Laterality Date  . ABDOMINAL HYSTERECTOMY    . COLONOSCOPY WITH PROPOFOL N/A 10/30/2014   Procedure: COLONOSCOPY WITH PROPOFOL;  Surgeon: Lollie Sails, MD;  Location[facility] ENDOSCOPY;  Service: Endoscopy;  Laterality: N/A;  . COLONOSCOPY WITH PROPOFOL N/A 02/05/2018   Procedure: COLONOSCOPY WITH PROPOFOL;  Surgeon: Lollie Sails, MD;  Location: [facility] ENDOSCOPY;  Service: Endoscopy;  Laterality: N/A;  . ESOPHAGOGASTRODUODENOSCOPY (EGD) WITH PROPOFOL N/A 10/30/2014   Procedure: ESOPHAGOGASTRODUODENOSCOPY (EGD) WITH PROPOFOL;  Surgeon: Lollie Sails, MD;  Location: [facility] ENDOSCOPY;  Service: Endoscopy;  Laterality: N/A;  . ESOPHAGOGASTRODUODENOSCOPY (EGD) WITH PROPOFOL N/A 11/23/2017   Procedure: ESOPHAGOGASTRODUODENOSCOPY (EGD) WITH PROPOFOL;  Surgeon: Toledo, Benay Pike, MD;  Location: [facility] ENDOSCOPY;  Service: Gastroenterology;  Laterality: N/A;    FAMILY HISTORY: Family History  Problem Relation Age of Onset  . COPD Mother   . Irritable bowel syndrome Mother   . Glaucoma Father   . Hyperlipidemia Father     SOCIAL HISTORY: Social History   Socioeconomic History  . Marital status: Divorced    Spouse name: Not on file  . Number of children: 3  . Years of education: GED/some college  . Highest education level: Not on file  Occupational History  . Occupation: Disabled  Tobacco Use  . Smoking status: Current Every Day Smoker     Packs/day: 0.50    Years: 20.00    Pack years: 10.00  . Smokeless tobacco: Never Used  Substance and Sexual Activity  . Alcohol use: No  . Drug use: No  . Sexual activity: Not Currently  Other Topics Concern  . Not on file  Social History Narrative   Right-handed.   Drinks four 16oz of [employer] per day.   Lives at home grandmother, uncle and daughter.   Social Determinants of Health   Financial Resource Strain:   . Difficulty of Paying Living Expenses:   Food Insecurity:   . Worried About Charity fundraiser in the Last Year:   . Arboriculturist in the Last Year:   Transportation Needs:   . Film/video editor (Medical):   Marland Kitchen Lack of Transportation (Non-Medical):   Physical Activity:   . Days of Exercise per Week:   . Minutes of Exercise per Session:   Stress:   . Feeling of Stress :   Social Connections:   .  Frequency of Communication with Friends and Family:   . Frequency of Social Gatherings with Friends and Family:   . Attends Religious Services:   . Active Member of Clubs or Organizations:   . Attends Archivist Meetings:   Marland Kitchen Marital Status:   Intimate Partner Violence:   . Fear of Current or Ex-Partner:   . Emotionally Abused:   Marland Kitchen Physically Abused:   . Sexually Abused:    PHYSICAL EXAM  Vitals:   05/26/19 1322  BP: 114/64  Pulse: 87  Weight: 127 lb (57.6 kg)  Height: '5\' 6"'  (1.676 m)   Body mass index is 20.5 kg/m.  Generalized: Well developed, in no acute distress  MMSE - Mini Mental State Exam 05/26/2019  Orientation to time 3  Orientation to Place 5  Registration 3  Attention/ Calculation 3  Recall 3  Language- name 2 objects 2  Language- repeat 1  Language- follow 3 step command 3  Language- read & follow direction 1  Write a sentence 1  Copy design 1  Copy design-comments 10 animals  Total score 26    Neurological examination  Mentation: Alert oriented to time, place, history taking. Follows all commands speech  and language fluent Cranial nerve II-XII: Pupils were equal round reactive to light. Extraocular movements were full, visual field were full on confrontational test. Facial sensation and strength were normal. Head turning and shoulder shrug  were normal and symmetric. Motor: The motor testing reveals 5 over 5 strength of all 4 extremities. Good symmetric motor tone is noted throughout.  Sensory: Sensory testing is intact to soft touch on all 4 extremities. No evidence of extinction is noted.  Coordination: Cerebellar testing reveals good finger-nose-finger and heel-to-shin bilaterally.  Gait and station: Gait is normal. Tandem gait is normal. Romberg is negative. No drift is seen.  Reflexes: Deep tendon reflexes are symmetric and normal bilaterally.   DIAGNOSTIC DATA (LABS, IMAGING, TESTING) - I reviewed patient records, labs, notes, testing and imaging myself where available.  Lab Results  Component Value Date   WBC 10.6 (H) 03/05/2019   HGB 11.8 (L) 03/05/2019   HCT 37.3 03/05/2019   MCV 100.8 (H) 03/05/2019   PLT 210 03/05/2019      Component Value Date/Time   NA 141 03/05/2019 0525   NA 137 08/19/2012 1509   K 3.3 (L) 03/05/2019 0525   K 4.3 08/19/2012 1509   CL 102 03/05/2019 0525   CL 103 08/19/2012 1509   CO2 32 03/05/2019 0525   CO2 30 08/19/2012 1509   GLUCOSE 105 (H) 03/05/2019 0525   GLUCOSE 106 (H) 08/19/2012 1509   BUN 16 03/05/2019 0525   BUN 5 (L) 08/19/2012 1509   CREATININE 0.64 03/05/2019 0525   CREATININE 0.65 08/19/2012 1509   CALCIUM 8.0 (L) 03/05/2019 0525   CALCIUM 8.7 08/19/2012 1509   PROT 6.9 03/04/2019 0820   PROT 6.8 08/19/2012 1509   ALBUMIN 3.8 03/04/2019 0820   ALBUMIN 3.1 (L) 11/29/2017 1645   ALBUMIN 3.2 (L) 08/19/2012 1509   AST 60 (H) 03/04/2019 0820   AST 22 08/19/2012 1509   ALT 37 03/04/2019 0820   ALT 15 08/19/2012 1509   ALKPHOS 86 03/04/2019 0820   ALKPHOS 77 08/19/2012 1509   BILITOT 0.8 03/04/2019 0820   BILITOT 0.8  08/19/2012 1509   GFRNONAA >60 03/05/2019 0525   GFRNONAA >60 08/19/2012 1509   GFRAA >60 03/05/2019 0525   GFRAA >60 08/19/2012 1509   Lab Results  Component Value Date   CHOL 173 04/02/2011   HDL 19 (L) 04/02/2011   LDLCALC 125 (H) 04/02/2011   TRIG 144 04/02/2011   Lab Results  Component Value Date   HGBA1C 6.1 (H) 03/04/2019   Lab Results  Component Value Date   VITAMINB12 >2000 (H) 04/24/2018   Lab Results  Component Value Date   TSH 1.980 04/24/2018      ASSESSMENT AND PLAN 49 y.o. year old female  has a past medical history of Asthma, Bipolar 1 disorder ([place]), Chronic pain, COPD (chronic obstructive pulmonary disease) ([place]), Depression, GERD (gastroesophageal reflux disease), History of heart attack, IBS (irritable bowel syndrome), Myocardial infarction ([place]), and Panic attacks. here with:  1.  Acute onset of confusion, unsteady gait November 2019 2.  Worsening memory loss, no longer gait abnormality 3.  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, but just noticed last several months) -Today, MSE 26/30, continues to have difficulty with memory loss, complains of Anosmia -CT head in March 2021 was normal, was hospitalized after she was found unresponsive, was initially hyperglycemic, while hospitalized, had obtunded episode, responded well to Narcan -Patient remains on oxycodone and Xanax -I will check MRI of the brain without contrast given continued memory loss, reported anosmia, loss of consciousness episode in March -Check EEG for possibility of underlying seizure -Will check for Covid antibodies given anosmia  -Recommend she discuss with PCP, getting in with GI, continued loss of appetite, nausea with eating, all of this stemmed from duodenal ulcer, 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 Krista Blue  4.  Persistent headaches -No longer an issue, stopped  nortriptyline   I spent 30 minutes of face-to-face and non-face-to-face time with patient.  This included previsit chart review, lab review, study review, order entry, electronic health record documentation, patient education.  Butler Denmark, AGNP-C, DNP 05/26/2019, 1:32 PM [facility] [address] [place], [place] [address] [phone]

## 2019-05-27 ENCOUNTER — Telehealth: Payer: Self-pay

## 2019-05-27 LAB — SAR COV2 SEROLOGY (COVID19)AB(IGG),IA: DiaSorin SARS-CoV-2 Ab, IgG: POSITIVE

## 2019-05-27 NOTE — Telephone Encounter (Signed)
Pt verified by name and DOB, results given per provider, pt voiced understanding all question answered. 

## 2019-06-16 ENCOUNTER — Other Ambulatory Visit: Payer: Medicaid Other

## 2019-06-24 ENCOUNTER — Telehealth: Payer: Self-pay | Admitting: *Deleted

## 2019-06-24 NOTE — Telephone Encounter (Signed)
Pt's fiance (he did not provide his name) called today and LVM at 1:48 pm stating the pt has an EEG scheduled for tomorrow and he would like a call back today with the time and location. His phone number is [phone].   *I am helping phone room check clinic VMs.

## 2019-06-25 ENCOUNTER — Ambulatory Visit: Payer: Medicaid Other | Admitting: Neurology

## 2019-06-25 ENCOUNTER — Other Ambulatory Visit: Payer: Self-pay

## 2019-06-25 DIAGNOSIS — R410 Disorientation, unspecified: Secondary | ICD-10-CM | POA: Diagnosis not present

## 2019-06-25 DIAGNOSIS — R413 Other amnesia: Secondary | ICD-10-CM

## 2019-07-07 NOTE — Procedures (Signed)
   HISTORY: 49 year old female presented with passing out spells  TECHNIQUE:  This is a routine 16 channel EEG recording with one channel devoted to a limited EKG recording.  It was performed during wakefulness, drowsiness and asleep.  Hyperventilation and photic stimulation were performed as activating procedures.  There are minimum muscle and movement artifact noted.  Upon maximum arousal, posterior dominant waking rhythm consistent of rhythmic alpha range activity, with frequency of 9 hz. Activities are symmetric over the bilateral posterior derivations and attenuated with eye opening.  Hyperventilation produced mild/moderate buildup with higher amplitude and the slower activities noted.  Photic stimulation did not alter the tracing.  During EEG recording, patient developed drowsiness and no deeper stages of sleep was achieved  During EEG recording, there was no epileptiform discharge noted.  EKG demonstrate sinus rhythm, with heart rate of 60 bpm  CONCLUSION: This is a  normal awake EEG.  There is no electrodiagnostic evidence of epileptiform discharge.  Levert Feinstein, M.D. Ph.D.  [facility] [address] [place], [place] [address] Phone: [phone] Fax:      [phone]

## 2019-07-11 ENCOUNTER — Other Ambulatory Visit: Payer: Medicaid Other

## 2019-07-25 ENCOUNTER — Other Ambulatory Visit: Payer: Medicaid Other

## 2019-08-04 ENCOUNTER — Ambulatory Visit
Admission: RE | Admit: 2019-08-04 | Discharge: 2019-08-04 | Disposition: A | Discharge status: Home / Self Care | Payer: Medicaid Other | Source: Ambulatory Visit | Attending: Neurology | Admitting: Neurology

## 2019-08-04 ENCOUNTER — Other Ambulatory Visit: Payer: Self-pay

## 2019-08-04 DIAGNOSIS — R413 Other amnesia: Secondary | ICD-10-CM

## 2019-08-07 ENCOUNTER — Telehealth: Payer: Self-pay

## 2019-08-07 NOTE — Telephone Encounter (Signed)
Pt verified by name and DOB, results given per provider, pt voiced understanding all question answered. 

## 2019-08-07 NOTE — Telephone Encounter (Signed)
-----   Message from Glean Salvo, NP sent at 08/05/2019 11:59 AM EDT ----- MRI of the brain no significant findings for memory loss. Keep appointment with Dr. Terrace Arabia in the next few weeks, she will likely review images.    IMPRESSION: This is a normal MRI of the brain without contrast.  1.   Brain parenchyma appears normal.  2.   Incidental note is made of a mild left mastoid effusion of doubtful significance. This is likely due to mild eustachian tube dysfunction. 3.   No acute findings.

## 2019-08-12 ENCOUNTER — Emergency Department: Payer: Medicaid Other

## 2019-08-12 ENCOUNTER — Inpatient Hospital Stay
Admission: EM | Admit: 2019-08-12 | Discharge: 2019-08-16 | DRG: 917 | Disposition: A | Discharge status: Home / Self Care | Payer: Medicaid Other | Attending: Internal Medicine | Admitting: Internal Medicine

## 2019-08-12 ENCOUNTER — Other Ambulatory Visit: Payer: Self-pay

## 2019-08-12 ENCOUNTER — Encounter: Payer: Self-pay | Admitting: Intensive Care

## 2019-08-12 DIAGNOSIS — E872 Acidosis: Secondary | ICD-10-CM | POA: Diagnosis present

## 2019-08-12 DIAGNOSIS — B961 Klebsiella pneumoniae [K. pneumoniae] as the cause of diseases classified elsewhere: Secondary | ICD-10-CM | POA: Diagnosis present

## 2019-08-12 DIAGNOSIS — G8929 Other chronic pain: Secondary | ICD-10-CM | POA: Diagnosis present

## 2019-08-12 DIAGNOSIS — J9600 Acute respiratory failure, unspecified whether with hypoxia or hypercapnia: Secondary | ICD-10-CM | POA: Diagnosis present

## 2019-08-12 DIAGNOSIS — K219 Gastro-esophageal reflux disease without esophagitis: Secondary | ICD-10-CM | POA: Diagnosis present

## 2019-08-12 DIAGNOSIS — R778 Other specified abnormalities of plasma proteins: Secondary | ICD-10-CM | POA: Diagnosis not present

## 2019-08-12 DIAGNOSIS — F339 Major depressive disorder, recurrent, unspecified: Secondary | ICD-10-CM | POA: Diagnosis present

## 2019-08-12 DIAGNOSIS — F4320 Adjustment disorder, unspecified: Secondary | ICD-10-CM | POA: Diagnosis present

## 2019-08-12 DIAGNOSIS — R7989 Other specified abnormal findings of blood chemistry: Secondary | ICD-10-CM

## 2019-08-12 DIAGNOSIS — R109 Unspecified abdominal pain: Secondary | ICD-10-CM | POA: Diagnosis present

## 2019-08-12 DIAGNOSIS — Z825 Family history of asthma and other chronic lower respiratory diseases: Secondary | ICD-10-CM

## 2019-08-12 DIAGNOSIS — Z20822 Contact with and (suspected) exposure to covid-19: Secondary | ICD-10-CM | POA: Diagnosis present

## 2019-08-12 DIAGNOSIS — Z83511 Family history of glaucoma: Secondary | ICD-10-CM

## 2019-08-12 DIAGNOSIS — Z681 Body mass index (BMI) 19 or less, adult: Secondary | ICD-10-CM

## 2019-08-12 DIAGNOSIS — T424X1A Poisoning by benzodiazepines, accidental (unintentional), initial encounter: Principal | ICD-10-CM | POA: Diagnosis present

## 2019-08-12 DIAGNOSIS — E876 Hypokalemia: Secondary | ICD-10-CM | POA: Diagnosis not present

## 2019-08-12 DIAGNOSIS — F341 Dysthymic disorder: Secondary | ICD-10-CM | POA: Diagnosis present

## 2019-08-12 DIAGNOSIS — Z880 Allergy status to penicillin: Secondary | ICD-10-CM

## 2019-08-12 DIAGNOSIS — E43 Unspecified severe protein-calorie malnutrition: Secondary | ICD-10-CM | POA: Diagnosis present

## 2019-08-12 DIAGNOSIS — R9431 Abnormal electrocardiogram [ECG] [EKG]: Secondary | ICD-10-CM

## 2019-08-12 DIAGNOSIS — A419 Sepsis, unspecified organism: Secondary | ICD-10-CM | POA: Diagnosis present

## 2019-08-12 DIAGNOSIS — R17 Unspecified jaundice: Secondary | ICD-10-CM | POA: Diagnosis present

## 2019-08-12 DIAGNOSIS — Z79899 Other long term (current) drug therapy: Secondary | ICD-10-CM

## 2019-08-12 DIAGNOSIS — J449 Chronic obstructive pulmonary disease, unspecified: Secondary | ICD-10-CM | POA: Diagnosis present

## 2019-08-12 DIAGNOSIS — F509 Eating disorder, unspecified: Secondary | ICD-10-CM | POA: Diagnosis present

## 2019-08-12 DIAGNOSIS — F1120 Opioid dependence, uncomplicated: Secondary | ICD-10-CM | POA: Diagnosis present

## 2019-08-12 DIAGNOSIS — N390 Urinary tract infection, site not specified: Secondary | ICD-10-CM | POA: Diagnosis present

## 2019-08-12 DIAGNOSIS — E519 Thiamine deficiency, unspecified: Secondary | ICD-10-CM | POA: Diagnosis present

## 2019-08-12 DIAGNOSIS — R739 Hyperglycemia, unspecified: Secondary | ICD-10-CM | POA: Diagnosis present

## 2019-08-12 DIAGNOSIS — J9601 Acute respiratory failure with hypoxia: Secondary | ICD-10-CM | POA: Diagnosis present

## 2019-08-12 DIAGNOSIS — Z888 Allergy status to other drugs, medicaments and biological substances status: Secondary | ICD-10-CM

## 2019-08-12 DIAGNOSIS — F17210 Nicotine dependence, cigarettes, uncomplicated: Secondary | ICD-10-CM | POA: Diagnosis present

## 2019-08-12 DIAGNOSIS — I21A1 Myocardial infarction type 2: Secondary | ICD-10-CM | POA: Diagnosis present

## 2019-08-12 DIAGNOSIS — T402X1A Poisoning by other opioids, accidental (unintentional), initial encounter: Secondary | ICD-10-CM | POA: Diagnosis present

## 2019-08-12 DIAGNOSIS — Z83438 Family history of other disorder of lipoprotein metabolism and other lipidemia: Secondary | ICD-10-CM

## 2019-08-12 DIAGNOSIS — I959 Hypotension, unspecified: Secondary | ICD-10-CM | POA: Diagnosis not present

## 2019-08-12 DIAGNOSIS — D649 Anemia, unspecified: Secondary | ICD-10-CM | POA: Diagnosis present

## 2019-08-12 DIAGNOSIS — R001 Bradycardia, unspecified: Secondary | ICD-10-CM | POA: Diagnosis not present

## 2019-08-12 DIAGNOSIS — R079 Chest pain, unspecified: Secondary | ICD-10-CM | POA: Diagnosis not present

## 2019-08-12 DIAGNOSIS — J019 Acute sinusitis, unspecified: Secondary | ICD-10-CM | POA: Diagnosis present

## 2019-08-12 DIAGNOSIS — I252 Old myocardial infarction: Secondary | ICD-10-CM | POA: Diagnosis not present

## 2019-08-12 DIAGNOSIS — F411 Generalized anxiety disorder: Secondary | ICD-10-CM | POA: Diagnosis present

## 2019-08-12 DIAGNOSIS — Z881 Allergy status to other antibiotic agents status: Secondary | ICD-10-CM

## 2019-08-12 DIAGNOSIS — Z90710 Acquired absence of both cervix and uterus: Secondary | ICD-10-CM

## 2019-08-12 DIAGNOSIS — R4182 Altered mental status, unspecified: Secondary | ICD-10-CM

## 2019-08-12 DIAGNOSIS — E785 Hyperlipidemia, unspecified: Secondary | ICD-10-CM | POA: Diagnosis present

## 2019-08-12 LAB — CBC WITH DIFFERENTIAL/PLATELET
Abs Immature Granulocytes: 0.34 10*3/uL — ABNORMAL HIGH (ref 0.00–0.07)
Basophils Absolute: 0.1 10*3/uL (ref 0.0–0.1)
Basophils Relative: 0 %
Eosinophils Absolute: 0 10*3/uL (ref 0.0–0.5)
Eosinophils Relative: 0 %
HCT: 44.1 % (ref 36.0–46.0)
Hemoglobin: 14.1 g/dL (ref 12.0–15.0)
Immature Granulocytes: 3 %
Lymphocytes Relative: 7 %
Lymphs Abs: 0.9 10*3/uL (ref 0.7–4.0)
MCH: 32 pg (ref 26.0–34.0)
MCHC: 32 g/dL (ref 30.0–36.0)
MCV: 100 fL (ref 80.0–100.0)
Monocytes Absolute: 0.4 10*3/uL (ref 0.1–1.0)
Monocytes Relative: 3 %
Neutro Abs: 11.2 10*3/uL — ABNORMAL HIGH (ref 1.7–7.7)
Neutrophils Relative %: 87 %
Platelets: 224 10*3/uL (ref 150–400)
RBC: 4.41 MIL/uL (ref 3.87–5.11)
RDW: 13 % (ref 11.5–15.5)
WBC: 12.9 10*3/uL — ABNORMAL HIGH (ref 4.0–10.5)
nRBC: 0 % (ref 0.0–0.2)

## 2019-08-12 LAB — COMPREHENSIVE METABOLIC PANEL
ALT: 29 U/L (ref 0–44)
AST: 55 U/L — ABNORMAL HIGH (ref 15–41)
Albumin: 3.4 g/dL — ABNORMAL LOW (ref 3.5–5.0)
Alkaline Phosphatase: 69 U/L (ref 38–126)
Anion gap: 15 (ref 5–15)
BUN: 13 mg/dL (ref 6–20)
CO2: 22 mmol/L (ref 22–32)
Calcium: 8.3 mg/dL — ABNORMAL LOW (ref 8.9–10.3)
Chloride: 105 mmol/L (ref 98–111)
Creatinine, Ser: 1.72 mg/dL — ABNORMAL HIGH (ref 0.44–1.00)
GFR calc Af Amer: 40 mL/min — ABNORMAL LOW (ref >60)
GFR calc non Af Amer: 35 mL/min — ABNORMAL LOW (ref >60)
Glucose, Bld: 278 mg/dL — ABNORMAL HIGH (ref 70–99)
Potassium: 4.5 mmol/L (ref 3.5–5.1)
Sodium: 142 mmol/L (ref 135–145)
Total Bilirubin: 1.3 mg/dL — ABNORMAL HIGH (ref 0.3–1.2)
Total Protein: 6.3 g/dL — ABNORMAL LOW (ref 6.5–8.1)

## 2019-08-12 LAB — URINALYSIS, COMPLETE (UACMP) WITH MICROSCOPIC
Bilirubin Urine: NEGATIVE
Glucose, UA: >=500 mg/dL — AB
Ketones, ur: NEGATIVE mg/dL
Leukocytes,Ua: NEGATIVE
Nitrite: NEGATIVE
Protein, ur: 30 mg/dL — AB
Specific Gravity, Urine: 1.01 (ref 1.005–1.030)
pH: 6 (ref 5.0–8.0)

## 2019-08-12 LAB — ACETAMINOPHEN LEVEL: Acetaminophen (Tylenol), Serum: <10 ug/mL — ABNORMAL LOW (ref 10–30)

## 2019-08-12 LAB — URINE DRUG SCREEN, QUALITATIVE (ARMC ONLY)
Amphetamines, Ur Screen: NOT DETECTED
Barbiturates, Ur Screen: NOT DETECTED
Benzodiazepine, Ur Scrn: POSITIVE — AB
Cannabinoid 50 Ng, Ur ~~LOC~~: NOT DETECTED
Cocaine Metabolite,Ur ~~LOC~~: NOT DETECTED
MDMA (Ecstasy)Ur Screen: NOT DETECTED
Methadone Scn, Ur: NOT DETECTED
Opiate, Ur Screen: POSITIVE — AB
Phencyclidine (PCP) Ur S: NOT DETECTED
Tricyclic, Ur Screen: NOT DETECTED

## 2019-08-12 LAB — PROCALCITONIN: Procalcitonin: 0.35 ng/mL

## 2019-08-12 LAB — LACTIC ACID, PLASMA
Lactic Acid, Venous: 5 mmol/L (ref 0.5–1.9)
Lactic Acid, Venous: 2.5 mmol/L (ref 0.5–1.9)

## 2019-08-12 LAB — FOLATE: Folate: >100 ng/mL (ref >5.9)

## 2019-08-12 LAB — TROPONIN I (HIGH SENSITIVITY)
Troponin I (High Sensitivity): 101 ng/L (ref <18)
Troponin I (High Sensitivity): 369 ng/L (ref <18)
Troponin I (High Sensitivity): 1444 ng/L (ref <18)

## 2019-08-12 LAB — PROTIME-INR
INR: 1.4 — ABNORMAL HIGH (ref 0.8–1.2)
Prothrombin Time: 16.8 seconds — ABNORMAL HIGH (ref 11.4–15.2)

## 2019-08-12 LAB — PHOSPHORUS: Phosphorus: 2.8 mg/dL (ref 2.5–4.6)

## 2019-08-12 LAB — HEPARIN LEVEL (UNFRACTIONATED): Heparin Unfractionated: <0.1 IU/mL — ABNORMAL LOW (ref 0.30–0.70)

## 2019-08-12 LAB — GLUCOSE, CAPILLARY
Glucose-Capillary: 242 mg/dL — ABNORMAL HIGH (ref 70–99)
Glucose-Capillary: 85 mg/dL (ref 70–99)
Glucose-Capillary: 92 mg/dL (ref 70–99)

## 2019-08-12 LAB — ETHANOL: Alcohol, Ethyl (B): <10 mg/dL (ref <10)

## 2019-08-12 LAB — APTT: aPTT: 36 seconds (ref 24–36)

## 2019-08-12 LAB — HEMOGLOBIN A1C
Hgb A1c MFr Bld: 6.3 % — ABNORMAL HIGH (ref 4.8–5.6)
Mean Plasma Glucose: 134.11 mg/dL

## 2019-08-12 LAB — CK: Total CK: 137 U/L (ref 38–234)

## 2019-08-12 LAB — SARS CORONAVIRUS 2 BY RT PCR (HOSPITAL ORDER, PERFORMED IN ~~LOC~~ HOSPITAL LAB): SARS Coronavirus 2: NEGATIVE

## 2019-08-12 LAB — PREGNANCY, URINE: Preg Test, Ur: NEGATIVE

## 2019-08-12 MED ORDER — DOCUSATE SODIUM 100 MG PO CAPS: 100.0000 mg | ORAL_CAPSULE | Freq: Two times a day (BID) | ORAL | Status: DC | PRN

## 2019-08-12 MED ORDER — HEPARIN (PORCINE) 25000 UT/250ML-% IV SOLN
INTRAVENOUS | Status: AC
  Filled 2019-08-12: qty 250

## 2019-08-12 MED ORDER — THIAMINE HCL 100 MG/ML IJ SOLN
Freq: Once | INTRAVENOUS | Status: AC
  Filled 2019-08-12: qty 1000

## 2019-08-12 MED ORDER — THIAMINE HCL 100 MG PO TABS
100.0000 mg | ORAL_TABLET | Freq: Every day | ORAL | Status: DC
  Administered 2019-08-12 – 2019-08-16 (×5): 100 mg via ORAL
  Filled 2019-08-12 (×5): qty 1

## 2019-08-12 MED ORDER — HEPARIN (PORCINE) 25000 UT/250ML-% IV SOLN: 10.0000 [IU]/kg/h | INTRAVENOUS | Status: DC

## 2019-08-12 MED ORDER — ATORVASTATIN CALCIUM 20 MG PO TABS
40.0000 mg | ORAL_TABLET | Freq: Every day | ORAL | Status: DC
  Administered 2019-08-12 – 2019-08-16 (×5): 40 mg via ORAL
  Filled 2019-08-12 (×5): qty 2

## 2019-08-12 MED ORDER — NALOXONE HCL 2 MG/2ML IJ SOSY
PREFILLED_SYRINGE | INTRAMUSCULAR | Status: AC
  Administered 2019-08-12: 0.4 mg via INTRAVENOUS
  Filled 2019-08-12: qty 2

## 2019-08-12 MED ORDER — SODIUM CHLORIDE 0.9 % IV SOLN
1.0000 g | Freq: Once | INTRAVENOUS | Status: AC
  Administered 2019-08-12: 1 g via INTRAVENOUS
  Filled 2019-08-12: qty 1

## 2019-08-12 MED ORDER — SUCCINYLCHOLINE CHLORIDE 20 MG/ML IJ SOLN
100.0000 mg | Freq: Once | INTRAMUSCULAR | Status: AC
  Administered 2019-08-12: 100 mg via INTRAVENOUS

## 2019-08-12 MED ORDER — HEPARIN SODIUM (PORCINE) 5000 UNIT/ML IJ SOLN: 60.0000 [IU]/kg | Freq: Once | INTRAMUSCULAR | Status: DC

## 2019-08-12 MED ORDER — POLYETHYLENE GLYCOL 3350 17 G PO PACK: 17.0000 g | PACK | Freq: Every day | ORAL | Status: DC | PRN

## 2019-08-12 MED ORDER — ASPIRIN EC 81 MG PO TBEC
81.0000 mg | DELAYED_RELEASE_TABLET | Freq: Every day | ORAL | Status: DC
  Administered 2019-08-13 – 2019-08-16 (×4): 81 mg via ORAL
  Filled 2019-08-12 (×4): qty 1

## 2019-08-12 MED ORDER — LACTATED RINGERS IV SOLN: INTRAVENOUS | Status: DC

## 2019-08-12 MED ORDER — KETAMINE HCL 10 MG/ML IJ SOLN
60.0000 mg | Freq: Once | INTRAMUSCULAR | Status: AC
  Administered 2019-08-12: 60 mg via INTRAVENOUS

## 2019-08-12 MED ORDER — NOREPINEPHRINE 4 MG/250ML-% IV SOLN
INTRAVENOUS | Status: AC
  Filled 2019-08-12: qty 250

## 2019-08-12 MED ORDER — NALOXONE HCL 2 MG/2ML IJ SOSY: 0.4000 mg | PREFILLED_SYRINGE | Freq: Once | INTRAMUSCULAR | Status: AC

## 2019-08-12 MED ORDER — SODIUM CHLORIDE 0.9 % IV SOLN
2.0000 g | Freq: Two times a day (BID) | INTRAVENOUS | Status: DC
  Administered 2019-08-12: 2 g via INTRAVENOUS
  Filled 2019-08-12 (×2): qty 2

## 2019-08-12 MED ORDER — KETAMINE HCL 10 MG/ML IJ SOLN
1.0000 mg/kg | Freq: Once | INTRAMUSCULAR | Status: AC
  Administered 2019-08-12: 54 mg via INTRAVENOUS
  Filled 2019-08-12: qty 1

## 2019-08-12 MED ORDER — PANTOPRAZOLE SODIUM 40 MG IV SOLR
40.0000 mg | INTRAVENOUS | Status: DC
  Administered 2019-08-12 – 2019-08-15 (×4): 40 mg via INTRAVENOUS
  Filled 2019-08-12 (×4): qty 40

## 2019-08-12 MED ORDER — SODIUM CHLORIDE 0.9 % IV BOLUS
1000.0000 mL | Freq: Once | INTRAVENOUS | Status: AC
  Administered 2019-08-12: 1000 mL via INTRAVENOUS

## 2019-08-12 MED ORDER — NOREPINEPHRINE 4 MG/250ML-% IV SOLN
0.0000 ug/min | INTRAVENOUS | Status: DC
  Administered 2019-08-12: 13 ug/min via INTRAVENOUS
  Administered 2019-08-12: 10 ug/min via INTRAVENOUS
  Administered 2019-08-12: 20 ug/min via INTRAVENOUS
  Filled 2019-08-12 (×3): qty 250

## 2019-08-12 MED ORDER — HEPARIN BOLUS VIA INFUSION
3200.0000 [IU] | Freq: Once | INTRAVENOUS | Status: AC
  Administered 2019-08-12: 3200 [IU] via INTRAVENOUS
  Filled 2019-08-12: qty 3200

## 2019-08-12 MED ORDER — HEPARIN SODIUM (PORCINE) 5000 UNIT/ML IJ SOLN
5000.0000 [IU] | Freq: Three times a day (TID) | INTRAMUSCULAR | Status: DC
  Administered 2019-08-12: 5000 [IU] via SUBCUTANEOUS
  Filled 2019-08-12 (×2): qty 1

## 2019-08-12 MED ORDER — HEPARIN (PORCINE) 25000 UT/250ML-% IV SOLN
750.0000 [IU]/h | INTRAVENOUS | Status: DC
  Administered 2019-08-12: 600 [IU]/h via INTRAVENOUS
  Filled 2019-08-12: qty 250

## 2019-08-12 MED ORDER — SODIUM CHLORIDE 0.9 % IV SOLN
0.5000 mg/kg/h | INTRAVENOUS | Status: DC
  Administered 2019-08-13: 0.5 mg/kg/h via INTRAVENOUS
  Filled 2019-08-12 (×2): qty 10

## 2019-08-12 MED ORDER — HEPARIN (PORCINE) 25000 UT/250ML-% IV SOLN
600.0000 [IU]/h | INTRAVENOUS | Status: DC
  Administered 2019-08-12: 600 [IU]/h via INTRAVENOUS

## 2019-08-12 MED ORDER — INSULIN ASPART 100 UNIT/ML ~~LOC~~ SOLN
0.0000 [IU] | SUBCUTANEOUS | Status: DC
  Administered 2019-08-13: 2 [IU] via SUBCUTANEOUS
  Filled 2019-08-12: qty 1

## 2019-08-12 MED ORDER — ASPIRIN 300 MG RE SUPP
300.0000 mg | Freq: Once | RECTAL | Status: AC
  Administered 2019-08-12: 300 mg via RECTAL
  Filled 2019-08-12: qty 1

## 2019-08-12 MED ORDER — SODIUM CHLORIDE 0.9 % IV SOLN
0.5000 mg/kg/h | INTRAVENOUS | Status: DC
  Administered 2019-08-12: 0.5 mg/kg/h via INTRAVENOUS
  Filled 2019-08-12: qty 50
  Filled 2019-08-12: qty 5

## 2019-08-12 NOTE — Progress Notes (Signed)
ANTICOAGULATION CONSULT NOTE  Pharmacy Consult for heparin Indication: chest pain/ACS  Allergies  Allergen Reactions  . Levaquin [Levofloxacin] Swelling  . Prednisone Shortness Of Breath and Swelling  . Penicillins     Has patient had a PCN reaction causing immediate rash, facial/tongue/throat swelling, SOB or lightheadedness with hypotension: Unknown Has patient had a PCN reaction causing severe rash involving mucus membranes or skin necrosis: Unknown Has patient had a PCN reaction that required hospitalization: Unknown Has patient had a PCN reaction occurring within the last 10 years: Unknown If all of the above answers are "NO", then may proceed with Cephalosporin use.    Patient Measurements: Height: 5\' 5"  (165.1 cm) Weight: 54.4 kg (120 lb) IBW/kg (Calculated) : 57 Heparin Dosing Weight: 54 kg  Vital Signs: Temp: 100.4 F (38 C) (08/10 1645) Temp Source: Axillary (08/10 0949) BP: 127/85 (08/10 1645) Pulse Rate: 76 (08/10 1645)  Labs: Recent Labs    08/12/19 0934 08/12/19 1122  HGB 14.1  --   HCT 44.1  --   PLT 224  --   APTT  --  36  LABPROT  --  16.8*  INR  --  1.4*  CREATININE 1.72*  --   TROPONINIHS 101* 369*    Estimated Creatinine Clearance: 34.7 mL/min (A) (by C-G formula based on SCr of 1.72 mg/dL (H)).   Medical History: Past Medical History:  Diagnosis Date  . Asthma   . Bipolar 1 disorder ([facility])   . Chronic pain   . COPD (chronic obstructive pulmonary disease) ([facility])   . Depression   . GERD (gastroesophageal reflux disease)   . History of heart attack   . IBS (irritable bowel syndrome)   . Myocardial infarction ([facility])   . Panic attacks      Assessment: 49 year old female found unresponsive by mother, brought in by EMS. Patient took oxycodone and alprazolam per report from family member. Patient with small pupils in ED. Patient requiring intubation to maintain airway. Troponin elevated, EKG with ST segment depression. Pharmacy consulted for  heparin drip.  Goal of Therapy:  Heparin level 0.3-0.7 units/ml Monitor platelets by anticoagulation protocol: Yes   Plan:  Heparin 3200 unit bolus followed by heparin drip at 600 units/hr. HL at 2100. CBC daily while on heparin drip.  2101, PharmD 08/12/2019,5:03 PM

## 2019-08-12 NOTE — Progress Notes (Signed)
CODE SEPSIS - PHARMACY COMMUNICATION  **Broad Spectrum Antibiotics should be administered within 1 hour of Sepsis diagnosis**  Time Code Sepsis Called/Page Received: 1039  Antibiotics Ordered: cefepime  Time of 1st antibiotic administration: 1114  Additional action taken by pharmacy:   If necessary, Name of Provider/Nurse Contacted:     Marty Heck ,PharmD Clinical Pharmacist  08/12/2019  11:24 AM

## 2019-08-12 NOTE — Progress Notes (Signed)
Pharmacy Antibiotic Note  Debbie Gay is a 49 y.o. female admitted on 08/12/2019. Pharmacy has been consulted for cefepime dosing.  Plan: Cefepime 2 g IV 12h  Height: 5\' 5"  (165.1 cm) Weight: 54.4 kg (120 lb) IBW/kg (Calculated) : 57  Temp (24hrs), Avg:99.5 F (37.5 C), Min:97 F (36.1 C), Max:100.4 F (38 C)  Recent Labs  Lab 08/12/19 0934 08/12/19 1122  WBC 12.9*  --   CREATININE 1.72*  --   LATICACIDVEN 5.0* 2.5*    Estimated Creatinine Clearance: 34.7 mL/min (A) (by C-G formula based on SCr of 1.72 mg/dL (H)).    Allergies  Allergen Reactions  . Levaquin [Levofloxacin] Swelling  . Prednisone Shortness Of Breath and Swelling  . Penicillins     Has patient had a PCN reaction causing immediate rash, facial/tongue/throat swelling, SOB or lightheadedness with hypotension: Unknown Has patient had a PCN reaction causing severe rash involving mucus membranes or skin necrosis: Unknown Has patient had a PCN reaction that required hospitalization: Unknown Has patient had a PCN reaction occurring within the last 10 years: Unknown If all of the above answers are "NO", then may proceed with Cephalosporin use.      Thank you for allowing pharmacy to be a part of this patient's care.  10/12/19, PharmD 08/12/2019 6:36 PM

## 2019-08-12 NOTE — ED Notes (Signed)
New bag of levo started at this time.

## 2019-08-12 NOTE — ED Notes (Signed)
Ketamine titrated back down post central line to 0.5 mg/kgs/hr.

## 2019-08-12 NOTE — ED Notes (Signed)
Waiting for RT to take patient to CT.

## 2019-08-12 NOTE — ED Notes (Signed)
PT  PLACED  UNDER  IVC PAPERS  PER   DR Darnelle Catalan  MD  INFORMED  AMBER  RN  AND  HEATHER  CHARGE NURSE

## 2019-08-12 NOTE — ED Notes (Signed)
No gag reflex found at this time. Preparing to intubate patient per MD orders

## 2019-08-12 NOTE — Progress Notes (Signed)
CH received verbal referral to assist in locating family from Dr. Darnelle Catalan; family (sister and fiance) in triage area --> CH brought them to pt.'s rm. at MD request.  Pt. lying in bed intubated, but opened eyes when she heard sister and fiance's voice, able to nod in response to questions.  Dr. Darnelle Catalan spoke w/family re: plan for treatment.  Pt. indicated by shaking her head she is not yet ready to come off ventilator --> team will wait until pt. is slightly more awake.  Fiance and sister relieved to see pt. open her eyes; when [facility] left, family at bedside, no needs expressed at this time.  CH remains available as needed.

## 2019-08-12 NOTE — ED Notes (Signed)
Dr. Darnelle Catalan at bedside with this RN and Jae Dire RN working on central line.

## 2019-08-12 NOTE — BH Assessment (Signed)
Assessment Note Debbie Gay is an 49 y.o. female who presents to [facility] ED involuntarily for treatment. Per triage note, Patient found unresponsive this AM by mother. EMS arrived to snoring respirations coming from patient. EMS administered 2 narcan with no improvement. EMS used bag valve and obtained 96% O2. EMS administered 2 more narcan and reported pt no longer having snoring respirations. Responsive to pain.  Pt was unable to be aroused to participate in the assessment. Collateral was provided by pt's family to complete assessment.   TTS contacted Debbie Gay (fianc [phone]) who was present with pt's sister        Debbie Gay [phone]). Debbie Gay directed TTS to talk with Debbie Gay. Debbie Gay confirmed the information provided to triage RN. Debbie Gay confirmed pt's grandmother finding her unresponsive in her bedroom this morning. Debbie Gay reports to be unsure if pt OD on her medications accidentally but denies pt attempting to commit suicide. Debbie Gay denies any signs of stressors or triggers for pt and stated "she was just fine and happy yesterday". Debbie Gay denies pt currently endorsing any SI or depression symptoms to her knowledge. Debbie Gay reports pt to have a long medical hx related to an eating disorder and COPD. Debbie Gay reports pt experiencing a similar episode as today over a year ago and MD's believing at that time pt OD when after further assessment is was related to lack of vitamin supplements. Debbie Gay denies pt to have an INPT or SI hx. Debbie Gay reports pt to receive OPT services with Dr. Fannie Knee in [place], [place] for medication management but was unable to provide further information at this time. Debbie Gay reports pt to have a hx of depression but denies pt taking any current prescribed medications for depression. Debbie Gay denies pt have a SA hx. Debbie Gay also, denies a family hx of MH, SA or SI. Debbie Gay reports no current safety concerns and expressed to be available by phone as needed.   Disposition is pending provider  consult  Diagnosis: Per hx MDD   Past Medical History:  Past Medical History:  Diagnosis Date  . Asthma   . Bipolar 1 disorder (HCC)   . Chronic pain   . COPD (chronic obstructive pulmonary disease) (HCC)   . Depression   . GERD (gastroesophageal reflux disease)   . History of heart attack   . IBS (irritable bowel syndrome)   . Myocardial infarction (HCC)   . Panic attacks     Past Surgical History:  Procedure Laterality Date  . ABDOMINAL HYSTERECTOMY    . COLONOSCOPY WITH PROPOFOL N/A 10/30/2014   Procedure: COLONOSCOPY WITH PROPOFOL;  Surgeon: Christena Deem, MD;  Location: [facility] ENDOSCOPY;  Service: Endoscopy;  Laterality: N/A;  . COLONOSCOPY WITH PROPOFOL N/A 02/05/2018   Procedure: COLONOSCOPY WITH PROPOFOL;  Surgeon: Christena Deem, MD;  Location: [facility] ENDOSCOPY;  Service: Endoscopy;  Laterality: N/A;  . ESOPHAGOGASTRODUODENOSCOPY (EGD) WITH PROPOFOL N/A 10/30/2014   Procedure: ESOPHAGOGASTRODUODENOSCOPY (EGD) WITH PROPOFOL;  Surgeon: Christena Deem, MD;  Location: [facility] ENDOSCOPY;  Service: Endoscopy;  Laterality: N/A;  . ESOPHAGOGASTRODUODENOSCOPY (EGD) WITH PROPOFOL N/A 11/23/2017   Procedure: ESOPHAGOGASTRODUODENOSCOPY (EGD) WITH PROPOFOL;  Surgeon: Toledo, Boykin Nearing, MD;  Location: [facility] ENDOSCOPY;  Service: Gastroenterology;  Laterality: N/A;    Family History:  Family History  Problem Relation Age of Onset  . COPD Mother   . Irritable bowel syndrome Mother   . Glaucoma Father   . Hyperlipidemia Father     Social History:  reports that she has been smoking. She has a 10.00 pack-year  smoking history. She has never used smokeless tobacco. She reports that she does not drink alcohol and does not use drugs.  Additional Social History:  Alcohol / Drug Use Pain Medications: see mar Prescriptions: see mar Over the Counter: see mar History of alcohol / drug use?: No history of alcohol / drug abuse  CIWA: CIWA-Ar BP: 120/87 Pulse Rate: 83 COWS:     Allergies:  Allergies  Allergen Reactions  . Levaquin [Levofloxacin] Swelling  . Prednisone Shortness Of Breath and Swelling  . Penicillins     Has patient had a PCN reaction causing immediate rash, facial/tongue/throat swelling, SOB or lightheadedness with hypotension: Unknown Has patient had a PCN reaction causing severe rash involving mucus membranes or skin necrosis: Unknown Has patient had a PCN reaction that required hospitalization: Unknown Has patient had a PCN reaction occurring within the last 10 years: Unknown If all of the above answers are "NO", then may proceed with Cephalosporin use.    Home Medications: (Not in a hospital admission)   OB/GYN Status:  No LMP recorded (lmp unknown). Patient has had a hysterectomy.  General Assessment Data Location of Assessment: [facility] ED TTS Assessment: In system Is this a Tele or Face-to-Face Assessment?: Face-to-Face Is this an Initial Assessment or a Re-assessment for this encounter?: Initial Assessment Patient Accompanied by:: Other (fiance, mother, sister ) Language Other than English: No Living Arrangements: Other (Comment) (private home ) What gender do you identify as?: Female Date Telepsych consult ordered in [facility]: 08/12/19 Marital status: Other (comment) (Engaged ) Maiden name: n/a Pregnancy Status: No Living Arrangements: Other relatives Can pt return to current living arrangement?: Yes Admission Status: Voluntary Is patient capable of signing voluntary admission?: Yes Referral Source: Self/Family/Friend Insurance type: Medicaid     Crisis Care Plan Living Arrangements: Other relatives Legal Guardian:  (self) Name of Psychiatrist: Dr. Fannie Knee  [facility]) Name of Therapist: None reported   Education Status Is patient currently in school?: No Is the patient employed, unemployed or receiving disability?: Receiving disability income  Risk to self with the past 6 months Suicidal Ideation:  (UTA) Has patient  been a risk to self within the past 6 months prior to admission? : No (According to family ) Suicidal Intent:  (UTA) Has patient had any suicidal intent within the past 6 months prior to admission? : No (According to family ) Is patient at risk for suicide?: No, but patient needs Medical Clearance Suicidal Plan?: No Has patient had any suicidal plan within the past 6 months prior to admission? : No Access to Means: No What has been your use of drugs/alcohol within the last 12 months?: None reported  Previous Attempts/Gestures: No How many times?: 0 Other Self Harm Risks: None reported  Triggers for Past Attempts: None known Intentional Self Injurious Behavior: None Family Suicide History: No Recent stressful life event(s):  (UTA) Persecutory voices/beliefs?:  (UTA) Depression: Yes (Accordingly to family pt has hx ) Depression Symptoms:  (UTA) Substance abuse history and/or treatment for substance abuse?: No Suicide prevention information given to non-admitted patients: Not applicable  Risk to Others within the past 6 months Homicidal Ideation:  (UTA) Does patient have any lifetime risk of violence toward others beyond the six months prior to admission? : Unknown (UTA) Thoughts of Harm to Others:  (UTA) Current Homicidal Intent:  (UTA) Current Homicidal Plan:  (UTA) Access to Homicidal Means:  (UTA) Identified Victim: UTA History of harm to others?: No Assessment of Violence: None Noted Violent Behavior Description: N/A Does  patient have access to weapons?: No Criminal Charges Pending?: No Does patient have a court date: No Is patient on probation?: No  Psychosis Hallucinations:  (UTA) Delusions:  (UTA)  Mental Status Report Appearance/Hygiene: Unable to Assess Eye Contact: Unable to Assess Motor Activity: Unable to assess Speech: Unable to assess Level of Consciousness: Unable to assess Mood:  (UTA) Affect: Unable to Assess Anxiety Level:  (UTA) Thought Processes:  Unable to Assess Judgement: Unable to Assess Orientation: Unable to assess Obsessive Compulsive Thoughts/Behaviors: Unable to Assess  Cognitive Functioning Concentration: Unable to Assess Memory: Unable to Assess Is patient IDD: No Insight: Unable to Assess Impulse Control: Unable to Assess Appetite: Poor Have you had any weight changes? : No Change Sleep: Unable to Assess Total Hours of Sleep:  (UTA) Vegetative Symptoms: Unable to Assess  ADLScreening [facility] Assessment Services) Patient's cognitive ability adequate to safely complete daily activities?: Yes Patient able to express need for assistance with ADLs?: Yes Independently performs ADLs?: Yes (appropriate for developmental age)  Prior Inpatient Therapy Prior Inpatient Therapy: No  Prior Outpatient Therapy Prior Outpatient Therapy: Yes Prior Therapy Dates: CURRENT Prior Therapy Facilty/Provider(s): Dr. Fannie Knee  (family reports to be unsure & location to [facility] ) Reason for Treatment: Med management  Does patient have an ACCT team?: No Does patient have Intensive In-House Services?  : No Does patient have [facility] services? : Unknown Does patient have P4CC services?: Unknown  ADL Screening (condition at time of admission) Patient's cognitive ability adequate to safely complete daily activities?: Yes Is the patient deaf or have difficulty hearing?: No Does the patient have difficulty seeing, even when wearing glasses/contacts?: No Does the patient have difficulty concentrating, remembering, or making decisions?: No Patient able to express need for assistance with ADLs?: Yes Does the patient have difficulty dressing or bathing?: No Independently performs ADLs?: Yes (appropriate for developmental age) Does the patient have difficulty walking or climbing stairs?: No Weakness of Legs: None Weakness of Arms/Hands: None  Home Assistive Devices/Equipment Home Assistive Devices/Equipment: None  Therapy Consults  (therapy consults require a physician order) PT Evaluation Needed: No OT Evalulation Needed: No SLP Evaluation Needed: No Abuse/Neglect Assessment (Assessment to be complete while patient is alone) Abuse/Neglect Assessment Can Be Completed: Yes Physical Abuse: Denies Verbal Abuse: Denies Sexual Abuse: Denies Exploitation of patient/patient's resources: Denies Self-Neglect: Denies Values / Beliefs Cultural Requests During Hospitalization: None Spiritual Requests During Hospitalization: None Consults Spiritual Care Consult Needed: No Transition of Care Team Consult Needed: No Advance Directives (For Healthcare) Does Patient Have a Medical Advance Directive?: Unable to assess, patient is non-responsive or altered mental status          Disposition:  Disposition Initial Assessment Completed for this Encounter: Yes Patient referred to: Other (Comment)  On Site Evaluation by:   Reviewed with Physician:    Opal Sidles 08/12/2019 2:16 PM

## 2019-08-12 NOTE — ED Notes (Signed)
Pt back from CT at this time. Patient stable, appears to be waking up/opening eyes and making purposeful movement. Dr. Darnelle Catalan notified, this RN waiting for orders for sedative drip at this time.

## 2019-08-12 NOTE — ED Triage Notes (Signed)
Patient found unresponsive this AM by mother. EMS arrived to snoring respirations coming from patient. EMS administered 2 narcan with no improvement. EMS used bag valve and obtained 96% O2. EMS administered 2 more narcan and reported pt no longer having snoring respirations. Responsive to pain. EMS vitals 113/74 b/p, 10L 02 100%, 110HR, co2 44, 409 blood sugar

## 2019-08-12 NOTE — Progress Notes (Signed)
ANTICOAGULATION CONSULT NOTE  Pharmacy Consult for heparin Indication: chest pain/ACS  Allergies  Allergen Reactions   Levaquin [Levofloxacin] Swelling   Prednisone Shortness Of Breath and Swelling   Penicillins     Has patient had a PCN reaction causing immediate rash, facial/tongue/throat swelling, SOB or lightheadedness with hypotension: Unknown Has patient had a PCN reaction causing severe rash involving mucus membranes or skin necrosis: Unknown Has patient had a PCN reaction that required hospitalization: Unknown Has patient had a PCN reaction occurring within the last 10 years: Unknown If all of the above answers are "NO", then may proceed with Cephalosporin use.    Patient Measurements: Height: 5\' 5"  (165.1 cm) Weight: 54.4 kg (120 lb) IBW/kg (Calculated) : 57 Heparin Dosing Weight: 54 kg  Vital Signs: Temp: 99.9 F (37.7 C) (08/10 2115) Temp Source: Axillary (08/10 0949) BP: 124/83 (08/10 2115) Pulse Rate: 66 (08/10 2115)  Labs: Recent Labs    08/12/19 0934 08/12/19 1122 08/12/19 1907  HGB 14.1  --   --   HCT 44.1  --   --   PLT 224  --   --   APTT  --  36  --   LABPROT  --  16.8*  --   INR  --  1.4*  --   CREATININE 1.72*  --   --   CKTOTAL  --   --  137  TROPONINIHS 101* 369* 1,444*    Estimated Creatinine Clearance: 34.7 mL/min (A) (by C-G formula based on SCr of 1.72 mg/dL (H)).   Medical History: Past Medical History:  Diagnosis Date   Asthma    Bipolar 1 disorder (HCC)    Chronic pain    COPD (chronic obstructive pulmonary disease) (HCC)    Depression    GERD (gastroesophageal reflux disease)    History of heart attack    IBS (irritable bowel syndrome)    Myocardial infarction (HCC)    Panic attacks      Assessment: 49 year old female found unresponsive by mother, brought in by EMS. Patient took oxycodone and alprazolam per report from family member. Patient with small pupils in ED. Patient requiring intubation to maintain  airway. Troponin elevated, EKG with ST segment depression. Pharmacy consulted for heparin drip.  Goal of Therapy:  Heparin level 0.3-0.7 units/ml Monitor platelets by anticoagulation protocol: Yes   Plan:  Heparin 3200 unit bolus followed by heparin drip at 600 units/hr. HL at 2100. CBC daily while on heparin drip.  8/10:  Heparin gtt was d/c'd @ 1830 ,   Dr End wants to restart the drip due to increasing troponins.   Will restart heparin at previous rate of 600 units/hr but not give bolus. Will draw HL 6 hrs after restart.   Bettyjean Stefanski D, PharmD 08/12/2019,9:35 PM
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
## 2019-08-12 NOTE — ED Notes (Signed)
Dr. Darnelle Catalan titrated ketamine drip to 1mg /kg/hr d/t pt not being sedated enough at this time. Dr. prepping for central line placement.

## 2019-08-12 NOTE — ED Notes (Signed)
Family at bedside at this time with chaplain and Dr. Darnelle Catalan.

## 2019-08-12 NOTE — ED Notes (Signed)
Critical lab results:  Lactic acid 5.0 Troponin 101  MD malinda made aware

## 2019-08-12 NOTE — ED Notes (Signed)
Unable to complete full neuro checks at this time due to dr. Darnelle Catalan at bedside working on obtaining central line access.

## 2019-08-12 NOTE — ED Provider Notes (Addendum)
[facility] Emergency Department Provider Note   ____________________________________________   First MD Initiated Contact with Patient 08/12/19 [phone]     (approximate)  I have reviewed the triage vital signs and the nursing notes.   HISTORY  Chief Complaint Altered Mental Status  History limited by unresponsiveness  HPI Debbie Gay is a 49 y.o. female was found unresponsive.  Apparently she had taken oxycodone and alprazolam unknown amount sometime in the night.  EMS reports they gave her 2 of Narcan with no response she was hypoxic with an O2 sat in the 50s.  Bag-valve-mask was used 2 more of Narcan was given and patient's O2 sat went up into the 90s.  Here in the emergency room patient has very small but not pinpoint pupils she is breathing on her own and occasionally coughing.  She is otherwise completely unresponsive.         Past Medical History:  Diagnosis Date  . Asthma   . Bipolar 1 disorder ([place])   . Chronic pain   . COPD (chronic obstructive pulmonary disease) ([place])   . Depression   . GERD (gastroesophageal reflux disease)   . History of heart attack   . IBS (irritable bowel syndrome)   . Myocardial infarction ([place])   . Panic attacks     Patient Active Problem List   Diagnosis Date Noted  . Memory loss 05/26/2019  . Anosmia 05/26/2019  . Syncope 03/04/2019  . Hyperglycemia 03/04/2019  . Leukocytosis 03/04/2019  . Acute respiratory failure with hypoxia ([place]) 03/04/2019  . SIRS (systemic inflammatory response syndrome) ([place]) 03/04/2019  . Decreased responsiveness   . Nonintractable headache 04/30/2018  . Confusion 04/16/2018  . Vitamin B1 deficiency 04/16/2018  . Gait abnormality 12/13/2017  . Paresthesia 12/13/2017  . Altered mental status, unspecified 11/30/2017  . HLD (hyperlipidemia) 11/29/2017  . Tobacco abuse 11/29/2017  . CAD (coronary artery disease) 11/29/2017  . Acute metabolic encephalopathy 26/83/4196  . COPD  (chronic obstructive pulmonary disease) ([place])   . Asthma   . GERD (gastroesophageal reflux disease)   . Panic attacks   . Bipolar 1 disorder ([place])   . Anxiety and depression   . Hypokalemia 11/22/2017  . Nausea and vomiting 11/21/2017  . Intractable nausea and vomiting 11/21/2017    Past Surgical History:  Procedure Laterality Date  . ABDOMINAL HYSTERECTOMY    . COLONOSCOPY WITH PROPOFOL N/A 10/30/2014   Procedure: COLONOSCOPY WITH PROPOFOL;  Surgeon: Lollie Sails, MD;  Location: [facility] ENDOSCOPY;  Service: Endoscopy;  Laterality: N/A;  . COLONOSCOPY WITH PROPOFOL N/A 02/05/2018   Procedure: COLONOSCOPY WITH PROPOFOL;  Surgeon: Lollie Sails, MD;  Location: [facility] ENDOSCOPY;  Service: Endoscopy;  Laterality: N/A;  . ESOPHAGOGASTRODUODENOSCOPY (EGD) WITH PROPOFOL N/A 10/30/2014   Procedure: ESOPHAGOGASTRODUODENOSCOPY (EGD) WITH PROPOFOL;  Surgeon: Lollie Sails, MD;  Location: [facility] ENDOSCOPY;  Service: Endoscopy;  Laterality: N/A;  . ESOPHAGOGASTRODUODENOSCOPY (EGD) WITH PROPOFOL N/A 11/23/2017   Procedure: ESOPHAGOGASTRODUODENOSCOPY (EGD) WITH PROPOFOL;  Surgeon: Tol[place], Benay Pike, MD;  Location: [facility] ENDOSCOPY;  Service: Gastroenterology;  Laterality: N/A;    Prior to Admission medications   Medication Sig Start Date End Date Taking? Authorizing Provider  ALPRAZolam Duanne Moron) 0.5 MG tablet Take 0.5 mg by mouth daily.    Yes [provider]  KLOR-CON M10 10 MEQ tablet Take 20 mEq by mouth 2 (two) times daily. 07/13/19  Yes [provider]  metoCLOPramide (REGLAN) 10 MG tablet Take 10 mg by mouth every 6 (six) hours  as needed for nausea or vomiting.    Yes [provider]  Oxycodone HCl 10 MG TABS Take 10 mg by mouth 4 (four) times daily.    Yes [provider]  pantoprazole (PROTONIX) 40 MG tablet Take 40 mg by mouth 2 (two) times daily.   Yes [provider]  QUEtiapine (SEROQUEL) 100 MG tablet Take 50-100 mg by mouth at bedtime.  05/03/19  Yes [provider]  thiamine (VITAMIN B-1) 100 MG tablet Take 1 tablet (100 mg total) by mouth daily. 12/02/17  Yes Thurnell Lose, MD    Allergies Levaquin [levofloxacin], Prednisone, and Penicillins  Family History  Problem Relation Age of Onset  . COPD Mother   . Irritable bowel syndrome Mother   . Glaucoma Father   . Hyperlipidemia Father     Social History Social History   Tobacco Use  . Smoking status: Current Every Day Smoker    Packs/day: 0.50    Years: 20.00    Pack years: 10.00  . Smokeless tobacco: Never Used  Vaping Use  . Vaping Use: Former  Substance Use Topics  . Alcohol use: No  . Drug use: No    Review of Systems Unable to obtain due to unresponsiveness  ____________________________________________   PHYSICAL EXAM:  VITAL SIGNS: ED Triage Vitals  Enc Vitals Group     BP      Pulse      Resp      Temp      Temp src      SpO2      Weight      Height      Head Circumference      Peak Flow      Pain Score      Pain Loc      Pain Edu?      Excl. in [place]?     Constitutional: Unresponsive Eyes: Conjunctivae are normal.  Pupils are small Head: Atraumatic. Nose: No congestion/rhinnorhea. Mouth/Throat: Mucous membranes are moist.  Oropharynx non-erythematous. Neck: No stridor.  Cardiovascular: Normal rate, regular rhythm. Grossly normal heart sounds.  Good peripheral circulation. Respiratory: Normal respiratory effort.  No retractions. Lungs CTAB. Gastrointestinal: Soft and nontender. No distention. No abdominal bruits.  Musculoskeletal: No lower extremity tenderness nor edema.   Neurologic: Unresponsive Skin:  Skin is warm, dry and intact. No rash noted.   ____________________________________________   LABS (all labs ordered are listed, but only abnormal results are displayed)  Labs Reviewed  ACETAMINOPHEN LEVEL - Abnormal; Notable for the following components:      Result Value   Acetaminophen (Tylenol), Serum  <10 (*)    All other components within normal limits  COMPREHENSIVE METABOLIC PANEL - Abnormal; Notable for the following components:   Glucose, Bld 278 (*)    Creatinine, Ser 1.72 (*)    Calcium 8.3 (*)    Total Protein 6.3 (*)    Albumin 3.4 (*)    AST 55 (*)    Total Bilirubin 1.3 (*)    GFR calc non Af Amer 35 (*)    GFR calc Af Amer 40 (*)    All other components within normal limits  LACTIC ACID, PLASMA - Abnormal; Notable for the following components:   Lactic Acid, Venous 5.0 (*)    All other components within normal limits  LACTIC ACID, PLASMA - Abnormal; Notable for the following components:   Lactic Acid, Venous 2.5 (*)    All other components within normal limits  CBC  WITH DIFFERENTIAL/PLATELET - Abnormal; Notable for the following components:   WBC 12.9 (*)    Neutro Abs 11.2 (*)    Abs Immature Granulocytes 0.34 (*)    All other components within normal limits  URINALYSIS, COMPLETE (UACMP) WITH MICROSCOPIC - Abnormal; Notable for the following components:   Color, Urine YELLOW (*)    APPearance HAZY (*)    Glucose, UA >=500 (*)    Hgb urine dipstick MODERATE (*)    Protein, ur 30 (*)    Bacteria, UA MANY (*)    All other components within normal limits  URINE DRUG SCREEN, QUALITATIVE ([facility] ONLY) - Abnormal; Notable for the following components:   Opiate, Ur Screen POSITIVE (*)    Benzodiazepine, Ur Scrn POSITIVE (*)    All other components within normal limits  GLUCOSE, CAPILLARY - Abnormal; Notable for the following components:   Glucose-Capillary 242 (*)    All other components within normal limits  PROTIME-INR - Abnormal; Notable for the following components:   Prothrombin Time 16.8 (*)    INR 1.4 (*)    All other components within normal limits  TROPONIN I (HIGH SENSITIVITY) - Abnormal; Notable for the following components:   Troponin I (High Sensitivity) 101 (*)    All other components within normal limits  TROPONIN I (HIGH SENSITIVITY) - Abnormal;  Notable for the following components:   Troponin I (High Sensitivity) 369 (*)    All other components within normal limits  SARS CORONAVIRUS 2 BY RT PCR (HOSPITAL ORDER, [place] [facility])  CULTURE, BLOOD (SINGLE)  URINE CULTURE  ETHANOL  PREGNANCY, URINE  APTT  PROCALCITONIN  CBG MONITORING, ED  POC URINE PREG, ED   ____________________________________________  EKG EKG read interpreted by me shows sinus tachycardia rate of 107 normal axis some ST segment depression in several leads. EKG #2 read interpreted by me shows sinus rhythm at 88 normal axis same axis is #1 ST segment depression has resolved there is a flipped T wave in V3 but I do not see any other changes the baseline is still very irregular. ____________________________________________  RADIOLOGY  ED MD interpretation: Initial chest x-ray read by radiology reviewed by me shows some atelectasis at the left base.  Official radiology report(s): DG Chest Port 1 View  Result Date: 08/12/2019 CLINICAL DATA:  Altered mental status EXAM: PORTABLE CHEST 1 VIEW COMPARISON:  08/12/2019 FINDINGS: Endotracheal tube terminates 3.8 cm above the carina. Enteric tube distal tip and side hole within the proximal stomach. Normal heart size. Mild bibasilar opacities with improving aeration of the left lung base. No pleural effusion or pneumothorax. Prior ORIF of multiple lower left ribs. IMPRESSION: 1. Support lines and tubes in satisfactory position. 2. Mild bibasilar opacities with improving aeration of the left lung base. Electronically Signed   By: Davina Poke D.O.   On: 08/12/2019 11:16   DG Chest Portable 1 View  Result Date: 08/12/2019 CLINICAL DATA:  Hypoxia EXAM: PORTABLE CHEST 1 VIEW COMPARISON:  August 12, 2019 study obtained earlier in the day FINDINGS: Endotracheal tube tip is 3.5 cm above the carina. Nasogastric tube tip and side port are below the diaphragm. No pneumothorax. There is mild left base  atelectasis. The lungs elsewhere are clear. Heart size and pulmonary vascularity are normal. No adenopathy. No bone lesions. IMPRESSION: Tube positions as described without pneumothorax. Left base atelectasis. Lungs otherwise clear. Cardiac silhouette normal. Electronically Signed   By: Lowella Grip III M.D.   On: 08/12/2019 10:24  DG Chest Portable 1 View  Result Date: 08/12/2019 CLINICAL DATA:  Unresponsive. EXAM: PORTABLE CHEST 1 VIEW COMPARISON:  03/04/2019 FINDINGS: 0937 hours. Mild hyperexpansion. The cardiopericardial silhouette is within normal limits for size. Interstitial markings are diffusely coarsened with chronic features. Streaky opacity at the left base suggest atelectasis. The lungs are otherwise clear without focal pneumonia, edema, pneumothorax or pleural effusion. The visualized bony structures of the thorax show now acute abnormality. Telemetry leads overlie the chest. IMPRESSION: Hyperexpansion with chronic interstitial changes and left base atelectasis. Electronically Signed   By: Misty Stanley M.D.   On: 08/12/2019 09:58    ____________________________________________   PROCEDURES  Procedure(s) performed (including Critical Care): Patient with no gag reflex.  She is very sedated.  We will intubate her for airway protection.  She was given 100 mg of succinyl IV and then intubated using a glide scope with a 7.5 ET tube.  I visualized the tube going through the cords.  Balloon was inflated she had good bilateral breath sounds and not in her stomach.  Then we used the glide scope to intubate the esophagus with a OG tube.  This was passed down to about 43 cm where we met resistance.  Injection of air into the tube resulted in gurgling in the epigastric area.  We are getting a chest x-ray to see if both tubes are in the proper position.  The ET tube is at 21 at the lips.  Plan is to extubate her when she wakes up  Intensivist wishes a central line.  Discussed the procedure with  family.  Patient is sedated therefore I do not use local.  Patient prepped and draped in usual sterile fashion.  Needle inserted under ultrasound guidance the artery is over the vein throughout the course of the vessel that I can see.  I hit the artery in spite of trying to go around it I withdraw cannot get around the artery.  Tried to cannulate the central line in the neck.  Again under ultrasound guidance I am finally able to get the blood return.  Catheter threads nicely with good blood return. Procedures  Critical care time 2.5hours.  This includes a good deal of time spent at the patient's bedside when she originally got here and putting in orders and then afterwards intermittent bedside visits every 10 or 15 minutes.  Additionally I reviewed her old records spoke to the hospitalist briefly when I thought I could extubate her and then I want to speak with the intensivist.   _----------------------------------------- 1:26 PM on 08/12/2019 -----------------------------------------  Continuing critical care time description of the 2 hours: I spoke with the sister and the fianc.  Ellene Route tells me she only had access to 3 days worth of medicines.  Sister says she had something like this when she was low on thiamine.  She has severe malnutrition does not eat enough and that is how she got low on thiamine.  I also spoke with the intensivist who wanted me to call cardiology.  Have called the cardiologist and let him know about the EKG with the ST segment depression on the new EKG which shows resolution of those.  Also about the troponin.  ___________________________________________   INITIAL IMPRESSION / ASSESSMENT AND PLAN / ED COURSE  ----------------------------------------- 12:30 PM on 08/12/2019 -----------------------------------------  Patient wakes up enough that she wanted to but not enough to respond to [place].  I have given her ketamine 1 other bolus.  When she comes back from CT  she is again  chewing on the tube but otherwise unresponsive.  I will start her on a ketamine drip.  Her troponin is gone up.  Her procalcitonin is positive.  I have ordered antibiotics for her already.  I will talk to the intensivist as soon as they call me back.  We are just paging them now that have got the CT back.  To my view the CT does not appear to show anything acute we will wait for the radiologist reading though.    ----------------------------------------- 12:59 PM on 08/12/2019 -----------------------------------------  Radiologist also does not see anything on the CT scan.  I spoke with the patient's sister and fianc.  Ellene Route is managing the medicines except for about 3 days worth of meds.  Sister reports she had something similar 1 time and was found to be thiamine deficient.  She woke up after thiamine.  We will order banana bag for her to get her thiamine multivitamins and folate.  She does have a history of malnutrition and this was what caused the thiamine deficiency before.  Patient is currently arousable and can answer questions but goes back to sleep almost immediately.  I asked the patient if she wanted to be extubated and she shook her head no.  We will leave the ET tube in for now.    ----------------------------------------- 1:21 PM on 08/12/2019 -----------------------------------------  Discussed with intensivist.  Will call cardiology.  Now that the CT is negative we will give her some heparin and aspirin.  Intensivist also asked for central line.  We will try and get one in her.      ____________________________________________   FINAL CLINICAL IMPRESSION(S) / ED DIAGNOSES  Final diagnoses:  Altered mental status, unspecified altered mental status type  Hypotension, unspecified hypotension type  Elevated troponin     ED Discharge Orders    None       Note:  This document was prepared using Dragon voice recognition software and may include unintentional dictation  errors.    Nena Polio, MD 08/12/19 1300    Nena Polio, MD 08/12/19 1322    Nena Polio, MD 08/12/19 1500 Chest x-ray post central line placement read by radiology as no pneumothorax I reviewed the film and will the for to the radiologist reading   Nena Polio, MD 08/12/19 [phone]

## 2019-08-12 NOTE — Consult Note (Signed)
Cardiology Consultation:   Patient ID: Debbie Gay MRN: [id number]; DOB: 07-28-1970  Admit date: 08/12/2019 Date of Consult: 08/12/2019  Primary Care Provider: Nicolasa DuckingGarlick, William, MD [facility] Cardiologist: New - Kenlei Safi [facility] Electrophysiologist:  None    Patient Profile:   Debbie Gay is a 49 y.o. female with a hx of "heart attack" (details unknown), COPD, IBS, bipolar disorder, and depression/anxiety, who is being seen today for the evaluation of abnormal EKG and elevated troponin at the request of Dr. Juliette AlcideMelinda.  History of Present Illness:   Debbie Gay is currently intubated and sedated; history obtained from the chart and the patient's sister.  She has a history of suspected drug overdoses and was found unresponsive at home this morning.  She was last seen in her normal state around 9 PM last night.  Oxycodone and alprazolam pills that she has been prescribed could not be found and presumably were ingested.  By report, the patient did not respond to Narcan.  On arrival in the emergency department she was hypotensive, requiring initiation of vasopressors.  She was also intubated for airway protection.  Per her sister, Debbie Gay had been feeling relatively well.  She had not complained of any chest pain or shortness of breath.  Her sister does not know of any underlying cardiac problems.  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  She wonders if some underlying heart condition could have contributed to her sister's unresponsiveness today.   Past Medical History:  Diagnosis Date  . Asthma   . Bipolar 1 disorder (HCC)   . Chronic pain   . COPD (chronic obstructive pulmonary disease) (HCC)   . Depression   . GERD (gastroesophageal reflux disease)   . History of heart attack   . IBS (irritable bowel syndrome)   . Myocardial  infarction (HCC)   . Panic attacks     Past Surgical History:  Procedure Laterality Date  . ABDOMINAL HYSTERECTOMY    . COLONOSCOPY WITH PROPOFOL N/A 10/30/2014   Procedure: COLONOSCOPY WITH PROPOFOL;  Surgeon: Christena DeemMartin U Skulskie, MD;  [facility];  Service: Endoscopy;  Laterality: N/A;  . COLONOSCOPY WITH PROPOFOL N/A 02/05/2018   Procedure: COLONOSCOPY WITH PROPOFOL;  Surgeon: Christena DeemSkulskie, Martin U, MD;  [facility];  Service: Endoscopy;  Laterality: N/A;  . ESOPHAGOGASTRODUODENOSCOPY (EGD) WITH PROPOFOL N/A 10/30/2014   Procedure: ESOPHAGOGASTRODUODENOSCOPY (EGD) WITH PROPOFOL;  Surgeon: Christena DeemMartin U Skulskie, MD;  [facility];  Service: Endoscopy;  Laterality: N/A;  . ESOPHAGOGASTRODUODENOSCOPY (EGD) WITH PROPOFOL N/A 11/23/2017   Procedure: ESOPHAGOGASTRODUODENOSCOPY (EGD) WITH PROPOFOL;  Surgeon: Toledo, Boykin Nearingeodoro K, MD;  Location: [facility] ENDOSCOPY;  Service: Gastroenterology;  Laterality: N/A;     Home Medications:  Prior to Admission medications   Medication Sig Start Date [name] Date Taking? Authorizing Provider  ALPRAZolam Prudy Feeler(XANAX) 0.5 MG tablet Take 0.5 mg by mouth daily.    Yes [provider]  KLOR-CON M10 10 MEQ tablet Take 20 mEq by mouth 2 (two) times daily. 07/13/19  Yes [provider]  metoCLOPramide (REGLAN) 10 MG tablet Take 10 mg by mouth every 6 (six) hours as needed for nausea or vomiting.    Yes [provider]  Oxycodone HCl 10 MG TABS Take 10 mg by mouth 4 (four) times daily.    Yes [provider]  pantoprazole (PROTONIX) 40 MG  tablet Take 40 mg by mouth 2 (two) times daily.   Yes [provider]  QUEtiapine (SEROQUEL) 100 MG tablet Take 50-100 mg by mouth at bedtime. 05/03/19  Yes [provider]  thiamine (VITAMIN B-1) 100 MG tablet Take 1 tablet (100 mg total) by mouth daily. 12/02/17  Yes Leroy Sea, MD    Inpatient Medications: Scheduled Meds:  Continuous Infusions: .  heparin 600 Units/hr (08/12/19 1501)  . ketamine (KETALAR) Adult IV Infusion 0.5 mg/kg/hr (08/12/19 1602)  . norepinephrine (LEVOPHED) Adult infusion 22 mcg/min (08/12/19 1304)   PRN Meds:   Allergies:    Allergies  Allergen Reactions  . Levaquin [Levofloxacin] Swelling  . Prednisone Shortness Of Breath and Swelling  . Penicillins     Has patient had a PCN reaction causing immediate rash, facial/tongue/throat swelling, SOB or lightheadedness with hypotension: Unknown Has patient had a PCN reaction causing severe rash involving mucus membranes or skin necrosis: Unknown Has patient had a PCN reaction that required hospitalization: Unknown Has patient had a PCN reaction occurring within the last 10 years: Unknown If all of the above answers are "NO", then may proceed with Cephalosporin use.    Social History:   Social History   Tobacco Use  . Smoking status: Current Every Day Smoker    Packs/day: 0.50    Years: 20.00    Pack years: 10.00  . Smokeless tobacco: Never Used  Vaping Use  . Vaping Use: Former  Substance Use Topics  . Alcohol use: No  . Drug use: No     Family History:   Family History  Problem Relation Age of Onset  . COPD Mother   . Irritable bowel syndrome Mother   . Glaucoma Father   . Hyperlipidemia Father      ROS:  Review of Systems  Unable to perform ROS: Intubated   Physical Exam/Data:   Vitals:   08/12/19 1600 08/12/19 1615 08/12/19 1630 08/12/19 1645  BP: 124/87 126/87 124/85 127/85  Pulse: 79 77 75 76  Resp: (!) 23 (!) 26 (!) 25 (!) 24  Temp: (!) 100.4 F (38 C) (!) 100.4 F (38 C) (!) 100.4 F (38 C) (!) 100.4 F (38 C)  TempSrc:      SpO2: 99% 100% 100% 99%  Weight:      Height:        Intake/Output Summary (Last 24 hours) at 08/12/2019 1657 Last data filed at 08/12/2019 1104 Gross per 24 hour  Intake 17.02 ml  Output --  Net 17.02 ml   Last 3 Weights 08/12/2019 05/26/2019 03/04/2019  Weight (lbs) 120 lb 127 lb 127 lb 6.8 oz   Weight (kg) 54.432 kg 57.607 kg 57.8 kg  Some encounter information is confidential and restricted. Go to Review Flowsheets activity to see all data.     Body mass index is 19.97 kg/m.  General: Thin woman, intubated and sedated.  Sister is at the bedside. HEENT: Endotracheal tube in place. Lymph: no adenopathy Neck: Right IJ central venous catheter in place.  Unable to assess JVP due to positioning. Endocrine:  No thryomegaly Vascular: 2+ radial and pedal pulses bilaterally. Cardiac:  normal S1, S2; RRR; no murmurs, rubs, or gallops Lungs:  clear to auscultation bilaterally, no wheezing, rhonchi or rales  Abd: soft, nontender, no hepatomegaly  Ext: no edema Musculoskeletal:  No deformities, BUE and BLE strength normal and equal Skin: warm and dry  Neuro: Intubated and sedated. Psych: Intubated and sedated.  EKG:  The EKG  was personally reviewed and demonstrates: Initial EKG today at 9:27 AM shows sinus tachycardia with nonspecific ST segment abnormality.  Most recent repeat EKG at 1:21 PM demonstrates normal sinus rhythm with less pronounced nonspecific ST segment changes. Telemetry:  Telemetry was personally reviewed and demonstrates:  Sinus rhythm.  Relevant CV Studies: Pharmacologic MPI [facility], 06-17-07): Normal study without ischemia or scar.  LVEF > 65%.  Laboratory Data:  High Sensitivity Troponin:   Recent Labs  Lab 08/12/19 0934 08/12/19 1122  TROPONINIHS 101* 369*     Chemistry Recent Labs  Lab 08/12/19 0934  NA 142  K 4.5  CL 105  CO2 22  GLUCOSE 278*  BUN 13  CREATININE 1.72*  CALCIUM 8.3*  GFRNONAA 35*  GFRAA 40*  ANIONGAP 15    Recent Labs  Lab 08/12/19 0934  PROT 6.3*  ALBUMIN 3.4*  AST 55*  ALT 29  ALKPHOS 69  BILITOT 1.3*   Hematology Recent Labs  Lab 08/12/19 0934  WBC 12.9*  RBC 4.41  HGB 14.1  HCT 44.1  MCV 100.0  MCH 32.0  MCHC 32.0  RDW 13.0  PLT 224   BNPNo results for input(s): BNP, PROBNP in the last 168 hours.   DDimer No results for input(s): DDIMER in the last 168 hours.   Radiology/Studies:  CT Head Wo Contrast  Result Date: 08/12/2019 CLINICAL DATA:  Mental status change. EXAM: CT HEAD WITHOUT CONTRAST TECHNIQUE: Contiguous axial images were obtained from the base of the skull through the vertex without intravenous contrast. COMPARISON:  08/04/2019 MRI head and prior. 03/04/2019 CT head and prior. FINDINGS: Brain: No acute infarct or intracranial hemorrhage. No mass lesion. No midline shift, ventriculomegaly or extra-axial fluid collection. Vascular: No hyperdense vessel or unexpected calcification. Skull: Negative for fracture or focal lesion. Sinuses/Orbits: No acute orbital findings. Clear paranasal sinuses. No mastoid effusion. Other: None. IMPRESSION: No acute intracranial pathology. Electronically Signed   By: Stana Bunting M.D.   On: 08/12/2019 12:36   DG Chest Portable 1 View  Result Date: 08/12/2019 CLINICAL DATA:  Hypoxia.  Central catheter placement EXAM: PORTABLE CHEST 1 VIEW COMPARISON:  August 12, 2019 study obtained earlier in the day FINDINGS: Central catheter tip is in the superior vena cava. Endotracheal tube tip is 4.4 cm above the carina. Nasogastric tube tip and side port are below the diaphragm. No pneumothorax. There is no edema or airspace opacity. Heart size and pulmonary vascularity are normal. No adenopathy. Postoperative changes noted in several ribs on the left inferiorly, stable. IMPRESSION: Tube and catheter positions as described without pneumothorax. No edema or airspace opacity. Stable cardiac silhouette. Electronically Signed   By: Bretta Bang III M.D.   On: 08/12/2019 15:41   DG Chest Port 1 View  Result Date: 08/12/2019 CLINICAL DATA:  Altered mental status EXAM: PORTABLE CHEST 1 VIEW COMPARISON:  08/12/2019 FINDINGS: Endotracheal tube terminates 3.8 cm above the carina. Enteric tube distal tip and side hole within the proximal stomach. Normal heart size.  Mild bibasilar opacities with improving aeration of the left lung base. No pleural effusion or pneumothorax. Prior ORIF of multiple lower left ribs. IMPRESSION: 1. Support lines and tubes in satisfactory position. 2. Mild bibasilar opacities with improving aeration of the left lung base. Electronically Signed   By: Duanne Guess D.O.   On: 08/12/2019 11:16   DG Chest Portable 1 View  Result Date: 08/12/2019 CLINICAL DATA:  Hypoxia EXAM: PORTABLE CHEST 1 VIEW COMPARISON:  August 12, 2019 study obtained earlier in  the day FINDINGS: Endotracheal tube tip is 3.5 cm above the carina. Nasogastric tube tip and side port are below the diaphragm. No pneumothorax. There is mild left base atelectasis. The lungs elsewhere are clear. Heart size and pulmonary vascularity are normal. No adenopathy. No bone lesions. IMPRESSION: Tube positions as described without pneumothorax. Left base atelectasis. Lungs otherwise clear. Cardiac silhouette normal. Electronically Signed   By: Bretta Bang III M.D.   On: 08/12/2019 10:24   DG Chest Portable 1 View  Result Date: 08/12/2019 CLINICAL DATA:  Unresponsive. EXAM: PORTABLE CHEST 1 VIEW COMPARISON:  03/04/2019 FINDINGS: 0937 hours. Mild hyperexpansion. The cardiopericardial silhouette is within normal limits for size. Interstitial markings are diffusely coarsened with chronic features. Streaky opacity at the left base suggest atelectasis. The lungs are otherwise clear without focal pneumonia, edema, pneumothorax or pleural effusion. The visualized bony structures of the thorax show now acute abnormality. Telemetry leads overlie the chest. IMPRESSION: Hyperexpansion with chronic interstitial changes and left base atelectasis. Electronically Signed   By: Kennith Center M.D.   On: 08/12/2019 09:58    Assessment and Plan:   Elevated troponin and abnormal EKG: I suspect elevated troponin and EKG abnormalities reflect supply-demand mismatch in the setting of marked hypoxia  and hypotension after the patient was found unresponsive.  Presumably, this was caused by drug overdose though underlying arrhythmia cannot be entirely excluded.  Patient remains on norepinephrine for blood pressure support at this time.  She also has a low-grade fever, raising the potential for underlying infection.  I do not recommend urgent cardiac catheterization at this time.  It is reasonable to trend her troponin until it has peaked, then stop.  Unless she has a marked increase in her high-sensitivity troponin I or other objective findings consistent with ongoing myocardial ischemia, I recommend deferring heparin infusion.  It is reasonable to continue low-dose aspirin.  Beta-blocker is precluded by hypotension.  I recommend checking a total CK to exclude rhabdomyolysis.  Statin therapy will be deferred pending this result.  We will obtain a transthoracic echocardiogram.  Hypotension: Likely multifactorial but driven primarily by drug overdose and possible component of sepsis.  Continue norepinephrine with ongoing management per the ED and critical care team.  For questions or updates, please contact [facility] Please consult [email or web address] for contact info under [facility] Cardiology.  Signed, Yvonne Kendall, MD  08/12/2019 4:57 PM

## 2019-08-12 NOTE — Progress Notes (Signed)
Pt transported to and from CT with RN and RT without incident.  

## 2019-08-13 ENCOUNTER — Inpatient Hospital Stay (HOSPITAL_COMMUNITY)
Admit: 2019-08-13 | Discharge: 2019-08-13 | Disposition: A | Discharge status: Home / Self Care | Payer: Medicaid Other | Attending: Internal Medicine | Admitting: Internal Medicine

## 2019-08-13 DIAGNOSIS — J9601 Acute respiratory failure with hypoxia: Secondary | ICD-10-CM

## 2019-08-13 DIAGNOSIS — R9431 Abnormal electrocardiogram [ECG] [EKG]: Secondary | ICD-10-CM

## 2019-08-13 DIAGNOSIS — E876 Hypokalemia: Secondary | ICD-10-CM

## 2019-08-13 DIAGNOSIS — R778 Other specified abnormalities of plasma proteins: Secondary | ICD-10-CM

## 2019-08-13 LAB — PROCALCITONIN: Procalcitonin: 1.88 ng/mL

## 2019-08-13 LAB — GLUCOSE, CAPILLARY
Glucose-Capillary: 110 mg/dL — ABNORMAL HIGH (ref 70–99)
Glucose-Capillary: 115 mg/dL — ABNORMAL HIGH (ref 70–99)
Glucose-Capillary: 165 mg/dL — ABNORMAL HIGH (ref 70–99)
Glucose-Capillary: 76 mg/dL (ref 70–99)
Glucose-Capillary: 83 mg/dL (ref 70–99)

## 2019-08-13 LAB — COMPREHENSIVE METABOLIC PANEL
ALT: 25 U/L (ref 0–44)
AST: 34 U/L (ref 15–41)
Albumin: 2.8 g/dL — ABNORMAL LOW (ref 3.5–5.0)
Alkaline Phosphatase: 49 U/L (ref 38–126)
Anion gap: 9 (ref 5–15)
BUN: 19 mg/dL (ref 6–20)
CO2: 21 mmol/L — ABNORMAL LOW (ref 22–32)
Calcium: 7.6 mg/dL — ABNORMAL LOW (ref 8.9–10.3)
Chloride: 111 mmol/L (ref 98–111)
Creatinine, Ser: 0.95 mg/dL (ref 0.44–1.00)
GFR calc Af Amer: >60 mL/min (ref >60)
GFR calc non Af Amer: >60 mL/min (ref >60)
Glucose, Bld: 99 mg/dL (ref 70–99)
Potassium: 3.2 mmol/L — ABNORMAL LOW (ref 3.5–5.1)
Sodium: 141 mmol/L (ref 135–145)
Total Bilirubin: 1.2 mg/dL (ref 0.3–1.2)
Total Protein: 5 g/dL — ABNORMAL LOW (ref 6.5–8.1)

## 2019-08-13 LAB — CBC WITH DIFFERENTIAL/PLATELET
Abs Immature Granulocytes: 0.03 10*3/uL (ref 0.00–0.07)
Basophils Absolute: 0 10*3/uL (ref 0.0–0.1)
Basophils Relative: 0 %
Eosinophils Absolute: 0 10*3/uL (ref 0.0–0.5)
Eosinophils Relative: 0 %
HCT: 34.9 % — ABNORMAL LOW (ref 36.0–46.0)
Hemoglobin: 11.7 g/dL — ABNORMAL LOW (ref 12.0–15.0)
Immature Granulocytes: 0 %
Lymphocytes Relative: 24 %
Lymphs Abs: 2.1 10*3/uL (ref 0.7–4.0)
MCH: 32.4 pg (ref 26.0–34.0)
MCHC: 33.5 g/dL (ref 30.0–36.0)
MCV: 96.7 fL (ref 80.0–100.0)
Monocytes Absolute: 0.6 10*3/uL (ref 0.1–1.0)
Monocytes Relative: 7 %
Neutro Abs: 5.9 10*3/uL (ref 1.7–7.7)
Neutrophils Relative %: 69 %
Platelets: 186 10*3/uL (ref 150–400)
RBC: 3.61 MIL/uL — ABNORMAL LOW (ref 3.87–5.11)
RDW: 13.2 % (ref 11.5–15.5)
WBC: 8.7 10*3/uL (ref 4.0–10.5)
nRBC: 0 % (ref 0.0–0.2)

## 2019-08-13 LAB — ECHOCARDIOGRAM COMPLETE
AR max vel: 1.77 cm2
AV Area VTI: 2.3 cm2
AV Area mean vel: 1.79 cm2
AV Mean grad: 4.3 mmHg
AV Peak grad: 8.4 mmHg
Ao pk vel: 1.45 m/s
Area-P 1/2: 5.27 cm2
Height: 65 in
S' Lateral: 3.61 cm
Weight: 2052.92 oz

## 2019-08-13 LAB — LACTIC ACID, PLASMA
Lactic Acid, Venous: 0.7 mmol/L (ref 0.5–1.9)
Lactic Acid, Venous: 0.8 mmol/L (ref 0.5–1.9)

## 2019-08-13 LAB — MAGNESIUM: Magnesium: 1.5 mg/dL — ABNORMAL LOW (ref 1.7–2.4)

## 2019-08-13 LAB — MRSA PCR SCREENING: MRSA by PCR: NEGATIVE

## 2019-08-13 LAB — TRIGLYCERIDES: Triglycerides: 68 mg/dL (ref <150)

## 2019-08-13 LAB — HEPARIN LEVEL (UNFRACTIONATED)
Heparin Unfractionated: 0.25 IU/mL — ABNORMAL LOW (ref 0.30–0.70)
Heparin Unfractionated: 0.35 IU/mL (ref 0.30–0.70)

## 2019-08-13 LAB — TROPONIN I (HIGH SENSITIVITY): Troponin I (High Sensitivity): 1205 ng/L (ref <18)

## 2019-08-13 MED ORDER — LACTATED RINGERS IV SOLN: INTRAVENOUS | Status: DC

## 2019-08-13 MED ORDER — POTASSIUM CHLORIDE 10 MEQ/50ML IV SOLN
10.0000 meq | INTRAVENOUS | Status: AC
  Administered 2019-08-13 (×6): 10 meq via INTRAVENOUS
  Filled 2019-08-13 (×6): qty 50

## 2019-08-13 MED ORDER — CHLORHEXIDINE GLUCONATE CLOTH 2 % EX PADS
6.0000 | MEDICATED_PAD | Freq: Every day | CUTANEOUS | Status: DC
  Administered 2019-08-13: 6 via TOPICAL

## 2019-08-13 MED ORDER — CHLORHEXIDINE GLUCONATE 0.12% ORAL RINSE (MEDLINE KIT)
15.0000 mL | Freq: Two times a day (BID) | OROMUCOSAL | Status: DC
  Administered 2019-08-13 (×2): 15 mL via OROMUCOSAL

## 2019-08-13 MED ORDER — SODIUM CHLORIDE 0.9 % IV SOLN
2.0000 g | Freq: Three times a day (TID) | INTRAVENOUS | Status: DC
  Administered 2019-08-13 – 2019-08-14 (×3): 2 g via INTRAVENOUS
  Filled 2019-08-13 (×5): qty 2

## 2019-08-13 MED ORDER — VITAMIN B-12 1000 MCG PO TABS
5000.0000 ug | ORAL_TABLET | Freq: Every day | ORAL | Status: DC
  Administered 2019-08-13 – 2019-08-16 (×4): 5000 ug via ORAL
  Filled 2019-08-13 (×4): qty 5

## 2019-08-13 MED ORDER — PROPOFOL 1000 MG/100ML IV EMUL
5.0000 ug/kg/min | INTRAVENOUS | Status: DC
  Administered 2019-08-13: 60 ug/kg/min via INTRAVENOUS
  Filled 2019-08-13: qty 100

## 2019-08-13 MED ORDER — DEXTROSE IN LACTATED RINGERS 5 % IV SOLN: INTRAVENOUS | Status: DC

## 2019-08-13 MED ORDER — NICOTINE 14 MG/24HR TD PT24
14.0000 mg | MEDICATED_PATCH | Freq: Every day | TRANSDERMAL | Status: DC
  Administered 2019-08-13 – 2019-08-16 (×4): 14 mg via TRANSDERMAL
  Filled 2019-08-13 (×4): qty 1

## 2019-08-13 MED ORDER — PROPOFOL 1000 MG/100ML IV EMUL
INTRAVENOUS | Status: AC
  Administered 2019-08-13: 20 ug/kg/min via INTRAVENOUS
  Filled 2019-08-13: qty 100

## 2019-08-13 MED ORDER — ACETAMINOPHEN 325 MG PO TABS
650.0000 mg | ORAL_TABLET | Freq: Four times a day (QID) | ORAL | Status: DC | PRN
  Administered 2019-08-13 – 2019-08-14 (×4): 650 mg via ORAL
  Filled 2019-08-13 (×4): qty 2

## 2019-08-13 MED ORDER — HEPARIN (PORCINE) 25000 UT/250ML-% IV SOLN
850.0000 [IU]/h | INTRAVENOUS | Status: DC
  Administered 2019-08-13: 750 [IU]/h via INTRAVENOUS
  Filled 2019-08-13: qty 250

## 2019-08-13 MED ORDER — MAGNESIUM SULFATE 4 GM/100ML IV SOLN
4.0000 g | Freq: Once | INTRAVENOUS | Status: AC
  Administered 2019-08-13: 4 g via INTRAVENOUS
  Filled 2019-08-13 (×2): qty 100

## 2019-08-13 MED ORDER — ORAL CARE MOUTH RINSE
15.0000 mL | OROMUCOSAL | Status: DC
  Administered 2019-08-13 – 2019-08-14 (×9): 15 mL via OROMUCOSAL

## 2019-08-13 NOTE — Progress Notes (Signed)
ANTICOAGULATION CONSULT NOTE  Pharmacy Consult for heparin Indication: chest pain/ACS  Patient Measurements: Height: 5\' 5"  (165.1 cm) Weight: 58.2 kg (128 lb 4.9 oz) IBW/kg (Calculated) : 57 Heparin Dosing Weight: 54 kg  Vital Signs: Temp: 99 F (37.2 C) (08/11 1400) BP: 113/92 (08/11 1600) Pulse Rate: 84 (08/11 1600)  Labs: Recent Labs    08/12/19 0934 08/12/19 0934 08/12/19 1122 08/12/19 1907 08/12/19 2113 08/13/19 0146 08/13/19 0430 08/13/19 1759  HGB 14.1  --   --   --   --   --  11.7*  --   HCT 44.1  --   --   --   --   --  34.9*  --   PLT 224  --   --   --   --   --  186  --   APTT  --   --  36  --   --   --   --   --   LABPROT  --   --  16.8*  --   --   --   --   --   INR  --   --  1.4*  --   --   --   --   --   HEPARINUNFRC  --   --   --   --  <0.10*  --  0.25* 0.35  CREATININE 1.72*  --   --   --   --   --  0.95  --   CKTOTAL  --   --   --  137  --   --   --   --   TROPONINIHS 101*   < > 369* 1,444*  --  1,205*  --   --    < > = values in this interval not displayed.    Estimated Creatinine Clearance: 65.9 mL/min (by C-G formula based on SCr of 0.95 mg/dL).   Medical History: Past Medical History:  Diagnosis Date   Asthma    Bipolar 1 disorder (HCC)    Chronic pain    COPD (chronic obstructive pulmonary disease) (HCC)    Depression    GERD (gastroesophageal reflux disease)    History of heart attack    IBS (irritable bowel syndrome)    Myocardial infarction (HCC)    Panic attacks      Assessment: 49 year old female found unresponsive by mother, brought in by EMS. Patient took oxycodone and alprazolam per report from family member. Patient with small pupils in ED. Patient requiring intubation to maintain airway. Troponin elevated, EKG with ST segment depression. Pharmacy consulted for heparin drip.The plan is to continue heparin for another 24 hours. She is currently extubated and in CCU. H&H, platelets trending down  Heparin  Course: 08/10 initiation: 3200 unit bolus, then 600 units/hr 08/11 0430 HL 0.25: inc to 750 units/hr 08/11 0905: heparin stopped for approximately 2 hours 08/11 1759 HL 0.35: therapeutic x 1  Goal of Therapy:  Heparin level 0.3-0.7 units/ml Monitor platelets by anticoagulation protocol: Yes   Plan:   Continue heparin at 750 units/hr   Recheck anti-Xa level ~ 0000 to confirm   CBC in am  continue to monitor.  10/11, PharmD 08/13/2019,7:00 PM

## 2019-08-13 NOTE — Progress Notes (Signed)
Progress Note  Patient Name: Debbie Gay Date of Encounter: 08/13/2019  Primary Cardiologist: New to [facility] - consult by End  Subjective   HS-Tn has peaked at 1444 and is now down trending. She has been started on a heparin gtt in this setting. She remained intubated, though is awake and alert. She shakes her head "no" to chest pain. She does note a sore throat and is asking for the ET tube to be removed. Potassium 3.2 and magnesium 1.5 this morning.   Inpatient Medications    Scheduled Meds: . aspirin EC  81 mg Oral Daily  . atorvastatin  40 mg Oral Daily  . chlorhexidine gluconate (MEDLINE KIT)  15 mL Mouth Rinse BID  . Chlorhexidine Gluconate Cloth  6 each Topical Daily  . insulin aspart  0-9 Units Subcutaneous Q4H  . mouth rinse  15 mL Mouth Rinse 10 times per day  . pantoprazole (PROTONIX) IV  40 mg Intravenous Q24H  . thiamine  100 mg Oral Daily   Continuous Infusions: . ceFEPime (MAXIPIME) IV    . heparin 750 Units/hr (08/13/19 0602)  . lactated ringers 100 mL/hr at 08/13/19 0602  . magnesium sulfate bolus IVPB    . norepinephrine (LEVOPHED) Adult infusion 2 mcg/min (08/13/19 0602)  . potassium chloride 10 mEq (08/13/19 0712)  . propofol (DIPRIVAN) infusion 60 mcg/kg/min (08/13/19 0601)   PRN Meds: docusate sodium, polyethylene glycol   Vital Signs    Vitals:   08/13/19 0500 08/13/19 0600 08/13/19 0700 08/13/19 0800  BP: 92/65 119/80 120/82 136/86  Pulse: 60 65 73 95  Resp: '18 18 18 18  ' Temp: 98.8 F (37.1 C) 98.4 F (36.9 C) 98.4 F (36.9 C) 99.1 F (37.3 C)  TempSrc:      SpO2: 100% 100% 100% 100%  Weight:      Height:        Intake/Output Summary (Last 24 hours) at 08/13/2019 0832 Last data filed at 08/13/2019 0602 Gross per 24 hour  Intake 2109.2 ml  Output 655 ml  Net 1454.2 ml   Filed Weights   08/12/19 0950 08/13/19 0200  Weight: 54.4 kg 58.2 kg    Telemetry    SR - Personally Reviewed  ECG    No new tracings - Personally  Reviewed  Physical Exam   GEN: Intubated. Awake and alert. Will shake her head yes/no to questions. No acute distress.   Neck: No JVD. Cardiac: RRR, no murmurs, rubs, or gallops.  Respiratory: Clear to auscultation bilaterally. Intubated.  GI: Soft, nontender, non-distended.   MS: No edema; No deformity. Neuro:  Alert; Nonfocal.  Psych: Normal affect.  Labs    Chemistry Recent Labs  Lab 08/12/19 0934 08/13/19 0430  NA 142 141  K 4.5 3.2*  CL 105 111  CO2 22 21*  GLUCOSE 278* 99  BUN 13 19  CREATININE 1.72* 0.95  CALCIUM 8.3* 7.6*  PROT 6.3* 5.0*  ALBUMIN 3.4* 2.8*  AST 55* 34  ALT 29 25  ALKPHOS 69 49  BILITOT 1.3* 1.2  GFRNONAA 35* >60  GFRAA 40* >60  ANIONGAP 15 9     Hematology Recent Labs  Lab 08/12/19 0934 08/13/19 0430  WBC 12.9* 8.7  RBC 4.41 3.61*  HGB 14.1 11.7*  HCT 44.1 34.9*  MCV 100.0 96.7  MCH 32.0 32.4  MCHC 32.0 33.5  RDW 13.0 13.2  PLT 224 186    Cardiac EnzymesNo results for input(s): TROPONINI in the last 168 hours. No results  for input(s): TROPIPOC in the last 168 hours.   BNPNo results for input(s): BNP, PROBNP in the last 168 hours.   DDimer No results for input(s): DDIMER in the last 168 hours.   Radiology    CT Head Wo Contrast  Result Date: 08/12/2019 IMPRESSION: No acute intracranial pathology. Electronically Signed   By: Primitivo Gauze M.D.   On: 08/12/2019 12:36   DG Chest Portable 1 View  Result Date: 08/12/2019 IMPRESSION: Tube and catheter positions as described without pneumothorax. No edema or airspace opacity. Stable cardiac silhouette. Electronically Signed   By: Lowella Grip III M.D.   On: 08/12/2019 15:41   DG Chest Port 1 View  Result Date: 08/12/2019 IMPRESSION: 1. Support lines and tubes in satisfactory position. 2. Mild bibasilar opacities with improving aeration of the left lung base. Electronically Signed   By: Davina Poke D.O.   On: 08/12/2019 11:16   DG Chest Portable 1  View  Result Date: 08/12/2019 IMPRESSION: Tube positions as described without pneumothorax. Left base atelectasis. Lungs otherwise clear. Cardiac silhouette normal. Electronically Signed   By: Lowella Grip III M.D.   On: 08/12/2019 10:24   DG Chest Portable 1 View  Result Date: 08/12/2019 IMPRESSION: Hyperexpansion with chronic interstitial changes and left base atelectasis. Electronically Signed   By: Misty Stanley M.D.   On: 08/12/2019 09:58    Cardiac Studies   2D echo pending  Patient Profile     49 y.o. female with history of "heart attack" (details unknown), COPD, IBS, bipolar disorder, and depression/anxiety, who is being seen today for the evaluation of abnormal EKG and elevated troponin.  Assessment & Plan    1. Elevated troponin: -HS-Tn peaked at 1444 and is now down trending -This certainly may still be supply demand ischemia in the setting of  -Heparin gtt for a total of 48 hours -2D echo pending -No indication for urgent LHC at this time -Will need ischemic evaluation as she improves from her respiratory failure with timing to be determined  -ASA -Lipitor -Beta blocker is precluded by hypotension requiring vasopressor support   -Check lipid panel for further risk stratification   2. Acute hypoxic respiratory failure with hypotension: -Suspected to be in the setting of opiates and benzodiazepines with notes indicating no SI -Sitter at bedside -Vent management deferred to PCCM   3. Hypokalemia/hypomagnesemia: -Replete to goal 4.0 and 2.0 respectively per ICU protocol   For questions or updates, please contact [facility] Please consult [email or web address] for contact info under Cardiology/STEMI.    Signed, Christell Faith, PA-C [place] Pager: [phone] 08/13/2019, 8:32 AM

## 2019-08-13 NOTE — Progress Notes (Signed)
ANTICOAGULATION CONSULT NOTE  Pharmacy Consult for heparin Indication: chest pain/ACS  Allergies  Allergen Reactions  . Levaquin [Levofloxacin] Swelling  . Prednisone Shortness Of Breath and Swelling  . Penicillins     Has patient had a PCN reaction causing immediate rash, facial/tongue/throat swelling, SOB or lightheadedness with hypotension: Unknown Has patient had a PCN reaction causing severe rash involving mucus membranes or skin necrosis: Unknown Has patient had a PCN reaction that required hospitalization: Unknown Has patient had a PCN reaction occurring within the last 10 years: Unknown If all of the above answers are "NO", then may proceed with Cephalosporin use.    Patient Measurements: Height: 5\' 5"  (165.1 cm) Weight: 58.2 kg (128 lb 4.9 oz) IBW/kg (Calculated) : 57 Heparin Dosing Weight: 54 kg  Vital Signs: Temp: 99.7 F (37.6 C) (08/11 0330) Temp Source: Bladder (08/11 0251) BP: 132/76 (08/11 0400) Pulse Rate: 63 (08/11 0330)  Labs: Recent Labs    08/12/19 0934 08/12/19 0934 08/12/19 1122 08/12/19 1907 08/12/19 2113 08/13/19 0146 08/13/19 0430  HGB 14.1  --   --   --   --   --  11.7*  HCT 44.1  --   --   --   --   --  34.9*  PLT 224  --   --   --   --   --  186  APTT  --   --  36  --   --   --   --   LABPROT  --   --  16.8*  --   --   --   --   INR  --   --  1.4*  --   --   --   --   HEPARINUNFRC  --   --   --   --  <0.10*  --  0.25*  CREATININE 1.72*  --   --   --   --   --  0.95  CKTOTAL  --   --   --  137  --   --   --   TROPONINIHS 101*   < > 369* 1,444*  --  1,205*  --    < > = values in this interval not displayed.    Estimated Creatinine Clearance: 65.9 mL/min (by C-G formula based on SCr of 0.95 mg/dL).   Medical History: Past Medical History:  Diagnosis Date  . Asthma   . Bipolar 1 disorder (HCC)   . Chronic pain   . COPD (chronic obstructive pulmonary disease) (HCC)   . Depression   . GERD (gastroesophageal reflux disease)   .  History of heart attack   . IBS (irritable bowel syndrome)   . Myocardial infarction (HCC)   . Panic attacks      Assessment: 49 year old female found unresponsive by mother, brought in by EMS. Patient took oxycodone and alprazolam per report from family member. Patient with small pupils in ED. Patient requiring intubation to maintain airway. Troponin elevated, EKG with ST segment depression. Pharmacy consulted for heparin drip.  Goal of Therapy:  Heparin level 0.3-0.7 units/ml Monitor platelets by anticoagulation protocol: Yes   Plan:  08/11 @ 0430 HL 0.25 subtherapeutic. Will increase rate to 750 units/hr and will recheck HL at 1100, CBC trending down will continue to monitor.  10/11, PharmD 08/13/2019,5:05 AM

## 2019-08-13 NOTE — Progress Notes (Signed)
*  PRELIMINARY RESULTS* Echocardiogram 2D Echocardiogram has been performed.  Cristela Blue 08/13/2019, 10:59 AM

## 2019-08-13 NOTE — Progress Notes (Signed)
Patients sedation stopped this am. Patient able to follow simple commands and breath spontaneously. Orders to extubate, patient placed on 2 liters, oxygenating 98%.

## 2019-08-13 NOTE — Progress Notes (Signed)
Pt was suctioned prior to extubation for a small amount of thick white secretions. Per Dr.'s order, she was extubated. She is voicing and is without stridor. She was placed on a 2 L nasal cannula.

## 2019-08-13 NOTE — Progress Notes (Signed)
ANTICOAGULATION CONSULT NOTE  Pharmacy Consult for heparin Indication: chest pain/ACS  Patient Measurements: Height: 5\' 5"  (165.1 cm) Weight: 58.2 kg (128 lb 4.9 oz) IBW/kg (Calculated) : 57 Heparin Dosing Weight: 54 kg  Vital Signs: Temp: 98.4 F (36.9 C) (08/11 0600) Temp Source: Bladder (08/11 0251) BP: 119/80 (08/11 0600) Pulse Rate: 65 (08/11 0600)  Labs: Recent Labs    08/12/19 0934 08/12/19 0934 08/12/19 1122 08/12/19 1907 08/12/19 2113 08/13/19 0146 08/13/19 0430  HGB 14.1  --   --   --   --   --  11.7*  HCT 44.1  --   --   --   --   --  34.9*  PLT 224  --   --   --   --   --  186  APTT  --   --  36  --   --   --   --   LABPROT  --   --  16.8*  --   --   --   --   INR  --   --  1.4*  --   --   --   --   HEPARINUNFRC  --   --   --   --  <0.10*  --  0.25*  CREATININE 1.72*  --   --   --   --   --  0.95  CKTOTAL  --   --   --  137  --   --   --   TROPONINIHS 101*   < > 369* 1,444*  --  1,205*  --    < > = values in this interval not displayed.    Estimated Creatinine Clearance: 65.9 mL/min (by C-G formula based on SCr of 0.95 mg/dL).   Medical History: Past Medical History:  Diagnosis Date  . Asthma   . Bipolar 1 disorder (HCC)   . Chronic pain   . COPD (chronic obstructive pulmonary disease) (HCC)   . Depression   . GERD (gastroesophageal reflux disease)   . History of heart attack   . IBS (irritable bowel syndrome)   . Myocardial infarction (HCC)   . Panic attacks      Assessment: 49 year old female found unresponsive by mother, brought in by EMS. Patient took oxycodone and alprazolam per report from family member. Patient with small pupils in ED. Patient requiring intubation to maintain airway. Troponin elevated, EKG with ST segment depression. Pharmacy consulted for heparin drip.The plan is to continue heparin for another 24 hours. She is currently extubated and in CCU. H&H, platelets trending down  Heparin Course: 08/10 initiation: 3200 unit  bolus, then 600 units/hr 08/10 0430 HL 0.25: inc to 750 units/hr 08/10 0905: heparin stopped for approximately 2 hours  Goal of Therapy:  Heparin level 0.3-0.7 units/ml Monitor platelets by anticoagulation protocol: Yes   Plan:   Heparin was re-started after an approximately 2 hour pause   continue heparin at 750 units/hr   recheck anti-Xa level 6 hours after restart of heparin   CBC in am  continue to monitor.  10/10, PharmD 08/13/2019,7:12 AM

## 2019-08-13 NOTE — Progress Notes (Signed)
Name: Debbie Gay MRN: [id number] DOB: [birth year]     CONSULTATION DATE: 08/12/2019  Subjective and objective: Remains on a ventilator and no major events last night  PAST MEDICAL HISTORY :   has a past medical history of Asthma, Bipolar 1 disorder (HCC), Chronic pain, COPD (chronic obstructive pulmonary disease) (HCC), Depression, GERD (gastroesophageal reflux disease), History of heart attack, IBS (irritable bowel syndrome), Myocardial infarction (HCC), and Panic attacks.  has a past surgical history that includes Abdominal hysterectomy; Esophagogastroduodenoscopy (egd) with propofol (N/A, 10/30/2014); Colonoscopy with propofol (N/A, 10/30/2014); Esophagogastroduodenoscopy (egd) with propofol (N/A, 11/23/2017); and Colonoscopy with propofol (N/A, 02/05/2018). Prior to Admission medications   Medication Sig Start Date End Date Taking? Authorizing Provider  ALPRAZolam Prudy Feeler) 0.5 MG tablet Take 0.5 mg by mouth daily.    Yes [provider]  KLOR-CON M10 10 MEQ tablet Take 20 mEq by mouth 2 (two) times daily. 07/13/19  Yes [provider]  metoCLOPramide (REGLAN) 10 MG tablet Take 10 mg by mouth every 6 (six) hours as needed for nausea or vomiting.    Yes [provider]  Oxycodone HCl 10 MG TABS Take 10 mg by mouth 4 (four) times daily.    Yes [provider]  pantoprazole (PROTONIX) 40 MG tablet Take 40 mg by mouth 2 (two) times daily.   Yes [provider]  QUEtiapine (SEROQUEL) 100 MG tablet Take 50-100 mg by mouth at bedtime. 05/03/19  Yes [provider]  thiamine (VITAMIN B-1) 100 MG tablet Take 1 tablet (100 mg total) by mouth daily. 12/02/17  Yes Leroy Sea, MD   Allergies  Allergen Reactions  . Levaquin [Levofloxacin] Swelling  . Prednisone Shortness Of Breath and Swelling  . Penicillins     Has patient had a PCN reaction causing immediate rash, facial/tongue/throat swelling, SOB or lightheadedness with hypotension:  Unknown Has patient had a PCN reaction causing severe rash involving mucus membranes or skin necrosis: Unknown Has patient had a PCN reaction that required hospitalization: Unknown Has patient had a PCN reaction occurring within the last 10 years: Unknown If all of the above answers are "NO", then may proceed with Cephalosporin use.    FAMILY HISTORY:  family history includes COPD in her mother; Glaucoma in her father; Hyperlipidemia in her father; Irritable bowel syndrome in her mother. SOCIAL HISTORY:  reports that she has been smoking. She has a 10.00 pack-year smoking history. She has never used smokeless tobacco. She reports that she does not drink alcohol and does not use drugs.  REVIEW OF SYSTEMS:   Unable to obtain due to critical illness      Estimated body mass index is 21.35 kg/m as calculated from the following:   Height as of this encounter: 5\' 5"  (1.651 m).   Weight as of this encounter: 58.2 kg.    VITAL SIGNS: Temp:  [97 F (36.1 C)-100.5 F (38.1 C)] 99.1 F (37.3 C) (08/11 0800) Pulse Rate:  [60-112] 95 (08/11 0800) Resp:  [12-26] 18 (08/11 0800) BP: (89-143)/(59-90) 136/86 (08/11 0800) SpO2:  [96 %-100 %] 100 % (08/11 0800) FiO2 (%):  [60 %] 60 % (08/11 0217) Weight:  [54.4 kg-58.2 kg] 58.2 kg (08/11 0200)   I/O last 3 completed shifts: In: 2109.2 [I.V.:2109.2] Out: 655 [Urine:605; Emesis/NG output:50] No intake/output data recorded.   SpO2: 100 % O2 Flow Rate (L/min): 10 L/min FiO2 (%): 60 %   Physical Examination:   Light sedation following commands On vent, no distress, bilateral equal  air entry and no adventitious sounds. S1 & S2 are audible with no murmur. Benign abdominal exam with normal peristalsis. No leg edema Status post paralytics  CULTURE RESULTS   Recent Results (from the past 240 hour(s))  Blood culture (routine single)     Status: None (Preliminary result)   Collection Time: 08/12/19  9:34 AM   Specimen: BLOOD  Result  Value Ref Range Status   Specimen Description BLOOD RIGHT ANTECUBITAL  Final   Special Requests   Final    BOTTLES DRAWN AEROBIC AND ANAEROBIC Blood Culture adequate volume   Culture   Final    NO GROWTH < 24 HOURS Performed at [facility], [address] [place], [place] [address]    Report Status PENDING  Incomplete  SARS Coronavirus 2 by RT PCR (hospital order, performed in [facility] hospital lab) Nasopharyngeal Nasopharyngeal Swab     Status: None   Collection Time: 08/12/19 10:00 AM   Specimen: Nasopharyngeal Swab  Result Value Ref Range Status   SARS Coronavirus 2 NEGATIVE NEGATIVE Final    Comment: (NOTE) SARS-CoV-2 target nucleic acids are NOT DETECTED.  The SARS-CoV-2 RNA is generally detectable in upper and lower respiratory specimens during the acute phase of infection. The lowest concentration of SARS-CoV-2 viral copies this assay can detect is 250 copies / mL. A negative result does not preclude SARS-CoV-2 infection and should not be used as the sole basis for treatment or other patient management decisions.  A negative result may occur with improper specimen collection / handling, submission of specimen other than nasopharyngeal swab, presence of viral mutation(s) within the areas targeted by this assay, and inadequate number of viral copies (<250 copies / mL). A negative result must be combined with clinical observations, patient history, and epidemiological information.  Fact Sheet for Patients:   BoilerBrush.com.cyhttps://www.fda.gov/media/136312/download  Fact Sheet for Healthcare Providers: https://pope.com/https://www.fda.gov/media/136313/download  This test is not yet approved or  cleared by the [place] FDA and has been authorized for detection and/or diagnosis of SARS-CoV-2 by FDA under an Emergency Use Authorization (EUA).  This EUA will remain in effect (meaning this test can be used) for the duration of the COVID-19 declaration under Section 564(b)(1) of the Act, 21  U.S.C. section 360bbb-3(b)(1), unless the authorization is terminated or revoked sooner.  Performed at [facility], [address] [place], [place] [address]   MRSA PCR Screening     Status: None   Collection Time: 08/13/19  1:46 AM   Specimen: Nasopharyngeal  Result Value Ref Range Status   MRSA by PCR NEGATIVE NEGATIVE Final    Comment:        The GeneXpert MRSA Assay (FDA approved for NASAL specimens only), is one component of a comprehensive MRSA colonization surveillance program. It is not intended to diagnose MRSA infection nor to guide or monitor treatment for MRSA infections. Performed at [facility], [address] [place], [place] [address]           IMAGING    CT Head Wo Contrast  Result Date: 08/12/2019 CLINICAL DATA:  Mental status change. EXAM: CT HEAD WITHOUT CONTRAST TECHNIQUE: Contiguous axial images were obtained from the base of the skull through the vertex without intravenous contrast. COMPARISON:  08/04/2019 MRI head and prior. 03/04/2019 CT head and prior. FINDINGS: Brain: No acute infarct or intracranial hemorrhage. No mass lesion. No midline shift, ventriculomegaly or extra-axial fluid collection. Vascular: No hyperdense vessel or unexpected calcification. Skull: Negative for fracture or focal lesion.  Sinuses/Orbits: No acute orbital findings. Clear paranasal sinuses. No mastoid effusion. Other: None. IMPRESSION: No acute intracranial pathology. Electronically Signed   By: Stana Bunting M.D.   On: 08/12/2019 12:36   DG Chest Portable 1 View  Result Date: 08/12/2019 CLINICAL DATA:  Hypoxia.  Central catheter placement EXAM: PORTABLE CHEST 1 VIEW COMPARISON:  August 12, 2019 study obtained earlier in the day FINDINGS: Central catheter tip is in the superior vena cava. Endotracheal tube tip is 4.4 cm above the carina. Nasogastric tube tip and side port are below the diaphragm. No pneumothorax. There is no edema or airspace  opacity. Heart size and pulmonary vascularity are normal. No adenopathy. Postoperative changes noted in several ribs on the left inferiorly, stable. IMPRESSION: Tube and catheter positions as described without pneumothorax. No edema or airspace opacity. Stable cardiac silhouette. Electronically Signed   By: Bretta Bang III M.D.   On: 08/12/2019 15:41   DG Chest Port 1 View  Result Date: 08/12/2019 CLINICAL DATA:  Altered mental status EXAM: PORTABLE CHEST 1 VIEW COMPARISON:  08/12/2019 FINDINGS: Endotracheal tube terminates 3.8 cm above the carina. Enteric tube distal tip and side hole within the proximal stomach. Normal heart size. Mild bibasilar opacities with improving aeration of the left lung base. No pleural effusion or pneumothorax. Prior ORIF of multiple lower left ribs. IMPRESSION: 1. Support lines and tubes in satisfactory position. 2. Mild bibasilar opacities with improving aeration of the left lung base. Electronically Signed   By: Duanne Guess D.O.   On: 08/12/2019 11:16   DG Chest Portable 1 View  Result Date: 08/12/2019 CLINICAL DATA:  Hypoxia EXAM: PORTABLE CHEST 1 VIEW COMPARISON:  August 12, 2019 study obtained earlier in the day FINDINGS: Endotracheal tube tip is 3.5 cm above the carina. Nasogastric tube tip and side port are below the diaphragm. No pneumothorax. There is mild left base atelectasis. The lungs elsewhere are clear. Heart size and pulmonary vascularity are normal. No adenopathy. No bone lesions. IMPRESSION: Tube positions as described without pneumothorax. Left base atelectasis. Lungs otherwise clear. Cardiac silhouette normal. Electronically Signed   By: Bretta Bang III M.D.   On: 08/12/2019 10:24   DG Chest Portable 1 View  Result Date: 08/12/2019 CLINICAL DATA:  Unresponsive. EXAM: PORTABLE CHEST 1 VIEW COMPARISON:  03/04/2019 FINDINGS: 0937 hours. Mild hyperexpansion. The cardiopericardial silhouette is within normal limits for size. Interstitial  markings are diffusely coarsened with chronic features. Streaky opacity at the left base suggest atelectasis. The lungs are otherwise clear without focal pneumonia, edema, pneumothorax or pleural effusion. The visualized bony structures of the thorax show now acute abnormality. Telemetry leads overlie the chest. IMPRESSION: Hyperexpansion with chronic interstitial changes and left base atelectasis. Electronically Signed   By: Kennith Center M.D.   On: 08/12/2019 09:58    Assessment and plan:  Acute respiratory failure -Intubated for airway protection. -Tolerating SBT.  Follow with weaning parameters  Altered mental status with drug overdose on benzos and narcotics.  No acute pathology on CT head without contrast.  (Improved) -Monitor neuro status  Intoxication with benzodiazepines and opiates.  As per family the patient did not have any suicidal ideation nor previous attempt of suicide. -Continue with supportive care and watch for withdrawal symptoms after extubation.  Hyperglycemia.  No known history of diabetes -Monitor blood glucose and consider glycemic control  Hyperbilirubinemia.  (Improved)  Elevated troponin most possible NSTEMI / demand versus supply mismatch (trending down). History of  heart attack -As discussed with cardiology acute  coronary syndrome is unlikely and he advised DC therapeutic heparin, continue with aspirin and statin if tolerated.  Hold on beta-blocker because of hypotension requiring titration of pressors  Sepsis with lactic acidosis. Afebrile -Empiric cefepime, monitor lactic acid and blood culture.  DVT & GI prophylaxis.  Continue supportive care  Critical care time 35 min

## 2019-08-14 ENCOUNTER — Other Ambulatory Visit: Payer: Self-pay

## 2019-08-14 ENCOUNTER — Inpatient Hospital Stay: Payer: Medicaid Other

## 2019-08-14 DIAGNOSIS — I959 Hypotension, unspecified: Secondary | ICD-10-CM

## 2019-08-14 DIAGNOSIS — R4182 Altered mental status, unspecified: Secondary | ICD-10-CM

## 2019-08-14 LAB — MAGNESIUM: Magnesium: 2.1 mg/dL (ref 1.7–2.4)

## 2019-08-14 LAB — URINE CULTURE: Culture: >=100000 — AB

## 2019-08-14 LAB — CALCIUM, IONIZED: Calcium, Ionized, Serum: 4.4 mg/dL — ABNORMAL LOW (ref 4.5–5.6)

## 2019-08-14 LAB — RENAL FUNCTION PANEL
Albumin: 2.7 g/dL — ABNORMAL LOW (ref 3.5–5.0)
Anion gap: 8 (ref 5–15)
BUN: 8 mg/dL (ref 6–20)
CO2: 27 mmol/L (ref 22–32)
Calcium: 7.9 mg/dL — ABNORMAL LOW (ref 8.9–10.3)
Chloride: 109 mmol/L (ref 98–111)
Creatinine, Ser: 0.65 mg/dL (ref 0.44–1.00)
GFR calc Af Amer: >60 mL/min (ref >60)
GFR calc non Af Amer: >60 mL/min (ref >60)
Glucose, Bld: 119 mg/dL — ABNORMAL HIGH (ref 70–99)
Phosphorus: 2 mg/dL — ABNORMAL LOW (ref 2.5–4.6)
Potassium: 3.4 mmol/L — ABNORMAL LOW (ref 3.5–5.1)
Sodium: 144 mmol/L (ref 135–145)

## 2019-08-14 LAB — GLUCOSE, CAPILLARY
Glucose-Capillary: 112 mg/dL — ABNORMAL HIGH (ref 70–99)
Glucose-Capillary: 80 mg/dL (ref 70–99)
Glucose-Capillary: 99 mg/dL (ref 70–99)
Glucose-Capillary: 147 mg/dL — ABNORMAL HIGH (ref 70–99)
Glucose-Capillary: 158 mg/dL — ABNORMAL HIGH (ref 70–99)
Glucose-Capillary: 78 mg/dL (ref 70–99)
Glucose-Capillary: 155 mg/dL — ABNORMAL HIGH (ref 70–99)
Glucose-Capillary: 132 mg/dL — ABNORMAL HIGH (ref 70–99)

## 2019-08-14 LAB — CBC WITH DIFFERENTIAL/PLATELET
Abs Immature Granulocytes: 0.02 10*3/uL (ref 0.00–0.07)
Basophils Absolute: 0 10*3/uL (ref 0.0–0.1)
Basophils Relative: 1 %
Eosinophils Absolute: 0.1 10*3/uL (ref 0.0–0.5)
Eosinophils Relative: 1 %
HCT: 34.8 % — ABNORMAL LOW (ref 36.0–46.0)
Hemoglobin: 11.3 g/dL — ABNORMAL LOW (ref 12.0–15.0)
Immature Granulocytes: 0 %
Lymphocytes Relative: 33 %
Lymphs Abs: 2.2 10*3/uL (ref 0.7–4.0)
MCH: 32.1 pg (ref 26.0–34.0)
MCHC: 32.5 g/dL (ref 30.0–36.0)
MCV: 98.9 fL (ref 80.0–100.0)
Monocytes Absolute: 0.6 10*3/uL (ref 0.1–1.0)
Monocytes Relative: 9 %
Neutro Abs: 3.7 10*3/uL (ref 1.7–7.7)
Neutrophils Relative %: 56 %
Platelets: 166 10*3/uL (ref 150–400)
RBC: 3.52 MIL/uL — ABNORMAL LOW (ref 3.87–5.11)
RDW: 13.5 % (ref 11.5–15.5)
WBC: 6.7 10*3/uL (ref 4.0–10.5)
nRBC: 0 % (ref 0.0–0.2)

## 2019-08-14 LAB — HEPARIN LEVEL (UNFRACTIONATED)
Heparin Unfractionated: 0.32 IU/mL (ref 0.30–0.70)
Heparin Unfractionated: 0.3 IU/mL (ref 0.30–0.70)

## 2019-08-14 LAB — PROCALCITONIN: Procalcitonin: 0.88 ng/mL

## 2019-08-14 MED ORDER — POTASSIUM PHOSPHATES 15 MMOLE/5ML IV SOLN
20.0000 mmol | Freq: Once | INTRAVENOUS | Status: AC
  Administered 2019-08-14: 20 mmol via INTRAVENOUS
  Filled 2019-08-14: qty 6.67

## 2019-08-14 MED ORDER — ORAL CARE MOUTH RINSE
15.0000 mL | Freq: Two times a day (BID) | OROMUCOSAL | Status: DC
  Administered 2019-08-14: 15 mL via OROMUCOSAL

## 2019-08-14 MED ORDER — ENOXAPARIN SODIUM 40 MG/0.4ML ~~LOC~~ SOLN
40.0000 mg | SUBCUTANEOUS | Status: DC
  Filled 2019-08-14: qty 0.4

## 2019-08-14 MED ORDER — INSULIN ASPART 100 UNIT/ML ~~LOC~~ SOLN
0.0000 [IU] | Freq: Three times a day (TID) | SUBCUTANEOUS | Status: DC
  Administered 2019-08-14: 2 [IU] via SUBCUTANEOUS
  Administered 2019-08-14: 17:00:00 1 [IU] via SUBCUTANEOUS
  Filled 2019-08-14 (×2): qty 1

## 2019-08-14 MED ORDER — POTASSIUM CHLORIDE CRYS ER 20 MEQ PO TBCR
40.0000 meq | EXTENDED_RELEASE_TABLET | Freq: Once | ORAL | Status: AC
  Administered 2019-08-14: 40 meq via ORAL
  Filled 2019-08-14: qty 2

## 2019-08-14 MED ORDER — CEFAZOLIN SODIUM-DEXTROSE 1-4 GM/50ML-% IV SOLN
1.0000 g | Freq: Three times a day (TID) | INTRAVENOUS | Status: DC
  Administered 2019-08-14 – 2019-08-16 (×6): 1 g via INTRAVENOUS
  Filled 2019-08-14 (×9): qty 50

## 2019-08-14 NOTE — Consult Note (Signed)
Debbie Candise Bowens MD Psychiatry  Very brief entry  Came by to assist but patient wants her sister to be with her during an interview Fiance also has trouble allowing individual personal interview.  Patient now out of ICU and is verbal.   Notes indicate Xanax and Opiate intoxication / unclear if it was suicide attempt or not  Will ask Pharmacy again to research her latest outpatient meds and see if pills are missing  Unclear why this family is so insistent on speaking for this patient and why she is so dependent on them.   Will follow up in am.   [name] MD

## 2019-08-14 NOTE — Progress Notes (Signed)
ANTICOAGULATION CONSULT NOTE  Pharmacy Consult for heparin Indication: chest pain/ACS  Patient Measurements: Height: 5\' 5"  (165.1 cm) Weight: 58.3 kg (128 lb 8.5 oz) IBW/kg (Calculated) : 57 Heparin Dosing Weight: 54 kg  Vital Signs: Temp: 98.9 F (37.2 C) (08/12 0400) Temp Source: Oral (08/12 0400) BP: 108/78 (08/12 0500) Pulse Rate: 65 (08/12 0500)  Labs: Recent Labs    08/12/19 0934 08/12/19 0934 08/12/19 1122 08/12/19 1907 08/12/19 2113 08/13/19 0146 08/13/19 0430 08/13/19 0430 08/13/19 1759 08/14/19 0053 08/14/19 0505  HGB 14.1  --   --   --   --   --  11.7*  --   --   --   --   HCT 44.1  --   --   --   --   --  34.9*  --   --   --   --   PLT 224  --   --   --   --   --  186  --   --   --   --   APTT  --   --  36  --   --   --   --   --   --   --   --   LABPROT  --   --  16.8*  --   --   --   --   --   --   --   --   INR  --   --  1.4*  --   --   --   --   --   --   --   --   HEPARINUNFRC  --   --   --   --    < >  --  0.25*   < > 0.35 0.32 0.30  CREATININE 1.72*  --   --   --   --   --  0.95  --   --  0.65  --   CKTOTAL  --   --   --  137  --   --   --   --   --   --   --   TROPONINIHS 101*   < > 369* 1,444*  --  1,205*  --   --   --   --   --    < > = values in this interval not displayed.    Estimated Creatinine Clearance: 78.2 mL/min (by C-G formula based on SCr of 0.65 mg/dL).   Medical History: Past Medical History:  Diagnosis Date  . Asthma   . Bipolar 1 disorder (HCC)   . Chronic pain   . COPD (chronic obstructive pulmonary disease) (HCC)   . Depression   . GERD (gastroesophageal reflux disease)   . History of heart attack   . IBS (irritable bowel syndrome)   . Myocardial infarction (HCC)   . Panic attacks      Assessment: 49 year old female found unresponsive by mother, brought in by EMS. Patient took oxycodone and alprazolam per report from family member. Patient with small pupils in ED. Patient requiring intubation to maintain airway.  Troponin elevated, EKG with ST segment depression. Pharmacy consulted for heparin drip.The plan is to continue heparin for another 24 hours. She is currently extubated and in CCU. H&H, platelets trending down  Heparin Course: 08/10 initiation: 3200 unit bolus, then 600 units/hr 08/11 0430 HL 0.25: inc to 750 units/hr 08/11 0905: heparin stopped for approximately 2 hours 08/11 1759 HL  0.35: therapeutic x 1  Goal of Therapy:  Heparin level 0.3-0.7 units/ml Monitor platelets by anticoagulation protocol: Yes   Plan:  08/12 @ 0500 HL 0.30 therapeutic, but trending down. Will increase rate to 850 units/hr and will recheck HL at 1100, CBC trending down will continue to monitor.  Thomasene Ripple, PharmD 08/14/2019,6:48 AM

## 2019-08-14 NOTE — Progress Notes (Addendum)
Name: Debbie Gay MRN: [id number] DOB: 02-13-1970     CONSULTATION DATE: 08/12/2019  Subjective and objective: Extubated yesterday, currently on room air Transferred to Med-Surg unit Complains of sore throat post extubation, otherwise no complaints  PAST MEDICAL HISTORY :   has a past medical history of Asthma, Bipolar 1 disorder (HCC), Chronic pain, COPD (chronic obstructive pulmonary disease) (HCC), Depression, GERD (gastroesophageal reflux disease), History of heart attack, IBS (irritable bowel syndrome), Myocardial infarction (HCC), and Panic attacks.  has a past surgical history that includes Abdominal hysterectomy; Esophagogastroduodenoscopy (egd) with propofol (N/A, 10/30/2014); Colonoscopy with propofol (N/A, 10/30/2014); Esophagogastroduodenoscopy (egd) with propofol (N/A, 11/23/2017); and Colonoscopy with propofol (N/A, 02/05/2018). Prior to Admission medications   Medication Sig Start Date End Date Taking? Authorizing Provider  ALPRAZolam Prudy Feeler) 0.5 MG tablet Take 0.5 mg by mouth daily.    Yes [provider]  KLOR-CON M10 10 MEQ tablet Take 20 mEq by mouth 2 (two) times daily. 07/13/19  Yes [provider]  metoCLOPramide (REGLAN) 10 MG tablet Take 10 mg by mouth every 6 (six) hours as needed for nausea or vomiting.    Yes [provider]  Oxycodone HCl 10 MG TABS Take 10 mg by mouth 4 (four) times daily.    Yes [provider]  pantoprazole (PROTONIX) 40 MG tablet Take 40 mg by mouth 2 (two) times daily.   Yes [provider]  QUEtiapine (SEROQUEL) 100 MG tablet Take 50-100 mg by mouth at bedtime. 05/03/19  Yes [provider]  thiamine (VITAMIN B-1) 100 MG tablet Take 1 tablet (100 mg total) by mouth daily. 12/02/17  Yes Leroy Sea, MD   Allergies  Allergen Reactions  . Levaquin [Levofloxacin] Swelling  . Prednisone Shortness Of Breath and Swelling  . Penicillins     Has patient had a PCN reaction causing  immediate rash, facial/tongue/throat swelling, SOB or lightheadedness with hypotension: Unknown Has patient had a PCN reaction causing severe rash involving mucus membranes or skin necrosis: Unknown Has patient had a PCN reaction that required hospitalization: Unknown Has patient had a PCN reaction occurring within the last 10 years: Unknown If all of the above answers are "NO", then may proceed with Cephalosporin use.    FAMILY HISTORY:  family history includes COPD in her mother; Glaucoma in her father; Hyperlipidemia in her father; Irritable bowel syndrome in her mother. SOCIAL HISTORY:  reports that she has been smoking. She has a 10.00 pack-year smoking history. She has never used smokeless tobacco. She reports that she does not drink alcohol and does not use drugs.  REVIEW OF SYSTEMS:  Positives in BOLD:  Gen: Denies fever, chills, weight change, fatigue, night sweats HEENT: Denies blurred vision, double vision, hearing loss, tinnitus, sinus congestion, rhinorrhea, +sore throat, neck stiffness, dysphagia PULM: Denies shortness of breath, cough, sputum production, hemoptysis, wheezing CV: Denies chest pain, edema, orthopnea, paroxysmal nocturnal dyspnea, palpitations GI: Denies abdominal pain, nausea, vomiting, diarrhea, hematochezia, melena, constipation, change in bowel habits GU: Denies dysuria, hematuria, polyuria, oliguria, urethral discharge Endocrine: Denies hot or cold intolerance, polyuria, polyphagia or appetite change Derm: Denies rash, dry skin, scaling or peeling skin change Heme: Denies easy bruising, bleeding, bleeding gums Neuro: Denies headache, numbness, weakness, slurred speech, loss of memory or consciousness       Estimated body mass index is 21.39 kg/m as calculated from the following:   Height as of this encounter: 5\' 5"  (1.651 m).   Weight as of this encounter: 58.3 kg.  VITAL SIGNS: Temp:  [97.9 F (36.6 C)-99 F (37.2 C)] 98.2 F (36.8 C) (08/12  1146) Pulse Rate:  [64-86] 69 (08/12 1300) Resp:  [11-21] 13 (08/12 1146) BP: (92-140)/(62-104) 124/94 (08/12 1146) SpO2:  [91 %-100 %] 94 % (08/12 1300) Weight:  [58.3 kg] 58.3 kg (08/12 0356)   I/O last 3 completed shifts: In: 3184.8 [P.O.:356; I.V.:2521.3; IV Piggyback:307.5] Out: 4030 [Urine:3980; Emesis/NG output:50] Total I/O In: 1823.3 [I.V.:1523.3; IV Piggyback:300] Out: -    SpO2: 94 % O2 Flow Rate (L/min): 2 L/min FiO2 (%): 40 %   Physical Examination:   GENERAL: Female, sitting in bed, on room air, in NAD NEURO: Awake, A&O, follows commands, speech clear, no focal deficits HEENT: Atraumatic, normocephalic, neck supple, no JVD CARDIAC: Regular rate and rhythm, S1-S2, no murmurs, rubs, gallops, 2+ pulses RESPIRATORY: Clear to auscultation bilaterally, no wheezing or crackles, even, nonlabored, normal effort GI: Soft, nontender, nondistended, no guarding or rebound tenderness MUSCULOSKELETAL: Normal bulk and tone, no deformities, no edema SKIN: Warm and dry.  No obvious rashes, lesions, ulcerations  Status post paralytics  CULTURE RESULTS   Recent Results (from the past 240 hour(s))  Blood culture (routine single)     Status: None (Preliminary result)   Collection Time: 08/12/19  9:34 AM   Specimen: BLOOD  Result Value Ref Range Status   Specimen Description BLOOD RIGHT ANTECUBITAL  Final   Special Requests   Final    BOTTLES DRAWN AEROBIC AND ANAEROBIC Blood Culture adequate volume   Culture   Final    NO GROWTH 2 DAYS Performed at [facility], [address] [place], [place] [address]    Report Status PENDING  Incomplete  Urine culture     Status: Abnormal   Collection Time: 08/12/19  9:35 AM   Specimen: In/Out Cath Urine  Result Value Ref Range Status   Specimen Description   Final    IN/OUT CATH URINE Performed at [facility], [address] [place], [place] [address]    Special Requests   Final    NONE Performed at  [facility], [address], [place], [place] [address]    Culture >=100,000 COLONIES/mL KLEBSIELLA PNEUMONIAE (A)  Final   Report Status 08/14/2019 FINAL  Final   Organism ID, Bacteria KLEBSIELLA PNEUMONIAE (A)  Final      Susceptibility   Klebsiella pneumoniae - MIC*    AMPICILLIN RESISTANT Resistant     CEFAZOLIN <=4 SENSITIVE Sensitive     CEFTRIAXONE <=0.25 SENSITIVE Sensitive     CIPROFLOXACIN <=0.25 SENSITIVE Sensitive     GENTAMICIN <=1 SENSITIVE Sensitive     IMIPENEM <=0.25 SENSITIVE Sensitive     NITROFURANTOIN 64 INTERMEDIATE Intermediate     TRIMETH/SULFA <=20 SENSITIVE Sensitive     AMPICILLIN/SULBACTAM 8 SENSITIVE Sensitive     PIP/TAZO <=4 SENSITIVE Sensitive     * >=100,000 COLONIES/mL KLEBSIELLA PNEUMONIAE  SARS Coronavirus 2 by RT PCR (hospital order, performed in [facility] hospital lab) Nasopharyngeal Nasopharyngeal Swab     Status: None   Collection Time: 08/12/19 10:00 AM   Specimen: Nasopharyngeal Swab  Result Value Ref Range Status   SARS Coronavirus 2 NEGATIVE NEGATIVE Final    Comment: (NOTE) SARS-CoV-2 target nucleic acids are NOT DETECTED.  The SARS-CoV-2 RNA is generally detectable in upper and lower respiratory specimens during the acute phase of infection. The lowest concentration of SARS-CoV-2 viral copies this assay can detect is 250 copies / mL. A negative result does not preclude  SARS-CoV-2 infection and should not be used as the sole basis for treatment or other patient management decisions.  A negative result may occur with improper specimen collection / handling, submission of specimen other than nasopharyngeal swab, presence of viral mutation(s) within the areas targeted by this assay, and inadequate number of viral copies (<250 copies / mL). A negative result must be combined with clinical observations, patient history, and epidemiological information.  Fact Sheet for Patients:    BoilerBrush.com.cy  Fact Sheet for Healthcare Providers: https://pope.com/  This test is not yet approved or  cleared by the [place] FDA and has been authorized for detection and/or diagnosis of SARS-CoV-2 by FDA under an Emergency Use Authorization (EUA).  This EUA will remain in effect (meaning this test can be used) for the duration of the COVID-19 declaration under Section 564(b)(1) of the Act, 21 U.S.C. section 360bbb-3(b)(1), unless the authorization is terminated or revoked sooner.  Performed at [facility], [address] [place], [place] [address]   MRSA PCR Screening     Status: None   Collection Time: 08/13/19  1:46 AM   Specimen: Nasopharyngeal  Result Value Ref Range Status   MRSA by PCR NEGATIVE NEGATIVE Final    Comment:        The GeneXpert MRSA Assay (FDA approved for NASAL specimens only), is one component of a comprehensive MRSA colonization surveillance program. It is not intended to diagnose MRSA infection nor to guide or monitor treatment for MRSA infections. Performed at [facility], [address] [place], [place] [address]           IMAGING    DG Chest Port 1 View  Result Date: 08/14/2019 CLINICAL DATA:  Acute respiratory failure, asthma, COPD, history MI EXAM: PORTABLE CHEST 1 VIEW COMPARISON:  Portable exam 0953 hours compared to 08/12/2019 FINDINGS: Interval removal of endotracheal tube, nasogastric tube and RIGHT jugular line. Normal heart size, mediastinal contours, and pulmonary vascularity. Bibasilar atelectasis. No acute infiltrate, pleural effusion or pneumothorax. Multiple old LEFT rib fractures with prior ORIF. IMPRESSION: Bibasilar atelectasis. Electronically Signed   By: Ulyses Southward M.D.   On: 08/14/2019 10:30    ASSESSMENT & PLAN  Acute respiratory failure in the setting of benzodiazepine and opiate overdose>> resolved -Extubated 08/13/19    Altered  mental status with drug overdose>> resolved -CT head negative -Provide supportive care   Intoxication with benzodiazepines and opiates.  As per family the patient did not have any suicidal ideation nor previous attempt of suicide. -Supportive care -Watch for withdrawal symptoms post extubation -Continue IVC  -Psych consulted, appreciate input    Elevated troponin,  NSTEMI / demand versus supply mismatch (trending down).  Hx: MI -Continuous cardiac monitoring -Cardiology following, appreciate input -Heparin drip discontinued per cardiology recommendations -Continue with aspirin and statin -Plan for Stress test 08/15/19  Klebsiella UTI -Monitor fever curve -Trend WBCs -Follow cultures and sensitivities  -Continue cefazolin   Anemia -Monitor for S/Sx of bleeding -Trend CBC -SQ Lovenox for VTE Prophylaxis  -Transfuse for Hgb <7        [facility]ACT[facility]: DISPOSITION: Med-Surg GOALS OF CARE: Full code VTE PROPHYLAXIS: SQ Lovenox CONSULTS: Cardiology, Psych UPDATES: Updated pt at bedside 08/14/2019  [facility] 08/15/19.  Harlon Ditty, AGACNP-BC [facility] Pager: [phone]    Critical care time 35 minutes

## 2019-08-14 NOTE — Progress Notes (Signed)
ANTICOAGULATION CONSULT NOTE  Pharmacy Consult for heparin Indication: chest pain/ACS  Patient Measurements: Height: 5\' 5"  (165.1 cm) Weight: 58.2 kg (128 lb 4.9 oz) IBW/kg (Calculated) : 57 Heparin Dosing Weight: 54 kg  Vital Signs: Temp: 98.6 F (37 C) (08/12 0000) Temp Source: Oral (08/12 0000) BP: 99/70 (08/12 0000) Pulse Rate: 82 (08/11 2300)  Labs: Recent Labs    08/12/19 0934 08/12/19 0934 08/12/19 1122 08/12/19 1907 08/12/19 2113 08/13/19 0146 08/13/19 0430 08/13/19 1759 08/14/19 0053  HGB 14.1  --   --   --   --   --  11.7*  --   --   HCT 44.1  --   --   --   --   --  34.9*  --   --   PLT 224  --   --   --   --   --  186  --   --   APTT  --   --  36  --   --   --   --   --   --   LABPROT  --   --  16.8*  --   --   --   --   --   --   INR  --   --  1.4*  --   --   --   --   --   --   HEPARINUNFRC  --   --   --   --    < >  --  0.25* 0.35 0.32  CREATININE 1.72*  --   --   --   --   --  0.95  --  0.65  CKTOTAL  --   --   --  137  --   --   --   --   --   TROPONINIHS 101*   < > 369* 1,444*  --  1,205*  --   --   --    < > = values in this interval not displayed.    Estimated Creatinine Clearance: 78.2 mL/min (by C-G formula based on SCr of 0.65 mg/dL).   Medical History: Past Medical History:  Diagnosis Date  . Asthma   . Bipolar 1 disorder (HCC)   . Chronic pain   . COPD (chronic obstructive pulmonary disease) (HCC)   . Depression   . GERD (gastroesophageal reflux disease)   . History of heart attack   . IBS (irritable bowel syndrome)   . Myocardial infarction (HCC)   . Panic attacks      Assessment: 49 year old female found unresponsive by mother, brought in by EMS. Patient took oxycodone and alprazolam per report from family member. Patient with small pupils in ED. Patient requiring intubation to maintain airway. Troponin elevated, EKG with ST segment depression. Pharmacy consulted for heparin drip.The plan is to continue heparin for another 24  hours. She is currently extubated and in CCU. H&H, platelets trending down  Heparin Course: 08/10 initiation: 3200 unit bolus, then 600 units/hr 08/11 0430 HL 0.25: inc to 750 units/hr 08/11 0905: heparin stopped for approximately 2 hours 08/11 1759 HL 0.35: therapeutic x 1  Goal of Therapy:  Heparin level 0.3-0.7 units/ml Monitor platelets by anticoagulation protocol: Yes   Plan:  08/12 @ 0000 HL 0.32 therapeutic. Will continue current rate and will recheck w/ am labs, CBC trending down will continue to monitor.  10/12, PharmD 08/14/2019,2:13 AM

## 2019-08-14 NOTE — Progress Notes (Signed)
Progress Note  Patient Name: Debbie Gay Date of Encounter: 08/14/2019  [facility] Cardiologist: Dr. Okey Dupre  Subjective   No complaints today, fianc at the bedside Denies significant shortness of breath, no chest pain Extubated yesterday Discussed echocardiogram findings with her, low normal ejection fraction Getting ready to have central line taken out  Inpatient Medications    Scheduled Meds: . aspirin EC  81 mg Oral Daily  . atorvastatin  40 mg Oral Daily  . Chlorhexidine Gluconate Cloth  6 each Topical Daily  . enoxaparin (LOVENOX) injection  40 mg Subcutaneous Q24H  . insulin aspart  0-9 Units Subcutaneous TID WC  . mouth rinse  15 mL Mouth Rinse BID  . nicotine  14 mg Transdermal Daily  . pantoprazole (PROTONIX) IV  40 mg Intravenous Q24H  . thiamine  100 mg Oral Daily  . vitamin B-12  5,000 mcg Oral Daily   Continuous Infusions: .  ceFAZolin (ANCEF) IV    . potassium PHOSPHATE IVPB (in mmol) 20 mmol (08/14/19 1001)   PRN Meds: acetaminophen, docusate sodium, polyethylene glycol   Vital Signs    Vitals:   08/14/19 0800 08/14/19 0900 08/14/19 1000 08/14/19 1146  BP: 140/89  (!) 134/104 (!) 124/94  Pulse: 77  86 72  Resp: 12  17 13   Temp:  97.9 F (36.6 C)  98.2 F (36.8 C)  TempSrc:  Axillary  Oral  SpO2: 97%  96% 100%  Weight:      Height:        Intake/Output Summary (Last 24 hours) at 08/14/2019 1239 Last data filed at 08/14/2019 1000 Gross per 24 hour  Intake 2523.34 ml  Output 2575 ml  Net -51.66 ml   Last 3 Weights 08/14/2019 08/13/2019 08/12/2019  Weight (lbs) 128 lb 8.5 oz 128 lb 4.9 oz 120 lb  Weight (kg) 58.3 kg 58.2 kg 54.432 kg  Some encounter information is confidential and restricted. Go to Review Flowsheets activity to see all data.      Telemetry    Normal sinus rhythm- Personally Reviewed  ECG     - Personally Reviewed  Physical Exam   GEN: No acute distress.   Neck: No JVD Cardiac: RRR, no murmurs, rubs, or  gallops.  Respiratory: Clear to auscultation bilaterally. GI: Soft, nontender, non-distended  MS: No edema; No deformity. Neuro:  Nonfocal  Psych: Normal affect   Labs    High Sensitivity Troponin:   Recent Labs  Lab 08/12/19 0934 08/12/19 1122 08/12/19 1907 08/13/19 0146  TROPONINIHS 101* 369* 1,444* 1,205*      Chemistry Recent Labs  Lab 08/12/19 0934 08/13/19 0430 08/14/19 0053  NA 142 141 144  K 4.5 3.2* 3.4*  CL 105 111 109  CO2 22 21* 27  GLUCOSE 278* 99 119*  BUN 13 19 8   CREATININE 1.72* 0.95 0.65  CALCIUM 8.3* 7.6* 7.9*  PROT 6.3* 5.0*  --   ALBUMIN 3.4* 2.8* 2.7*  AST 55* 34  --   ALT 29 25  --   ALKPHOS 69 49  --   BILITOT 1.3* 1.2  --   GFRNONAA 35* >60 >60  GFRAA 40* >60 >60  ANIONGAP 15 9 8      Hematology Recent Labs  Lab 08/12/19 0934 08/13/19 0430 08/14/19 0505  WBC 12.9* 8.7 6.7  RBC 4.41 3.61* 3.52*  HGB 14.1 11.7* 11.3*  HCT 44.1 34.9* 34.8*  MCV 100.0 96.7 98.9  MCH 32.0 32.4 32.1  MCHC 32.0 33.5 32.5  RDW 13.0 13.2 13.5  PLT 224 186 166    BNPNo results for input(s): BNP, PROBNP in the last 168 hours.   DDimer No results for input(s): DDIMER in the last 168 hours.   Radiology    DG Chest Port 1 View  Result Date: 08/14/2019 CLINICAL DATA:  Acute respiratory failure, asthma, COPD, history MI EXAM: PORTABLE CHEST 1 VIEW COMPARISON:  Portable exam 0953 hours compared to 08/12/2019 FINDINGS: Interval removal of endotracheal tube, nasogastric tube and RIGHT jugular line. Normal heart size, mediastinal contours, and pulmonary vascularity. Bibasilar atelectasis. No acute infiltrate, pleural effusion or pneumothorax. Multiple old LEFT rib fractures with prior ORIF. IMPRESSION: Bibasilar atelectasis. Electronically Signed   By: Ulyses Southward M.D.   On: 08/14/2019 10:30   DG Chest Portable 1 View  Result Date: 08/12/2019 CLINICAL DATA:  Hypoxia.  Central catheter placement EXAM: PORTABLE CHEST 1 VIEW COMPARISON:  August 12, 2019 study  obtained earlier in the day FINDINGS: Central catheter tip is in the superior vena cava. Endotracheal tube tip is 4.4 cm above the carina. Nasogastric tube tip and side port are below the diaphragm. No pneumothorax. There is no edema or airspace opacity. Heart size and pulmonary vascularity are normal. No adenopathy. Postoperative changes noted in several ribs on the left inferiorly, stable. IMPRESSION: Tube and catheter positions as described without pneumothorax. No edema or airspace opacity. Stable cardiac silhouette. Electronically Signed   By: Bretta Bang III M.D.   On: 08/12/2019 15:41   ECHOCARDIOGRAM COMPLETE  Result Date: 08/13/2019    ECHOCARDIOGRAM REPORT   Patient Name:   Debbie Gay Date of Exam: 08/13/2019 Medical Rec #:  [id number]      Height:       65.0 in Accession #:    [id number]     Weight:       128.3 lb Date of Birth:  10-19-1970      BSA:          1.638 m Patient Age:    49 years       BP:           118/74 mmHg Patient Gender: F              HR:           79 bpm. Exam Location:  [facility] Procedure: 2D Echo, Cardiac Doppler and Color Doppler Indications:     Elevated troponin  History:         Patient has no prior history of Echocardiogram examinations.                  Previous Myocardial Infarction; COPD.  Sonographer:     Cristela Blue RDCS (AE) Referring Phys:  [phone] CHRISTOPHER END Diagnosing Phys: Debbe Odea MD IMPRESSIONS  1. Left ventricular ejection fraction, by estimation, is 50 to 55%. The left ventricle has low normal function. The left ventricle has no regional wall motion abnormalities. Left ventricular diastolic parameters were normal.  2. Right ventricular systolic function is normal. The right ventricular size is normal. There is normal pulmonary artery systolic pressure.  3. The mitral valve is normal in structure. No evidence of mitral valve regurgitation. No evidence of mitral stenosis.  4. The aortic valve is grossly normal. Aortic valve regurgitation is not  visualized. No aortic stenosis is present.  5. The inferior vena cava is normal in size with greater than 50% respiratory variability, suggesting right atrial pressure of 3 mmHg. FINDINGS  Left Ventricle: Left ventricular  ejection fraction, by estimation, is 50 to 55%. The left ventricle has low normal function. The left ventricle has no regional wall motion abnormalities. The left ventricular internal cavity size was normal in size. There is no left ventricular hypertrophy. Left ventricular diastolic parameters were normal. Right Ventricle: The right ventricular size is normal. No increase in right ventricular wall thickness. Right ventricular systolic function is normal. There is normal pulmonary artery systolic pressure. The tricuspid regurgitant velocity is 1.91 m/s, and  with an assumed right atrial pressure of 3 mmHg, the estimated right ventricular systolic pressure is 17.6 mmHg. Left Atrium: Left atrial size was normal in size. Right Atrium: Right atrial size was normal in size. Pericardium: There is no evidence of pericardial effusion. Mitral Valve: The mitral valve is normal in structure. Normal mobility of the mitral valve leaflets. No evidence of mitral valve regurgitation. No evidence of mitral valve stenosis. Tricuspid Valve: The tricuspid valve is normal in structure. Tricuspid valve regurgitation is not demonstrated. No evidence of tricuspid stenosis. Aortic Valve: The aortic valve is grossly normal. Aortic valve regurgitation is not visualized. No aortic stenosis is present. Aortic valve mean gradient measures 4.3 mmHg. Aortic valve peak gradient measures 8.4 mmHg. Aortic valve area, by VTI measures 2.30 cm. Pulmonic Valve: The pulmonic valve was not well visualized. Pulmonic valve regurgitation is not visualized. No evidence of pulmonic stenosis. Aorta: The aortic root is normal in size and structure. Venous: The inferior vena cava is normal in size with greater than 50% respiratory variability,  suggesting right atrial pressure of 3 mmHg. IAS/Shunts: No atrial level shunt detected by color flow Doppler.  LEFT VENTRICLE PLAX 2D LVIDd:         5.03 cm  Diastology LVIDs:         3.61 cm  LV e' lateral:   11.70 cm/s LV PW:         1.22 cm  LV E/e' lateral: 7.1 LV IVS:        1.00 cm  LV e' medial:    10.10 cm/s LVOT diam:     2.00 cm  LV E/e' medial:  8.2 LV SV:         59 LV SV Index:   36 LVOT Area:     3.14 cm  RIGHT VENTRICLE RV Basal diam:  3.45 cm RV S prime:     12.30 cm/s TAPSE (M-mode): 3.0 cm LEFT ATRIUM             Index       RIGHT ATRIUM           Index LA diam:        2.60 cm 1.59 cm/m  RA Area:     16.10 cm LA Vol (A2C):   35.9 ml 21.92 ml/m RA Volume:   50.90 ml  31.07 ml/m LA Vol (A4C):   20.9 ml 12.76 ml/m LA Biplane Vol: 27.9 ml 17.03 ml/m  AORTIC VALVE                   PULMONIC VALVE AV Area (Vmax):    1.77 cm    PV Vmax:       0.56 m/s AV Area (Vmean):   1.79 cm    PV Peak grad:  1.3 mmHg AV Area (VTI):     2.30 cm AV Vmax:           145.33 cm/s AV Vmean:          93.933 cm/s  AV VTI:            0.258 m AV Peak Grad:      8.4 mmHg AV Mean Grad:      4.3 mmHg LVOT Vmax:         82.00 cm/s LVOT Vmean:        53.600 cm/s LVOT VTI:          0.189 m LVOT/AV VTI ratio: 0.73  AORTA Ao Root diam: 2.40 cm MITRAL VALVE               TRICUSPID VALVE MV Area (PHT): 5.27 cm    TR Peak grad:   14.6 mmHg MV Decel Time: 144 msec    TR Vmax:        191.00 cm/s MV E velocity: 82.50 cm/s MV A velocity: 71.80 cm/s  SHUNTS MV E/A ratio:  1.15        Systemic VTI:  0.19 m                            Systemic Diam: 2.00 cm Debbe OdeaBrian Agbor-Etang MD Electronically signed by Debbe OdeaBrian Agbor-Etang MD Signature Date/Time: 08/13/2019/3:10:11 PM    Final     Cardiac Studies     Patient Profile     Debbie Gay is a 49 y.o. female with a hx of "heart attack" (details unknown), COPD, IBS, bipolar disorder, and depression/anxiety, who is being seen today for the evaluation of abnormal EKG and elevated  troponin  Assessment & Plan    Elevated troponin/demand ischemia Likely respiratory distress in the setting of marked hypoxia and hypotension, was found unresponsive Felt to be secondary to drug overdose Unable to exclude cardiac ischemia -Beta-blocker held secondary to hypotension Troponin peak 1400  will discontinue heparin infusion --Discussed various treatment options for work-up of her elevated troponin including catheterization, medical management or stress testing -On further discussion, she would like to pursue stress testing to rule out ischemia.  Fianc in agreement Orders placed, Myoview scheduled for tomorrow Long discussion concerning various findings including no ischemia, or concern for ischemia needing catheterization  Hypotension Resolved, systolic pressures 120-1 30  Hypoxic respiratory distress/arrest Secondary to drug overdose it would appear per the notes Prior history Followed by psychiatry    Total encounter time more than 35 minutes  Greater than 50% was spent in counseling and coordination of care with the patient   For questions or updates, please contact [facility] Please consult [email or web address] for contact info under        Signed, Julien Nordmannimothy Darci Lykins, MD  08/14/2019, 12:39 PM

## 2019-08-14 NOTE — Consult Note (Signed)
PHARMACY CONSULT NOTE  Pharmacy Consult for Electrolyte Monitoring and Replacement   Recent Labs: Potassium (mmol/L)  Date Value  08/14/2019 3.4 (L)  08/19/2012 4.3   Magnesium (mg/dL)  Date Value  39/76/7341 2.1  04/02/2011 1.8   Calcium (mg/dL)  Date Value  93/79/0240 7.9 (L)   Calcium, Total (mg/dL)  Date Value  97/35/3299 8.7   Albumin (g/dL)  Date Value  24/26/8341 2.7 (L)  11/29/2017 3.1 (L)  08/19/2012 3.2 (L)   Phosphorus (mg/dL)  Date Value  96/22/2979 2.0 (L)   Sodium (mmol/L)  Date Value  08/14/2019 144  08/19/2012 137   Corrected Ca: 8.9 mg/dL  Assessment: 50 year old female found unresponsive by mother, brought in by EMS. Patient took oxycodone and alprazolam per report from family member. Patient with small pupils in ED. Patient requiring intubation to maintain airway. Troponin elevated, EKG with ST segment depression. She is currently extubated and in CCU with orders to transfer to floor care  Goal of Therapy:  Potassium 4.0 - 5.1 mmol/L Magnesium 2.0 - 2.4 mg/dL All Other Electrolytes WNL  Plan:   20 mmol  IV potassium phosphate (this contains 29 mEq IV potassium)  Re-check electrolytes with am labs  Because this electrolyte consult is active only in the CCU pharmacy will sign off for now  Lowella Bandy ,PharmD Clinical Pharmacist 08/14/2019 7:19 AM

## 2019-08-15 ENCOUNTER — Inpatient Hospital Stay (HOSPITAL_COMMUNITY): Payer: Medicaid Other

## 2019-08-15 ENCOUNTER — Other Ambulatory Visit: Payer: Self-pay | Admitting: Radiology

## 2019-08-15 ENCOUNTER — Inpatient Hospital Stay: Payer: Medicaid Other

## 2019-08-15 DIAGNOSIS — R079 Chest pain, unspecified: Secondary | ICD-10-CM

## 2019-08-15 LAB — NM MYOCAR MULTI W/SPECT W/WALL MOTION / EF
Estimated workload: 1 METS
Exercise duration (min): 0 min
Exercise duration (sec): 0 s
LV dias vol: 81 mL (ref 46–106)
LV sys vol: 38 mL
MPHR: 173 {beats}/min
Peak HR: 112 {beats}/min
Percent HR: 64 %
Rest HR: 68 {beats}/min
SDS: 1
SRS: 5
SSS: 0
TID: 1.1

## 2019-08-15 LAB — HEPATIC FUNCTION PANEL
ALT: 22 U/L (ref 0–44)
AST: 22 U/L (ref 15–41)
Albumin: 2.8 g/dL — ABNORMAL LOW (ref 3.5–5.0)
Alkaline Phosphatase: 61 U/L (ref 38–126)
Bilirubin, Direct: <0.1 mg/dL (ref 0.0–0.2)
Total Bilirubin: 0.8 mg/dL (ref 0.3–1.2)
Total Protein: 5.5 g/dL — ABNORMAL LOW (ref 6.5–8.1)

## 2019-08-15 LAB — GLUCOSE, CAPILLARY
Glucose-Capillary: 115 mg/dL — ABNORMAL HIGH (ref 70–99)
Glucose-Capillary: 106 mg/dL — ABNORMAL HIGH (ref 70–99)
Glucose-Capillary: 83 mg/dL (ref 70–99)

## 2019-08-15 LAB — RENAL FUNCTION PANEL
Albumin: 2.8 g/dL — ABNORMAL LOW (ref 3.5–5.0)
Anion gap: 8 (ref 5–15)
BUN: 7 mg/dL (ref 6–20)
CO2: 26 mmol/L (ref 22–32)
Calcium: 8.5 mg/dL — ABNORMAL LOW (ref 8.9–10.3)
Chloride: 107 mmol/L (ref 98–111)
Creatinine, Ser: 0.52 mg/dL (ref 0.44–1.00)
GFR calc Af Amer: >60 mL/min (ref >60)
GFR calc non Af Amer: >60 mL/min (ref >60)
Glucose, Bld: 96 mg/dL (ref 70–99)
Phosphorus: 3.5 mg/dL (ref 2.5–4.6)
Potassium: 3.6 mmol/L (ref 3.5–5.1)
Sodium: 141 mmol/L (ref 135–145)

## 2019-08-15 LAB — MAGNESIUM: Magnesium: 1.6 mg/dL — ABNORMAL LOW (ref 1.7–2.4)

## 2019-08-15 LAB — CBC
HCT: 35 % — ABNORMAL LOW (ref 36.0–46.0)
Hemoglobin: 12.1 g/dL (ref 12.0–15.0)
MCH: 32.3 pg (ref 26.0–34.0)
MCHC: 34.6 g/dL (ref 30.0–36.0)
MCV: 93.3 fL (ref 80.0–100.0)
Platelets: 186 10*3/uL (ref 150–400)
RBC: 3.75 MIL/uL — ABNORMAL LOW (ref 3.87–5.11)
RDW: 13 % (ref 11.5–15.5)
WBC: 7.5 10*3/uL (ref 4.0–10.5)
nRBC: 0 % (ref 0.0–0.2)

## 2019-08-15 LAB — VITAMIN B1: Vitamin B1 (Thiamine): 364.6 nmol/L — ABNORMAL HIGH (ref 66.5–200.0)

## 2019-08-15 MED ORDER — TECHNETIUM TC 99M TETROFOSMIN IV KIT
32.4400 | PACK | Freq: Once | INTRAVENOUS | Status: AC | PRN
  Administered 2019-08-15: 11:00:00 32.44 via INTRAVENOUS

## 2019-08-15 MED ORDER — REGADENOSON 0.4 MG/5ML IV SOLN
0.4000 mg | Freq: Once | INTRAVENOUS | Status: AC
  Administered 2019-08-15: 11:00:00 0.4 mg via INTRAVENOUS

## 2019-08-15 MED ORDER — MAGNESIUM SULFATE 2 GM/50ML IV SOLN
2.0000 g | Freq: Once | INTRAVENOUS | Status: AC
  Administered 2019-08-15: 2 g via INTRAVENOUS
  Filled 2019-08-15: qty 50

## 2019-08-15 MED ORDER — POLYETHYLENE GLYCOL 3350 17 G PO PACK
17.0000 g | PACK | Freq: Two times a day (BID) | ORAL | Status: DC
  Administered 2019-08-16: 17 g via ORAL
  Filled 2019-08-15 (×2): qty 1

## 2019-08-15 MED ORDER — ENOXAPARIN SODIUM 40 MG/0.4ML ~~LOC~~ SOLN
40.0000 mg | SUBCUTANEOUS | Status: DC
  Administered 2019-08-15: 40 mg via SUBCUTANEOUS
  Filled 2019-08-15: qty 0.4

## 2019-08-15 MED ORDER — SODIUM CHLORIDE 0.9 % IV SOLN
INTRAVENOUS | Status: DC | PRN
  Administered 2019-08-15 (×3): 250 mL via INTRAVENOUS

## 2019-08-15 MED ORDER — TECHNETIUM TC 99M TETROFOSMIN IV KIT
10.1400 | PACK | Freq: Once | INTRAVENOUS | Status: AC | PRN
  Administered 2019-08-15: 10.14 via INTRAVENOUS

## 2019-08-15 NOTE — Progress Notes (Signed)
Progress Note  Patient Name: Debbie Gay Date of Encounter: 08/15/2019  Primary Cardiologist: Dr, End  Subjective   No CP, racing HR, palpitations.  Reports improved breathing when evaluated prior to stress test.   She was extubated 08/11/19 and is currently doing well ORA.   Inpatient Medications    Scheduled Meds: . aspirin EC  81 mg Oral Daily  . atorvastatin  40 mg Oral Daily  . enoxaparin (LOVENOX) injection  40 mg Subcutaneous Q24H  . insulin aspart  0-9 Units Subcutaneous TID WC  . nicotine  14 mg Transdermal Daily  . pantoprazole (PROTONIX) IV  40 mg Intravenous Q24H  . thiamine  100 mg Oral Daily  . vitamin B-12  5,000 mcg Oral Daily   Continuous Infusions: . sodium chloride 250 mL (08/15/19 0915)  .  ceFAZolin (ANCEF) IV 1 g (08/15/19 0606)   PRN Meds: sodium chloride, acetaminophen, docusate sodium, polyethylene glycol   Vital Signs    Vitals:   08/14/19 1300 08/14/19 1550 08/15/19 0500 08/15/19 0859  BP:  116/68 125/81 128/85  Pulse: 69 70 87 70  Resp:  18 16 11   Temp:  98.6 F (37 C) 98.2 F (36.8 C) (!) 97.5 F (36.4 C)  TempSrc:    Oral  SpO2: 94% 92% 91% 94%  Weight:      Height:        Intake/Output Summary (Last 24 hours) at 08/15/2019 1115 Last data filed at 08/15/2019 0421 Gross per 24 hour  Intake 85.19 ml  Output --  Net 85.19 ml   Last 3 Weights 08/14/2019 08/13/2019 08/12/2019  Weight (lbs) 128 lb 8.5 oz 128 lb 4.9 oz 120 lb  Weight (kg) 58.3 kg 58.2 kg 54.432 kg  Some encounter information is confidential and restricted. Go to Review Flowsheets activity to see all data.      Telemetry    NSR - Personally Reviewed  ECG    No new tracings- Personally Reviewed  Physical Exam  * GEN: No acute distress.   Neck: No JVD Cardiac: RRR, no murmurs, rubs, or gallops.  Respiratory: Clear to auscultation bilaterally. GI: Soft, nontender, non-distended  MS: No edema; No deformity. Neuro:  Nonfocal  Psych: Normal affect    Labs    High Sensitivity Troponin:   Recent Labs  Lab 08/12/19 0934 08/12/19 1122 08/12/19 1907 08/13/19 0146  TROPONINIHS 101* 369* 1,444* 1,205*      Chemistry Recent Labs  Lab 08/12/19 0934 08/12/19 0934 08/13/19 0430 08/14/19 0053 08/15/19 0443  NA 142   < > 141 144 141  K 4.5   < > 3.2* 3.4* 3.6  CL 105   < > 111 109 107  CO2 22   < > 21* 27 26  GLUCOSE 278*   < > 99 119* 96  BUN 13   < > 19 8 7   CREATININE 1.72*   < > 0.95 0.65 0.52  CALCIUM 8.3*   < > 7.6* 7.9* 8.5*  PROT 6.3*  --  5.0*  --   --   ALBUMIN 3.4*   < > 2.8* 2.7* 2.8*  AST 55*  --  34  --   --   ALT 29  --  25  --   --   ALKPHOS 69  --  49  --   --   BILITOT 1.3*  --  1.2  --   --   GFRNONAA 35*   < > >60 >60 >60  GFRAA  40*   < > >60 >60 >60  ANIONGAP 15   < > 9 8 8    < > = values in this interval not displayed.     Hematology Recent Labs  Lab 08/13/19 0430 08/14/19 0505 08/15/19 0443  WBC 8.7 6.7 7.5  RBC 3.61* 3.52* 3.75*  HGB 11.7* 11.3* 12.1  HCT 34.9* 34.8* 35.0*  MCV 96.7 98.9 93.3  MCH 32.4 32.1 32.3  MCHC 33.5 32.5 34.6  RDW 13.2 13.5 13.0  PLT 186 166 186    BNPNo results for input(s): BNP, PROBNP in the last 168 hours.   DDimer No results for input(s): DDIMER in the last 168 hours.   Radiology    DG Chest Port 1 View  Result Date: 08/14/2019 CLINICAL DATA:  Acute respiratory failure, asthma, COPD, history MI EXAM: PORTABLE CHEST 1 VIEW COMPARISON:  Portable exam 0953 hours compared to 08/12/2019 FINDINGS: Interval removal of endotracheal tube, nasogastric tube and RIGHT jugular line. Normal heart size, mediastinal contours, and pulmonary vascularity. Bibasilar atelectasis. No acute infiltrate, pleural effusion or pneumothorax. Multiple old LEFT rib fractures with prior ORIF. IMPRESSION: Bibasilar atelectasis. Electronically Signed   By: 10/12/2019 M.D.   On: 08/14/2019 10:30    Cardiac Studies   Echo 08/13/19 1. Left ventricular ejection fraction, by  estimation, is 50 to 55%. The  left ventricle has low normal function. The left ventricle has no regional  wall motion abnormalities. Left ventricular diastolic parameters were  normal.  2. Right ventricular systolic function is normal. The right ventricular  size is normal. There is normal pulmonary artery systolic pressure.  3. The mitral valve is normal in structure. No evidence of mitral valve  regurgitation. No evidence of mitral stenosis.  4. The aortic valve is grossly normal. Aortic valve regurgitation is not  visualized. No aortic stenosis is present.  5. The inferior vena cava is normal in size with greater than 50%  respiratory variability, suggesting right atrial pressure of 3 mmHg.    Pending MPI results  Patient Profile     49 y.o. female with history of "heart attack" with details unknown, COPD, IBS, bipolar disorder, and depression/anxiety, and who is being seen today for the evaluation of abnormal EKG with elevated HS Tn.   Assessment & Plan    Elevated HS Tn --HS Tn peaked at 1444 and down-trending. Consider supply demand ischemia in the setting of hypoxia and hypotension after found unresponsive. Event felt to be secondary to drug overdose but unable to exclude cardiac ischemia. S/p 48h heparin. Echo as above with EF low normal at 50-55%. Pending Lexiscan results for further ischemic workup at this time with further recommendations regarding if indication for cath once resulted. Continue medical management and risk factor modification.   Hypotension --Resolved.  Acute hypoxic respiratory failure  --Thought secondary to drug overdose. IVC patient with sitter at bedside.  Hypokalemia, hypomagnesium --K 3.6. Replete with goal of 4.0. --Mg 1.6 with goal 2.0.   For questions or updates, please contact [facility] Please consult [email or web address] for contact info under        Signed, 59, PA-C  08/15/2019, 11:15 AM

## 2019-08-15 NOTE — Progress Notes (Signed)
PROGRESS NOTE    Debbie Gay  NAT:[id number] DOB: 06-29-1970 DOA: 08/12/2019 PCP: [name], MD   Brief Narrative: 49 year old with past medical history significant for chronic tobacco abuse, COPD, bipolar disorder, depression, irritable bowel syndrome, chronic pains, benzodiazepine and narcotics dependency, thiamine deficiency and EGD disorder who presents to the emergency department on 08/12/2019 with altered mental status. As per family she was found with empty bottles of benzo and narcotic which were prescribed recently..Per notes patient initially responded to Narcan, however she had to be intubated for airway protection. Was noted to be hypotensive and was a started on vasopressors and ketamine in the ED. Of note patient had prior history of drug overdose, altered mental status which was treated. 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  Patient was admitted by critical care team, patient was treated with support care. Patient was found to have elevated troponin, cardiology was consulted plan is for stress test inpatient.     Assessment & Plan:   Active Problems:   Acute respiratory failure (HCC)   Elevated troponin  Acute respiratory failure; Overdose Intubated for air-way protection 8/10- Extubated 08/13/2019 Currently on room air.  Check oxygen saturation on ambulation.  Acute metabolic encephalopathy; related to overdose;  CT head negative.  Thiamine pending.  On oral thiamine.  Awaiting psych evaluation/   Elevated Troponin- NSTEMI-Demand ischemia; -stress test inpatient.  No significant evidence of ischemia, low risk stress test results. -No acute evaluation by cardiology.  Klebsiella UTI; Sepsis; lactic acidosis.  Patient with fever, tachypnea, Tachycardia.  UA; grew Klebsiella PNeumonia. 6-10 WBC,  Plan to complete 5 days treatment.  Hyperbilirubinemia; mild bili at 1.3  Flow trend.   Hypomagnesemia; replete IV   Anemia;  Monitor  hb  Abdominal pain; no BM. Check KUB.  IV protonix.     Estimated body mass index is 21.39 kg/m as calculated from the following:   Height as of this encounter:  (1.651 m).   Weight as of this encounter: 58.3 kg.   DVT prophylaxis: Lovenox Code Status: Full code Family Communication: Care discussed with sister who was at bedside Disposition Plan:  Status is: Inpatient  Remains inpatient appropriate because:Persistent severe electrolyte disturbances   Dispo: The patient is from: Home              Anticipated d/c is to: Home              Anticipated d/c date is: 2 days              Patient currently is not medically stable to d/c.        Consultants:   Psych  CCM admitted patient  Procedures:     Antimicrobials:    Subjective: Patient is alert, complaining of abdominal pain.  Epigastric pain generalized pain.  No bowel movement since admission.  Objective: Vitals:   08/14/19 1146 08/14/19 1300 08/14/19 1550 08/15/19 0500  BP: (!) 124/94  116/68 125/81  Pulse: 72 69 70 87  Resp: Temp: 98.2 F (36.8 C)  98.6 F (37 C) 98.2 F (36.8 C)  TempSrc: Oral     SpO2: 100% 94% 92% 91%  Weight:      Height:        Intake/Output Summary (Last 24 hours) at 08/15/2019 0738 Last data filed at 08/15/2019 0421 Gross per 24 hour  Intake 1908.45 ml  Output --  Net 1908.45 ml   Filed Weights   08/12/19 0950 08/13/19  0200 08/14/19 0356  Weight: 54.4 kg 58.2 kg 58.3 kg    Examination:  General exam: Appears calm and comfortable  Respiratory system: Clear to auscultation. Respiratory effort normal. Cardiovascular system: S1 & S2 heard, RRR. No JVD, murmurs, rubs, gallops or clicks. No pedal edema. Gastrointestinal system: Abdomen is nondistended, soft and nontender. No organomegaly or masses felt. Normal bowel sounds noted. Central nervous system: Alert and oriented. No focal neurological deficits. Extremities: Symmetric 5 x 5 power. Skin: No  rashes, lesions or ulcers    Data Reviewed: I have personally reviewed following labs and imaging studies  CBC: Recent Labs  Lab 08/12/19 0934 08/13/19 0430 08/14/19 0505 08/15/19 0443  WBC 12.9* 8.7 6.7 7.5  NEUTROABS 11.2* 5.9 3.7  --   HGB 14.1 11.7* 11.3* 12.1  HCT 44.1 34.9* 34.8* 35.0*  MCV 100.0 96.7 98.9 93.3  PLT 224 186 166 186   Basic Metabolic Panel: Recent Labs  Lab 08/12/19 0934 08/12/19 1913 08/13/19 0430 08/14/19 0053 08/15/19 0443  NA 142  --  141 144 141  K 4.5  --  3.2* 3.4* 3.6  CL 105  --  111 109 107  CO2 22  --  21* 27 26  GLUCOSE 278*  --  99 119* 96  BUN 13  --  19 8 7   CREATININE 1.72*  --  0.95 0.65 0.52  CALCIUM 8.3*  --  7.6* 7.9* 8.5*  MG  --   --  1.5* 2.1 1.6*  PHOS  --  2.8  --  2.0* 3.5   GFR: Estimated Creatinine Clearance: 78.2 mL/min (by C-G formula based on SCr of 0.52 mg/dL). Liver Function Tests: Recent Labs  Lab 08/12/19 0934 08/13/19 0430 08/14/19 0053 08/15/19 0443  AST 55* 34  --   --   ALT 29 25  --   --   ALKPHOS 69 49  --   --   BILITOT 1.3* 1.2  --   --   PROT 6.3* 5.0*  --   --   ALBUMIN 3.4* 2.8* 2.7* 2.8*   No results for input(s): LIPASE, AMYLASE in the last 168 hours. No results for input(s): AMMONIA in the last 168 hours. Coagulation Profile: Recent Labs  Lab 08/12/19 1122  INR 1.4*   Cardiac Enzymes: Recent Labs  Lab 08/12/19 1907  CKTOTAL 137   BNP (last 3 results) No results for input(s): PROBNP in the last 8760 hours. HbA1C: Recent Labs    08/12/19 1913  HGBA1C 6.3*   CBG: Recent Labs  Lab 08/13/19 2350 08/14/19 0401 08/14/19 0839 08/14/19 1217 08/14/19 1559  GLUCAP 115* 112* 147* 155* 132*   Lipid Profile: Recent Labs    08/13/19 0146  TRIG 68   Thyroid Function Tests: No results for input(s): TSH, T4TOTAL, FREET4, T3FREE, THYROIDAB in the last 72 hours. Anemia Panel: Recent Labs    08/12/19 1913  FOLATE >100.0   Sepsis Labs: Recent Labs  Lab 08/12/19 0934  08/12/19 1122 08/13/19 0430 08/13/19 0918 08/13/19 1202 08/14/19 0053  PROCALCITON  --  0.35 1.88  --   --  0.88  LATICACIDVEN 5.0* 2.5*  --  0.7 0.8  --     Recent Results (from the past 240 hour(s))  Blood culture (routine single)     Status: None (Preliminary result)   Collection Time: 08/12/19  9:34 AM   Specimen: BLOOD  Result Value Ref Range Status   Specimen Description BLOOD RIGHT ANTECUBITAL  Final   Special Requests  Final    BOTTLES DRAWN AEROBIC AND ANAEROBIC Blood Culture adequate volume   Culture   Final    NO GROWTH 2 DAYS Performed at [facility], [address] [place], [place] [address]    Report Status PENDING  Incomplete  Urine culture     Status: Abnormal   Collection Time: 08/12/19  9:35 AM   Specimen: In/Out Cath Urine  Result Value Ref Range Status   Specimen Description   Final    IN/OUT CATH URINE Performed at [facility], [address] [place], [place] [address]    Special Requests   Final    NONE Performed at [facility], [address] [place], [place] [address]    Culture >=100,000 COLONIES/mL KLEBSIELLA PNEUMONIAE (A)  Final   Report Status 08/14/2019 FINAL  Final   Organism ID, Bacteria KLEBSIELLA PNEUMONIAE (A)  Final      Susceptibility   Klebsiella pneumoniae - MIC*    AMPICILLIN RESISTANT Resistant     CEFAZOLIN <=4 SENSITIVE Sensitive     CEFTRIAXONE <=0.25 SENSITIVE Sensitive     CIPROFLOXACIN <=0.25 SENSITIVE Sensitive     GENTAMICIN <=1 SENSITIVE Sensitive     IMIPENEM <=0.25 SENSITIVE Sensitive     NITROFURANTOIN 64 INTERMEDIATE Intermediate     TRIMETH/SULFA <=20 SENSITIVE Sensitive     AMPICILLIN/SULBACTAM 8 SENSITIVE Sensitive     PIP/TAZO <=4 SENSITIVE Sensitive     * >=100,000 COLONIES/mL KLEBSIELLA PNEUMONIAE  SARS Coronavirus 2 by RT PCR (hospital order, performed in [facility] hospital lab) Nasopharyngeal Nasopharyngeal Swab     Status: None   Collection Time: 08/12/19 10:00 AM    Specimen: Nasopharyngeal Swab  Result Value Ref Range Status   SARS Coronavirus 2 NEGATIVE NEGATIVE Final    Comment: (NOTE) SARS-CoV-2 target nucleic acids are NOT DETECTED.  The SARS-CoV-2 RNA is generally detectable in upper and lower respiratory specimens during the acute phase of infection. The lowest concentration of SARS-CoV-2 viral copies this assay can detect is 250 copies / mL. A negative result does not preclude SARS-CoV-2 infection and should not be used as the sole basis for treatment or other patient management decisions.  A negative result may occur with improper specimen collection / handling, submission of specimen other than nasopharyngeal swab, presence of viral mutation(s) within the areas targeted by this assay, and inadequate number of viral copies (<250 copies / mL). A negative result must be combined with clinical observations, patient history, and epidemiological information.  Fact Sheet for Patients:   BoilerBrush.com.cy  Fact Sheet for Healthcare Providers: https://pope.com/  This test is not yet approved or  cleared by the [place] FDA and has been authorized for detection and/or diagnosis of SARS-CoV-2 by FDA under an Emergency Use Authorization (EUA).  This EUA will remain in effect (meaning this test can be used) for the duration of the COVID-19 declaration under Section 564(b)(1) of the Act, 21 U.S.C. section 360bbb-3(b)(1), unless the authorization is terminated or revoked sooner.  Performed at [facility], [address] [place], [place] [address]   MRSA PCR Screening     Status: None   Collection Time: 08/13/19  1:46 AM   Specimen: Nasopharyngeal  Result Value Ref Range Status   MRSA by PCR NEGATIVE NEGATIVE Final    Comment:        The GeneXpert MRSA Assay (FDA approved for NASAL specimens only), is one component of a comprehensive MRSA colonization surveillance program.  It is not intended  to diagnose MRSA infection nor to guide or monitor treatment for MRSA infections. Performed at [facility], [address] [place], [place] [address]          Radiology Studies: DG Chest [place]  Result Date: 08/14/2019 CLINICAL DATA:  Acute respiratory failure, asthma, COPD, history MI EXAM: PORTABLE CHEST 1 VIEW COMPARISON:  Portable exam 0953 hours compared to 08/12/2019 FINDINGS: Interval removal of endotracheal tube, nasogastric tube and RIGHT jugular line. Normal heart size, mediastinal contours, and pulmonary vascularity. Bibasilar atelectasis. No acute infiltrate, pleural effusion or pneumothorax. Multiple old LEFT rib fractures with prior ORIF. IMPRESSION: Bibasilar atelectasis. Electronically Signed   By: Ulyses SouthwardMark  Boles M.D.   On: 08/14/2019 10:30   ECHOCARDIOGRAM COMPLETE  Result Date: 08/13/2019    ECHOCARDIOGRAM REPORT   Patient Name:   Charlene BrookeCARRIE L Decandia Date of Exam: 08/13/2019 Medical Rec #:  [id number]      Height:       65.0 in Accession #:    [id number]     Weight:       128.3 lb Date of Birth:  September 18, 1970      BSA:          1.638 m Patient Age:    49 years       BP:           118/74 mmHg Patient Gender: F              HR:           79 bpm. Exam Location:  [facility] Procedure: 2D Echo, Cardiac Doppler and Color Doppler Indications:     Elevated troponin  History:         Patient has no prior history of Echocardiogram examinations.                  Previous Myocardial Infarction; COPD.  Sonographer:     Cristela BlueJerry Hege RDCS (AE) Referring Phys:  [phone] CHRISTOPHER END Diagnosing Phys: Debbe OdeaBrian Agbor-Etang MD IMPRESSIONS  1. Left ventricular ejection fraction, by estimation, is 50 to 55%. The left ventricle has low normal function. The left ventricle has no regional wall motion abnormalities. Left ventricular diastolic parameters were normal.  2. Right ventricular systolic function is normal. The right ventricular size is normal. There is normal pulmonary artery  systolic pressure.  3. The mitral valve is normal in structure. No evidence of mitral valve regurgitation. No evidence of mitral stenosis.  4. The aortic valve is grossly normal. Aortic valve regurgitation is not visualized. No aortic stenosis is present.  5. The inferior vena cava is normal in size with greater than 50% respiratory variability, suggesting right atrial pressure of 3 mmHg. FINDINGS  Left Ventricle: Left ventricular ejection fraction, by estimation, is 50 to 55%. The left ventricle has low normal function. The left ventricle has no regional wall motion abnormalities. The left ventricular internal cavity size was normal in size. There is no left ventricular hypertrophy. Left ventricular diastolic parameters were normal. Right Ventricle: The right ventricular size is normal. No increase in right ventricular wall thickness. Right ventricular systolic function is normal. There is normal pulmonary artery systolic pressure. The tricuspid regurgitant velocity is 1.91 m/s, and  with an assumed right atrial pressure of 3 mmHg, the estimated right ventricular systolic pressure is 17.6 mmHg. Left Atrium: Left atrial size was normal in size. Right Atrium: Right atrial size was normal in size. Pericardium: There is no evidence of pericardial effusion. Mitral Valve: The mitral valve  is normal in structure. Normal mobility of the mitral valve leaflets. No evidence of mitral valve regurgitation. No evidence of mitral valve stenosis. Tricuspid Valve: The tricuspid valve is normal in structure. Tricuspid valve regurgitation is not demonstrated. No evidence of tricuspid stenosis. Aortic Valve: The aortic valve is grossly normal. Aortic valve regurgitation is not visualized. No aortic stenosis is present. Aortic valve mean gradient measures 4.3 mmHg. Aortic valve peak gradient measures 8.4 mmHg. Aortic valve area, by VTI measures 2.30 cm. Pulmonic Valve: The pulmonic valve was not well visualized. Pulmonic valve  regurgitation is not visualized. No evidence of pulmonic stenosis. Aorta: The aortic root is normal in size and structure. Venous: The inferior vena cava is normal in size with greater than 50% respiratory variability, suggesting right atrial pressure of 3 mmHg. IAS/Shunts: No atrial level shunt detected by color flow Doppler.  LEFT VENTRICLE PLAX 2D LVIDd:         5.03 cm  Diastology LVIDs:         3.61 cm  LV e' lateral:   11.70 cm/s LV PW:         1.22 cm  LV E/e' lateral: 7.1 LV IVS:        1.00 cm  LV e' medial:    10.10 cm/s LVOT diam:     2.00 cm  LV E/e' medial:  8.2 LV SV:         59 LV SV Index:   36 LVOT Area:     3.14 cm  RIGHT VENTRICLE RV Basal diam:  3.45 cm RV S prime:     12.30 cm/s TAPSE (M-mode): 3.0 cm LEFT ATRIUM             Index       RIGHT ATRIUM           Index LA diam:        2.60 cm 1.59 cm/m  RA Area:     16.10 cm LA Vol (A2C):   35.9 ml 21.92 ml/m RA Volume:   50.90 ml  31.07 ml/m LA Vol (A4C):   20.9 ml 12.76 ml/m LA Biplane Vol: 27.9 ml 17.03 ml/m  AORTIC VALVE                   PULMONIC VALVE AV Area (Vmax):    1.77 cm    PV Vmax:       0.56 m/s AV Area (Vmean):   1.79 cm    PV Peak grad:  1.3 mmHg AV Area (VTI):     2.30 cm AV Vmax:           145.33 cm/s AV Vmean:          93.933 cm/s AV VTI:            0.258 m AV Peak Grad:      8.4 mmHg AV Mean Grad:      4.3 mmHg LVOT Vmax:         82.00 cm/s LVOT Vmean:        53.600 cm/s LVOT VTI:          0.189 m LVOT/AV VTI ratio: 0.73  AORTA Ao Root diam: 2.40 cm MITRAL VALVE               TRICUSPID VALVE MV Area (PHT): 5.27 cm    TR Peak grad:   14.6 mmHg MV Decel Time: 144 msec    TR Vmax:  191.00 cm/s MV E velocity: 82.50 cm/s MV A velocity: 71.80 cm/s  SHUNTS MV E/A ratio:  1.15        Systemic VTI:  0.19 m                            Systemic Diam: 2.00 cm Debbe Odea MD Electronically signed by Debbe Odea MD Signature Date/Time: 08/13/2019/3:10:11 PM    Final         Scheduled Meds:  aspirin EC  81  mg Oral Daily   atorvastatin  40 mg Oral Daily   enoxaparin (LOVENOX) injection  40 mg Subcutaneous Q24H   insulin aspart  0-9 Units Subcutaneous TID WC   nicotine  14 mg Transdermal Daily   pantoprazole (PROTONIX) IV  40 mg Intravenous Q24H   thiamine  100 mg Oral Daily   vitamin B-12  5,000 mcg Oral Daily   Continuous Infusions:   ceFAZolin (ANCEF) IV 1 g (08/15/19 0606)   magnesium sulfate bolus IVPB       LOS: 3 days    Time spent: 35 minutes.     Alba Cory, MD [facility]   If 7PM-7AM, please contact night-coverage [email or web address]  08/15/2019, 7:38 AM

## 2019-08-15 NOTE — Consult Note (Addendum)
[facility] Face-to-Face Psychiatry Consult   Reason for Consult:   Depression family issues stressors  Referring Physician:   IM team  Patient Identification: Debbie Gay MRN:  [id number] Principal Diagnosis: <principal problem not specified> Diagnosis:  Active Problems:   Acute respiratory failure (HCC)   Elevated troponin  Major depression recuirrent Generalized anxiety  Eating disorder NOS Adjustment disorder Family discord  Chronic pain    Total Time spent with patient: 30-40 mion  Subjective:   Debbie Gay is a 49 y.o. female patient admitted with  Post intubation and post alleged OD --patient and sister say it was not an intentional OD   IVC initially --and now today is rescinded    HPI:    Patient insisted that sister be in on eval and dialogue with fiance --Tried to come by yesterday   Patient has a long history of major depression, dysthymia, chronic pain ----generalized anxiety family discord.   Now feels better post intubation and treatment   She insists she wants to go to fiance and then go to day treatment for at least one month --possibly with IOP   She contracts for safety and feels if she is at fiance 's house she will be stable and safe  Sister on phone and fiance in room agree with this plan   All three do not want meds at this time but say she needs therapy support   Previous similar episode but this was due to profound thiamine deficiency   This time ---they also say she was not trying to harm self ----but took tabs ---to cope and used poor judgement      Past Psychiatric History:   No major outpatient psych or inpatient psych over time no eating disordered assistance either   Risk to Self: Suicidal Ideation:  (UTA) Suicidal Intent:  (UTA) Is patient at risk for suicide?: No, but patient needs Medical Clearance Suicidal Plan?: No Access to Means: No What has been your use of drugs/alcohol within the last 12 months?: None reported  How many  times?: 0 Other Self Harm Risks: None reported  Triggers for Past Attempts: None known Intentional Self Injurious Behavior: None Risk to Others: Homicidal Ideation:  (UTA) Thoughts of Harm to Others:  (UTA) Current Homicidal Intent:  (UTA) Current Homicidal Plan:  (UTA) Access to Homicidal Means:  (UTA) Identified Victim: UTA History of harm to others?: No Assessment of Violence: None Noted Violent Behavior Description: N/A Does patient have access to weapons?: No Criminal Charges Pending?: No Does patient have a court date: No Prior Inpatient Therapy: Prior Inpatient Therapy: No Prior Outpatient Therapy: Prior Outpatient Therapy: Yes Prior Therapy Dates: CURRENT Prior Therapy Facilty/Provider(s): Dr. Collie Siad  (family reports to be unsure & location to be [facility] ) Reason for Treatment: Med management  Does patient have an ACCT team?: No Does patient have Intensive In-House Services?  : No Does patient have [facility] services? : Unknown Does patient have P4CC services?: Unknown  Past Medical History:  Past Medical History:  Diagnosis Date   Asthma    Bipolar 1 disorder ([place])    Chronic pain    COPD (chronic obstructive pulmonary disease) ([place])    Depression    GERD (gastroesophageal reflux disease)    History of heart attack    IBS (irritable bowel syndrome)    Myocardial infarction ([place])    Panic attacks     Past Surgical History:  Procedure Laterality Date   ABDOMINAL HYSTERECTOMY     COLONOSCOPY  WITH PROPOFOL N/A 10/30/2014   Procedure: COLONOSCOPY WITH PROPOFOL;  Surgeon: Lollie Sails, MD;  Location: [facility] ENDOSCOPY;  Service: Endoscopy;  Laterality: N/A;   COLONOSCOPY WITH PROPOFOL N/A 02/05/2018   Procedure: COLONOSCOPY WITH PROPOFOL;  Surgeon: Lollie Sails, MD;  Location: [facility] ENDOSCOPY;  Service: Endoscopy;  Laterality: N/A;   ESOPHAGOGASTRODUODENOSCOPY (EGD) WITH PROPOFOL N/A 10/30/2014   Procedure: ESOPHAGOGASTRODUODENOSCOPY (EGD) WITH  PROPOFOL;  Surgeon: Lollie Sails, MD;  Location: [facility] ENDOSCOPY;  Service: Endoscopy;  Laterality: N/A;   ESOPHAGOGASTRODUODENOSCOPY (EGD) WITH PROPOFOL N/A 11/23/2017   Procedure: ESOPHAGOGASTRODUODENOSCOPY (EGD) WITH PROPOFOL;  Surgeon: Toledo, Benay Pike, MD;  Location: [facility] ENDOSCOPY;  Service: Gastroenterology;  Laterality: N/A;   Family History:  Family History  Problem Relation Age of Onset   COPD Mother    Irritable bowel syndrome Mother    Glaucoma Father    Hyperlipidemia Father    Family Psychiatric  History:  None newly reported  Social History:   Was upset living with relatives with disabled people there causing her stress guilt and grief where she wants a healthier living   Has supportive sister and fiance at this time who want her to live with him and she agrees    Social History   Substance and Sexual Activity  Alcohol Use No     Social History   Substance and Sexual Activity  Drug Use No    Social History   Socioeconomic History   Marital status: Divorced    Spouse name: Not on file   Number of children: 3   Years of education: GED/some college   Highest education level: Not on file  Occupational History   Occupation: Disabled  Tobacco Use   Smoking status: Current Every Day Smoker    Packs/day: 0.50    Years: 20.00    Pack years: 10.00   Smokeless tobacco: Never Used  Scientific laboratory technician Use: Former  Substance and Sexual Activity   Alcohol use: No   Drug use: No   Sexual activity: Not Currently  Other Topics Concern   Not on file  Social History Narrative   Right-handed.   Drinks four 16oz of [employer] per day.   Lives at home grandmother, uncle and daughter.   Social Determinants of Health   Financial Resource Strain:    Difficulty of Paying Living Expenses:   Food Insecurity:    Worried About Charity fundraiser in the Last Year:    Arboriculturist in the Last Year:   Transportation Needs:    Consulting civil engineer (Medical):    Lack of Transportation (Non-Medical):   Physical Activity:    Days of Exercise per Week:    Minutes of Exercise per Session:   Stress:    Feeling of Stress :   Social Connections:    Frequency of Communication with Friends and Family:    Frequency of Social Gatherings with Friends and Family:    Attends Religious Services:    Active Member of Clubs or Organizations:    Attends Archivist Meetings:    Marital Status:    Additional Social History:    Allergies:   Allergies  Allergen Reactions   Levaquin [Levofloxacin] Swelling   Prednisone Shortness Of Breath and Swelling   Penicillins     Has patient had a PCN reaction causing immediate rash, facial/tongue/throat swelling, SOB or lightheadedness with hypotension: Unknown Has patient had a PCN reaction causing severe rash involving  mucus membranes or skin necrosis: Unknown Has patient had a PCN reaction that required hospitalization: Unknown Has patient had a PCN reaction occurring within the last 10 years: Unknown If all of the above answers are "NO", then may proceed with Cephalosporin use.    Labs:  Results for orders placed or performed during the hospital encounter of 08/12/19 (from the past 48 hour(s))  Glucose, capillary     Status: Abnormal   Collection Time: 08/13/19  7:53 PM  Result Value Ref Range   Glucose-Capillary 165 (H) 70 - 99 mg/dL    Comment: Glucose reference range applies only to samples taken after fasting for at least 8 hours.  Glucose, capillary     Status: Abnormal   Collection Time: 08/13/19 11:50 PM  Result Value Ref Range   Glucose-Capillary 115 (H) 70 - 99 mg/dL    Comment: Glucose reference range applies only to samples taken after fasting for at least 8 hours.  Procalcitonin     Status: None   Collection Time: 08/14/19 12:53 AM  Result Value Ref Range   Procalcitonin 0.88 ng/mL    Comment:        Interpretation: PCT > 0.5 ng/mL and <=  2 ng/mL: Systemic infection (sepsis) is possible, but other conditions are known to elevate PCT as well. (NOTE)       Sepsis PCT Algorithm           Lower Respiratory Tract                                      Infection PCT Algorithm    ----------------------------     ----------------------------         PCT < 0.25 ng/mL                PCT < 0.10 ng/mL          Strongly encourage             Strongly discourage   discontinuation of antibiotics    initiation of antibiotics    ----------------------------     -----------------------------       PCT 0.25 - 0.50 ng/mL            PCT 0.10 - 0.25 ng/mL               OR       >80% decrease in PCT            Discourage initiation of                                            antibiotics      Encourage discontinuation           of antibiotics    ----------------------------     -----------------------------         PCT >= 0.50 ng/mL              PCT 0.26 - 0.50 ng/mL                AND       <80% decrease in PCT             Encourage initiation of  antibiotics       Encourage continuation           of antibiotics    ----------------------------     -----------------------------        PCT >= 0.50 ng/mL                  PCT > 0.50 ng/mL               AND         increase in PCT                  Strongly encourage                                      initiation of antibiotics    Strongly encourage escalation           of antibiotics                                     -----------------------------                                           PCT <= 0.25 ng/mL                                                 OR                                        > 80% decrease in PCT                                      Discontinue / Do not initiate                                             antibiotics  Performed at [facility], [address] [place], [place] [address]   Renal function panel      Status: Abnormal   Collection Time: 08/14/19 12:53 AM  Result Value Ref Range   Sodium 144 135 - 145 mmol/L   Potassium 3.4 (L) 3.5 - 5.1 mmol/L   Chloride 109 98 - 111 mmol/L   CO2 27 22 - 32 mmol/L   Glucose, Bld 119 (H) 70 - 99 mg/dL    Comment: Glucose reference range applies only to samples taken after fasting for at least 8 hours.   BUN 8 6 - 20 mg/dL   Creatinine, Ser 0.65 0.44 - 1.00 mg/dL   Calcium 7.9 (L) 8.9 - 10.3 mg/dL   Phosphorus 2.0 (L) 2.5 - 4.6 mg/dL   Albumin 2.7 (L) 3.5 - 5.0 g/dL   GFR calc non Af Amer >60 >60 mL/min   GFR calc Af Amer >60 >60 mL/min   Anion gap 8 5 - 15  Comment: Performed at [facility], [address] [place], [place] [address]  Magnesium     Status: None   Collection Time: 08/14/19 12:53 AM  Result Value Ref Range   Magnesium 2.1 1.7 - 2.4 mg/dL    Comment: Performed at [facility], [place], [place] [address]  Heparin level (unfractionated)     Status: None   Collection Time: 08/14/19 12:53 AM  Result Value Ref Range   Heparin Unfractionated 0.32 0.30 - 0.70 IU/mL    Comment: (NOTE) If heparin results are below expected values, and patient dosage has  been confirmed, suggest follow up testing of antithrombin III levels. Performed at [facility], [address] [place], [place] [address]   Glucose, capillary     Status: Abnormal   Collection Time: 08/14/19  4:01 AM  Result Value Ref Range   Glucose-Capillary 112 (H) 70 - 99 mg/dL    Comment: Glucose reference range applies only to samples taken after fasting for at least 8 hours.  Heparin level (unfractionated)     Status: None   Collection Time: 08/14/19  5:05 AM  Result Value Ref Range   Heparin Unfractionated 0.30 0.30 - 0.70 IU/mL    Comment: (NOTE) If heparin results are below expected values, and patient dosage has  been confirmed, suggest follow up testing of antithrombin III levels. Performed at [facility],  [address] [place], [place] [address]   CBC with Differential/Platelet     Status: Abnormal   Collection Time: 08/14/19  5:05 AM  Result Value Ref Range   WBC 6.7 4.0 - 10.5 K/uL   RBC 3.52 (L) 3.87 - 5.11 MIL/uL   Hemoglobin 11.3 (L) 12.0 - 15.0 g/dL   HCT 34.8 (L) 36 - 46 %   MCV 98.9 80.0 - 100.0 fL   MCH 32.1 26.0 - 34.0 pg   MCHC 32.5 30.0 - 36.0 g/dL   RDW 13.5 11.5 - 15.5 %   Platelets 166 150 - 400 K/uL   nRBC 0.0 0.0 - 0.2 %   Neutrophils Relative % 56 %   Neutro Abs 3.7 1.7 - 7.7 K/uL   Lymphocytes Relative 33 %   Lymphs Abs 2.2 0.7 - 4.0 K/uL   Monocytes Relative 9 %   Monocytes Absolute 0.6 0 - 1 K/uL   Eosinophils Relative 1 %   Eosinophils Absolute 0.1 0 - 0 K/uL   Basophils Relative 1 %   Basophils Absolute 0.0 0 - 0 K/uL   Immature Granulocytes 0 %   Abs Immature Granulocytes 0.02 0.00 - 0.07 K/uL    Comment: Performed at [facility], [address] [place], [place] [address]  Glucose, capillary     Status: Abnormal   Collection Time: 08/14/19  8:39 AM  Result Value Ref Range   Glucose-Capillary 147 (H) 70 - 99 mg/dL    Comment: Glucose reference range applies only to samples taken after fasting for at least 8 hours.  Glucose, capillary     Status: Abnormal   Collection Time: 08/14/19 12:17 PM  Result Value Ref Range   Glucose-Capillary 155 (H) 70 - 99 mg/dL    Comment: Glucose reference range applies only to samples taken after fasting for at least 8 hours.   Comment 1 Notify RN   Glucose, capillary     Status: Abnormal   Collection Time: 08/14/19  3:59 PM  Result Value Ref Range   Glucose-Capillary 132 (H) 70 -  99 mg/dL    Comment: Glucose reference range applies only to samples taken after fasting for at least 8 hours.  Renal function panel     Status: Abnormal   Collection Time: 08/15/19  4:43 AM  Result Value Ref Range   Sodium 141 135 - 145 mmol/L   Potassium 3.6 3.5 - 5.1 mmol/L   Chloride 107 98 - 111 mmol/L   CO2 26 22 - 32  mmol/L   Glucose, Bld 96 70 - 99 mg/dL    Comment: Glucose reference range applies only to samples taken after fasting for at least 8 hours.   BUN 7 6 - 20 mg/dL   Creatinine, Ser 0.52 0.44 - 1.00 mg/dL   Calcium 8.5 (L) 8.9 - 10.3 mg/dL   Phosphorus 3.5 2.5 - 4.6 mg/dL   Albumin 2.8 (L) 3.5 - 5.0 g/dL   GFR calc non Af Amer >60 >60 mL/min   GFR calc Af Amer >60 >60 mL/min   Anion gap 8 5 - 15    Comment: Performed at [facility], [address], [place], [place] [address]  Magnesium     Status: Abnormal   Collection Time: 08/15/19  4:43 AM  Result Value Ref Range   Magnesium 1.6 (L) 1.7 - 2.4 mg/dL    Comment: Performed at [facility], [address] [place], [place] [address]  CBC     Status: Abnormal   Collection Time: 08/15/19  4:43 AM  Result Value Ref Range   WBC 7.5 4.0 - 10.5 K/uL   RBC 3.75 (L) 3.87 - 5.11 MIL/uL   Hemoglobin 12.1 12.0 - 15.0 g/dL   HCT 35.0 (L) 36 - 46 %   MCV 93.3 80.0 - 100.0 fL   MCH 32.3 26.0 - 34.0 pg   MCHC 34.6 30.0 - 36.0 g/dL   RDW 13.0 11.5 - 15.5 %   Platelets 186 150 - 400 K/uL   nRBC 0.0 0.0 - 0.2 %    Comment: Performed at [facility], [address] [place], [place] [address]  Hepatic function panel     Status: Abnormal   Collection Time: 08/15/19  4:43 AM  Result Value Ref Range   Total Protein 5.5 (L) 6.5 - 8.1 g/dL   Albumin 2.8 (L) 3.5 - 5.0 g/dL   AST 22 15 - 41 U/L   ALT 22 0 - 44 U/L   Alkaline Phosphatase 61 38 - 126 U/L   Total Bilirubin 0.8 0.3 - 1.2 mg/dL   Bilirubin, Direct <0.1 0.0 - 0.2 mg/dL   Indirect Bilirubin NOT CALCULATED 0.3 - 0.9 mg/dL    Comment: Performed at [facility], [address] [place], [place] [address]  Glucose, capillary     Status: Abnormal   Collection Time: 08/15/19  8:55 AM  Result Value Ref Range   Glucose-Capillary 115 (H) 70 - 99 mg/dL    Comment: Glucose reference range applies only to samples taken after fasting for at least 8 hours.   Glucose, capillary     Status: Abnormal   Collection Time: 08/15/19 11:52 AM  Result Value Ref Range   Glucose-Capillary 106 (H) 70 - 99 mg/dL    Comment: Glucose reference range applies only to samples taken after fasting for at least 8 hours.  Glucose, capillary     Status: None   Collection Time: 08/15/19  4:52 PM  Result Value Ref Range   Glucose-Capillary 83 70 - 99 mg/dL  Comment: Glucose reference range applies only to samples taken after fasting for at least 8 hours.   Comment 1 Notify RN    Comment 2 Document in Chart     Current Facility-Administered Medications  Medication Dose Route Frequency Provider Last Rate Last Admin   0.9 %  sodium chloride infusion   Intravenous PRN Regalado, Belkys A, MD 10 mL/hr at 08/15/19 1438 250 mL at 08/15/19 1438   acetaminophen (TYLENOL) tablet 650 mg  650 mg Oral Q6H PRN Dallie Piles, RPH   650 mg at 08/14/19 1252   aspirin EC tablet 81 mg  81 mg Oral Daily Samaan, Maged, MD   81 mg at 08/15/19 1254   atorvastatin (LIPITOR) tablet 40 mg  40 mg Oral Daily Samaan, Maged, MD   40 mg at 08/15/19 1255   ceFAZolin (ANCEF) IVPB 1 g/50 mL premix  1 g Intravenous Q8H Regalado, Belkys A, MD 100 mL/hr at 08/15/19 1441 1 g at 08/15/19 1441   docusate sodium (COLACE) capsule 100 mg  100 mg Oral BID PRN Cassandria Santee, MD       enoxaparin (LOVENOX) injection 40 mg  40 mg Subcutaneous Q24H Vallery Sa D, RPH       insulin aspart (novoLOG) injection 0-9 Units  0-9 Units Subcutaneous TID WC Dallie Piles, RPH   1 Units at 08/14/19 1639   nicotine (NICODERM CQ - dosed in mg/24 hours) patch 14 mg  14 mg Transdermal Daily Awilda Bill, NP   14 mg at 08/15/19 1255   pantoprazole (PROTONIX) injection 40 mg  40 mg Intravenous Q24H Samaan, Maged, MD   40 mg at 08/14/19 2205   polyethylene glycol (MIRALAX / GLYCOLAX) packet 17 g  17 g Oral BID Regalado, Belkys A, MD       thiamine tablet 100 mg  100 mg Oral Daily Samaan, Maged, MD   100 mg at  08/15/19 1255   vitamin B-12 (CYANOCOBALAMIN) tablet 5,000 mcg  5,000 mcg Oral Daily Dallie Piles, RPH   5,000 mcg at 08/15/19 1254    Musculoskeletal: Strength & Muscle Tone:  Needs exercise  Gait & Station: slow at times  Patient leans: n/a   Psychiatric Specialty Exam: Physical Exam  Review of Systems  Blood pressure 116/81, pulse 76, temperature 99.2 F (37.3 C), temperature source Oral, resp. rate 16, height '5\' 5"'  (1.651 m), weight 58.3 kg, SpO2 93 %.Body mass index is 21.39 kg/m.  Mental Status  Moody dysphoric, dull at baseline Oriented times four Thought process and content --no frank psychosis or mania hallucinations or delusions  Consciousness not clouded or fluctuant Concentration and attention okay Mood and affect depressed anxious and flat Insists on sister speaking for  Her and together No movements, shakes and tremors Looks haggard forlorn unkept sickly sallow wizened Rapport fair    Eye contact fair Si and HI no active SI and HI contracts for safety and insists this was not an actual suicide attempt  Judgment insight reliability fair  Memory remote recent and immediate intact  Fund of knowledge and intelligence normal Abstraction okay Speech low tone volume fluency okay somewhat mumbles But tries to be engaging    Cognition slow ADL's fairly okay Recall okay Aims not done Leans --n/a Language okay Sleep okay  Assets not clear  Family supportive  Treatment Plan Summary:   At this point all we can do is release and recommend the above   [place] live in [place] --so SW can hopefully help find nearby day treatment or related center   All three insist on no meds and they all three endorse safety at discharge without active SI HI or plans  At most this is the best we can do at this  [facility] to make supportive statements to  all three people to the best I could   IVC criteria not fully met at this point   Can go to finace ---after final medical clearance          Disposition:  Home to fiance --and hopefully they can find therapy and IOP and day treatament for concentrated groups and therapy nearby  Sometimes community mental health has this depending on funding and so on   Eulas Post, MD 08/15/2019 7:08 PM

## 2019-08-16 LAB — MAGNESIUM: Magnesium: 2 mg/dL (ref 1.7–2.4)

## 2019-08-16 LAB — BASIC METABOLIC PANEL
Anion gap: 7 (ref 5–15)
BUN: 6 mg/dL (ref 6–20)
CO2: 28 mmol/L (ref 22–32)
Calcium: 8.5 mg/dL — ABNORMAL LOW (ref 8.9–10.3)
Chloride: 107 mmol/L (ref 98–111)
Creatinine, Ser: 0.72 mg/dL (ref 0.44–1.00)
GFR calc Af Amer: >60 mL/min (ref >60)
GFR calc non Af Amer: >60 mL/min (ref >60)
Glucose, Bld: 110 mg/dL — ABNORMAL HIGH (ref 70–99)
Potassium: 3.5 mmol/L (ref 3.5–5.1)
Sodium: 142 mmol/L (ref 135–145)

## 2019-08-16 LAB — GLUCOSE, CAPILLARY
Glucose-Capillary: 117 mg/dL — ABNORMAL HIGH (ref 70–99)
Glucose-Capillary: 81 mg/dL (ref 70–99)

## 2019-08-16 MED ORDER — ATORVASTATIN CALCIUM 40 MG PO TABS: 40.0000 mg | ORAL_TABLET | Freq: Every day | ORAL | 0 refills | Status: DC

## 2019-08-16 MED ORDER — CEPHALEXIN 500 MG PO CAPS: 500.0000 mg | ORAL_CAPSULE | Freq: Two times a day (BID) | ORAL | 0 refills | Status: AC

## 2019-08-16 MED ORDER — NICOTINE 14 MG/24HR TD PT24: 14.0000 mg | MEDICATED_PATCH | Freq: Every day | TRANSDERMAL | 0 refills | Status: DC

## 2019-08-16 MED ORDER — ASPIRIN 81 MG PO TBEC: 81.0000 mg | DELAYED_RELEASE_TABLET | Freq: Every day | ORAL | 11 refills | Status: DC

## 2019-08-16 MED ORDER — DOCUSATE SODIUM 100 MG PO CAPS: 100.0000 mg | ORAL_CAPSULE | Freq: Two times a day (BID) | ORAL | 0 refills | Status: DC | PRN

## 2019-08-16 NOTE — Discharge Summary (Signed)
Physician Discharge Summary  Debbie Gay:[id number] DOB: 11/05/1970 DOA: 08/12/2019  PCP: [name], MD  Admit date: 08/12/2019 Discharge date: 08/16/2019  Admitted From: Home  Disposition:   Recommendations for Outpatient Follow-up:  1. Follow up with PCP in 1-2 weeks 2. Please obtain BMP/CBC in one week 3. Needs IOP therapy   Home Health: none  Discharge Condition: Stable.  CODE STATUS: Full code Diet recommendation: Heart Healthy   Brief/Interim Summary: 49 year old with past medical history significant for chronic tobacco abuse, COPD, bipolar disorder, depression, irritable bowel syndrome, chronic pains, benzodiazepine and narcotics dependency, thiamine deficiency and EGD disorder who presents to the emergency department on 08/12/2019 with altered mental status. As per family she was found with empty bottles of benzo and narcotic which were prescribed recently..Per notes patient initially responded to Narcan, however she had to be intubated for airway protection. Was noted to be hypotensive and was a started on vasopressors and ketamine in the ED. Of note patient had prior history of drug overdose, altered mental status which was treated. 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  Patient was admitted by critical care team, patient was treated with support care. Patient was found to have elevated troponin, cardiology was consulted plan is for stress test inpatient.  Acute respiratory failure; no intentional Overdose Intubated for air-way protection 8/10- Extubated 08/13/2019 Currently on room air.  Stable. Lung Clear on exam.   Acute metabolic encephalopathy; related to overdose;  CT head negative.  Thiamine pending.  On oral thiamine.  Patient was clear by psych for discharge.  Back to baseline.   Depression, Unintentional overdose.  Clear by psych.  Psych discussed with patient and family, they decline medications management at this time.    Elevated Troponin- NSTEMI-Demand ischemia; -stress test inpatient.  No significant evidence of ischemia, low risk stress test results. -No acute evaluation by cardiology. -ECHO EF%%%  Klebsiella UTI; Sepsis; lactic acidosis.  Patient with fever, tachypnea, Tachycardia.  UA; grew Klebsiella PNeumonia. 6-10 WBC,  Plan to complete 5 days treatment. She will be discharge on 2 more days of keflex.   Hyperbilirubinemia; mild bili at 1.3  Flow trend.   Hypomagnesemia; replete IV   Anemia;  Monitor hb  Abdominal pain; no BM. Check KUB.  IV protonix.  Resolved/.    Discharge Diagnoses:  Active Problems:   Acute respiratory failure (HCC)   Elevated troponin    Discharge Instructions  Discharge Instructions    Diet - low sodium heart healthy   Complete by: As directed    Increase activity slowly   Complete by: As directed      Allergies as of 08/16/2019      Reactions   Levaquin [levofloxacin] Swelling   Prednisone Shortness Of Breath, Swelling   Penicillins    Has patient had a PCN reaction causing immediate rash, facial/tongue/throat swelling, SOB or lightheadedness with hypotension: Unknown Has patient had a PCN reaction causing severe rash involving mucus membranes or skin necrosis: Unknown Has patient had a PCN reaction that required hospitalization: Unknown Has patient had a PCN reaction occurring within the last 10 years: Unknown If all of the above answers are "NO", then may proceed with Cephalosporin use.      Medication List    STOP taking these medications   ALPRAZolam 0.5 MG tablet Commonly known as: XANAX   Klor-Con M10 10 MEQ tablet Generic drug: potassium chloride   metoCLOPramide 10 MG tablet Commonly known as: REGLAN   Oxycodone HCl 10 MG  Tabs   QUEtiapine 100 MG tablet Commonly known as: SEROQUEL     TAKE these medications   aspirin 81 MG EC tablet Take 1 tablet (81 mg total) by mouth daily. Swallow whole.   atorvastatin 40 MG  tablet Commonly known as: LIPITOR Take 1 tablet (40 mg total) by mouth daily.   cephALEXin 500 MG capsule Commonly known as: KEFLEX Take 1 capsule (500 mg total) by mouth 2 (two) times daily for 10 days.   docusate sodium 100 MG capsule Commonly known as: COLACE Take 1 capsule (100 mg total) by mouth 2 (two) times daily as needed for mild constipation.   nicotine 14 mg/24hr patch Commonly known as: NICODERM CQ - dosed in mg/24 hours Place 1 patch (14 mg total) onto the skin daily.   pantoprazole 40 MG tablet Commonly known as: PROTONIX Take 40 mg by mouth 2 (two) times daily.   thiamine 100 MG tablet Commonly known as: Vitamin B-1 Take 1 tablet (100 mg total) by mouth daily.   vitamin B-12 1000 MCG tablet Commonly known as: CYANOCOBALAMIN Take 5,000 mcg by mouth daily.       Follow-up Information    [name], MD Follow up in 3 day(s).   Specialty: Family Medicine Contact information: [address] [place] [address] [phone]              Allergies  Allergen Reactions  . Levaquin [Levofloxacin] Swelling  . Prednisone Shortness Of Breath and Swelling  . Penicillins     Has patient had a PCN reaction causing immediate rash, facial/tongue/throat swelling, SOB or lightheadedness with hypotension: Unknown Has patient had a PCN reaction causing severe rash involving mucus membranes or skin necrosis: Unknown Has patient had a PCN reaction that required hospitalization: Unknown Has patient had a PCN reaction occurring within the last 10 years: Unknown If all of the above answers are "NO", then may proceed with Cephalosporin use.    Consultations:  Psych  CCM admitted patient    Procedures/Studies: DG Abd 1 View  Result Date: 08/15/2019 CLINICAL DATA:  Generalized abdominal pain EXAM: ABDOMEN - 1 VIEW COMPARISON:  None. FINDINGS: Scattered large and small bowel gas is noted. No significant retained fecal material is noted. No mass lesion or  abnormal calcifications are noted. No free air is seen. No bony abnormality is noted. IMPRESSION: No acute abnormality noted. Electronically Signed   By: Alcide Clever M.D.   On: 08/15/2019 14:16   CT Head Wo Contrast  Result Date: 08/12/2019 CLINICAL DATA:  Mental status change. EXAM: CT HEAD WITHOUT CONTRAST TECHNIQUE: Contiguous axial images were obtained from the base of the skull through the vertex without intravenous contrast. COMPARISON:  08/04/2019 MRI head and prior. 03/04/2019 CT head and prior. FINDINGS: Brain: No acute infarct or intracranial hemorrhage. No mass lesion. No midline shift, ventriculomegaly or extra-axial fluid collection. Vascular: No hyperdense vessel or unexpected calcification. Skull: Negative for fracture or focal lesion. Sinuses/Orbits: No acute orbital findings. Clear paranasal sinuses. No mastoid effusion. Other: None. IMPRESSION: No acute intracranial pathology. Electronically Signed   By: Stana Bunting M.D.   On: 08/12/2019 12:36   MR BRAIN WO CONTRAST  Result Date: 08/04/2019  [facility] [address] [place], [place] [address] [phone] NEUROIMAGING REPORT STUDY DATE: 08/04/2019 PATIENT NAME: KENIKA SAHM DOB: 1970-04-07 MRN: [id number] EXAM: MRI Brain without contrast ORDERING CLINICIAN: Margie Ege, NP CLINICAL HISTORY: 49 year old woman with memory loss COMPARISON FILMS: MRI 04/24/2018 TECHNIQUE: MRI of  the brain without contrast was obtained utilizing 5 mm axial slices with T1, T2, T2 flair, SWI and diffusion weighted views.  T1 sagittal and T2 coronal views were obtained. CONTRAST: none IMAGING SITE: [facility], [address] [place], [place] FINDINGS: On sagittal images, the spinal cord is imaged caudally to C3 and is normal in caliber.   The contents of the posterior fossa are of normal size and position.   The pituitary gland and optic chiasm appear normal.    Brain volume appears normal.   The ventricles are normal in size  and without distortion.  There are no abnormal extra-axial collections of fluid.  The cerebellum and brainstem appears normal.   The deep gray matter appears normal.  The cerebral hemispheres appear normal.  Diffusion weighted images are normal.  Susceptibility weighted images are normal.  The orbits appear normal.   The VIIth/VIIIth nerve complex appears normal. There is a mild left mastoid effusion not present on the previous MRI. The right mastoid air cells appear normal. Small mucous retention cysts are noted at the floor of the right maxillary sinus. The other paranasal sinuses appear normal.  Flow voids are identified within the major intracerebral arteries.     This is a normal MRI of the brain without contrast. 1.   Brain parenchyma appears normal. 2.   Incidental note is made of a mild left mastoid effusion of doubtful significance. This is likely due to mild eustachian tube dysfunction. 3.   No acute findings. INTERPRETING PHYSICIAN: Richard A. Sater, MD, PhD, FAAN Certified in  Neuroimaging by [employer]   NM Myocar Multi W/Spect W/Wall Motion / EF  Result Date: 08/15/2019 Pharmacological myocardial perfusion imaging study with no significant  Ischemia GI uptake artifact noted Normal wall motion, EF estimated at 50% No EKG changes concerning for ischemia at peak stress or in recovery. Low risk scan Signed, Dossie Arbour, MD, Ph.D [facility]   DG Chest [place] 1 View  Result Date: 08/14/2019 CLINICAL DATA:  Acute respiratory failure, asthma, COPD, history MI EXAM: PORTABLE CHEST 1 VIEW COMPARISON:  Portable exam 0953 hours compared to 08/12/2019 FINDINGS: Interval removal of endotracheal tube, nasogastric tube and RIGHT jugular line. Normal heart size, mediastinal contours, and pulmonary vascularity. Bibasilar atelectasis. No acute infiltrate, pleural effusion or pneumothorax. Multiple old LEFT rib fractures with prior ORIF. IMPRESSION: Bibasilar atelectasis. Electronically  Signed   By: Ulyses Southward M.D.   On: 08/14/2019 10:30   DG Chest Portable 1 View  Result Date: 08/12/2019 CLINICAL DATA:  Hypoxia.  Central catheter placement EXAM: PORTABLE CHEST 1 VIEW COMPARISON:  August 12, 2019 study obtained earlier in the day FINDINGS: Central catheter tip is in the superior vena cava. Endotracheal tube tip is 4.4 cm above the carina. Nasogastric tube tip and side port are below the diaphragm. No pneumothorax. There is no edema or airspace opacity. Heart size and pulmonary vascularity are normal. No adenopathy. Postoperative changes noted in several ribs on the left inferiorly, stable. IMPRESSION: Tube and catheter positions as described without pneumothorax. No edema or airspace opacity. Stable cardiac silhouette. Electronically Signed   By: Bretta Bang III M.D.   On: 08/12/2019 15:41   DG Chest Port 1 View  Result Date: 08/12/2019 CLINICAL DATA:  Altered mental status EXAM: PORTABLE CHEST 1 VIEW COMPARISON:  08/12/2019 FINDINGS: Endotracheal tube terminates 3.8 cm above the carina. Enteric tube distal tip and side hole within the proximal stomach. Normal heart size. Mild bibasilar opacities with improving aeration  of the left lung base. No pleural effusion or pneumothorax. Prior ORIF of multiple lower left ribs. IMPRESSION: 1. Support lines and tubes in satisfactory position. 2. Mild bibasilar opacities with improving aeration of the left lung base. Electronically Signed   By: Duanne Guess D.O.   On: 08/12/2019 11:16   DG Chest Portable 1 View  Result Date: 08/12/2019 CLINICAL DATA:  Hypoxia EXAM: PORTABLE CHEST 1 VIEW COMPARISON:  August 12, 2019 study obtained earlier in the day FINDINGS: Endotracheal tube tip is 3.5 cm above the carina. Nasogastric tube tip and side port are below the diaphragm. No pneumothorax. There is mild left base atelectasis. The lungs elsewhere are clear. Heart size and pulmonary vascularity are normal. No adenopathy. No bone lesions.  IMPRESSION: Tube positions as described without pneumothorax. Left base atelectasis. Lungs otherwise clear. Cardiac silhouette normal. Electronically Signed   By: Bretta Bang III M.D.   On: 08/12/2019 10:24   DG Chest Portable 1 View  Result Date: 08/12/2019 CLINICAL DATA:  Unresponsive. EXAM: PORTABLE CHEST 1 VIEW COMPARISON:  03/04/2019 FINDINGS: 0937 hours. Mild hyperexpansion. The cardiopericardial silhouette is within normal limits for size. Interstitial markings are diffusely coarsened with chronic features. Streaky opacity at the left base suggest atelectasis. The lungs are otherwise clear without focal pneumonia, edema, pneumothorax or pleural effusion. The visualized bony structures of the thorax show now acute abnormality. Telemetry leads overlie the chest. IMPRESSION: Hyperexpansion with chronic interstitial changes and left base atelectasis. Electronically Signed   By: Kennith Center M.D.   On: 08/12/2019 09:58   ECHOCARDIOGRAM COMPLETE  Result Date: 08/13/2019    ECHOCARDIOGRAM REPORT   Patient Name:   DARIS ARISTIZABAL Date of Exam: 08/13/2019 Medical Rec #:  [id number]      Height:       65.0 in Accession #:    [id number]     Weight:       128.3 lb Date of Birth:  1970/05/22      BSA:          1.638 m Patient Age:    49 years       BP:           118/74 mmHg Patient Gender: F              HR:           79 bpm. Exam Location:  [facility] Procedure: 2D Echo, Cardiac Doppler and Color Doppler Indications:     Elevated troponin  History:         Patient has no prior history of Echocardiogram examinations.                  Previous Myocardial Infarction; COPD.  Sonographer:     Cristela Blue RDCS (AE) Referring Phys:  [phone] CHRISTOPHER END Diagnosing Phys: Debbe Odea MD IMPRESSIONS  1. Left ventricular ejection fraction, by estimation, is 50 to 55%. The left ventricle has low normal function. The left ventricle has no regional wall motion abnormalities. Left ventricular diastolic parameters were  normal.  2. Right ventricular systolic function is normal. The right ventricular size is normal. There is normal pulmonary artery systolic pressure.  3. The mitral valve is normal in structure. No evidence of mitral valve regurgitation. No evidence of mitral stenosis.  4. The aortic valve is grossly normal. Aortic valve regurgitation is not visualized. No aortic stenosis is present.  5. The inferior vena cava is normal in size with greater than 50% respiratory variability, suggesting right  atrial pressure of 3 mmHg. FINDINGS  Left Ventricle: Left ventricular ejection fraction, by estimation, is 50 to 55%. The left ventricle has low normal function. The left ventricle has no regional wall motion abnormalities. The left ventricular internal cavity size was normal in size. There is no left ventricular hypertrophy. Left ventricular diastolic parameters were normal. Right Ventricle: The right ventricular size is normal. No increase in right ventricular wall thickness. Right ventricular systolic function is normal. There is normal pulmonary artery systolic pressure. The tricuspid regurgitant velocity is 1.91 m/s, and  with an assumed right atrial pressure of 3 mmHg, the estimated right ventricular systolic pressure is 17.6 mmHg. Left Atrium: Left atrial size was normal in size. Right Atrium: Right atrial size was normal in size. Pericardium: There is no evidence of pericardial effusion. Mitral Valve: The mitral valve is normal in structure. Normal mobility of the mitral valve leaflets. No evidence of mitral valve regurgitation. No evidence of mitral valve stenosis. Tricuspid Valve: The tricuspid valve is normal in structure. Tricuspid valve regurgitation is not demonstrated. No evidence of tricuspid stenosis. Aortic Valve: The aortic valve is grossly normal. Aortic valve regurgitation is not visualized. No aortic stenosis is present. Aortic valve mean gradient measures 4.3 mmHg. Aortic valve peak gradient measures 8.4  mmHg. Aortic valve area, by VTI measures 2.30 cm. Pulmonic Valve: The pulmonic valve was not well visualized. Pulmonic valve regurgitation is not visualized. No evidence of pulmonic stenosis. Aorta: The aortic root is normal in size and structure. Venous: The inferior vena cava is normal in size with greater than 50% respiratory variability, suggesting right atrial pressure of 3 mmHg. IAS/Shunts: No atrial level shunt detected by color flow Doppler.  LEFT VENTRICLE PLAX 2D LVIDd:         5.03 cm  Diastology LVIDs:         3.61 cm  LV e' lateral:   11.70 cm/s LV PW:         1.22 cm  LV E/e' lateral: 7.1 LV IVS:        1.00 cm  LV e' medial:    10.10 cm/s LVOT diam:     2.00 cm  LV E/e' medial:  8.2 LV SV:         59 LV SV Index:   36 LVOT Area:     3.14 cm  RIGHT VENTRICLE RV Basal diam:  3.45 cm RV S prime:     12.30 cm/s TAPSE (M-mode): 3.0 cm LEFT ATRIUM             Index       RIGHT ATRIUM           Index LA diam:        2.60 cm 1.59 cm/m  RA Area:     16.10 cm LA Vol (A2C):   35.9 ml 21.92 ml/m RA Volume:   50.90 ml  31.07 ml/m LA Vol (A4C):   20.9 ml 12.76 ml/m LA Biplane Vol: 27.9 ml 17.03 ml/m  AORTIC VALVE                   PULMONIC VALVE AV Area (Vmax):    1.77 cm    PV Vmax:       0.56 m/s AV Area (Vmean):   1.79 cm    PV Peak grad:  1.3 mmHg AV Area (VTI):     2.30 cm AV Vmax:           145.33 cm/s AV Vmean:  93.933 cm/s AV VTI:            0.258 m AV Peak Grad:      8.4 mmHg AV Mean Grad:      4.3 mmHg LVOT Vmax:         82.00 cm/s LVOT Vmean:        53.600 cm/s LVOT VTI:          0.189 m LVOT/AV VTI ratio: 0.73  AORTA Ao Root diam: 2.40 cm MITRAL VALVE               TRICUSPID VALVE MV Area (PHT): 5.27 cm    TR Peak grad:   14.6 mmHg MV Decel Time: 144 msec    TR Vmax:        191.00 cm/s MV E velocity: 82.50 cm/s MV A velocity: 71.80 cm/s  SHUNTS MV E/A ratio:  1.15        Systemic VTI:  0.19 m                            Systemic Diam: 2.00 cm Debbe Odea MD Electronically signed  by Debbe Odea MD Signature Date/Time: 08/13/2019/3:10:11 PM    Final       Subjective: She denies abdominal pain. She had BM  Discharge Exam: Vitals:   08/16/19 0821 08/16/19 1228  BP: 119/79 120/84  Pulse: 70 75  Resp: 16   Temp: 98.4 F (36.9 C) 100.1 F (37.8 C)  SpO2: 95% 96%     General: Pt is alert, awake, not in acute distress Cardiovascular: RRR, S1/S2 +, no rubs, no gallops Respiratory: CTA bilaterally, no wheezing, no rhonchi Abdominal: Soft, NT, ND, bowel sounds + Extremities: no edema, no cyanosis    The results of significant diagnostics from this hospitalization (including imaging, microbiology, ancillary and laboratory) are listed below for reference.     Microbiology: Recent Results (from the past 240 hour(s))  Blood culture (routine single)     Status: None (Preliminary result)   Collection Time: 08/12/19  9:34 AM   Specimen: BLOOD  Result Value Ref Range Status   Specimen Description BLOOD RIGHT ANTECUBITAL  Final   Special Requests   Final    BOTTLES DRAWN AEROBIC AND ANAEROBIC Blood Culture adequate volume   Culture   Final    NO GROWTH 4 DAYS Performed at [facility], [address] [place], [place] [address]    Report Status PENDING  Incomplete  Urine culture     Status: Abnormal   Collection Time: 08/12/19  9:35 AM   Specimen: In/Out Cath Urine  Result Value Ref Range Status   Specimen Description   Final    IN/OUT CATH URINE Performed at [facility], [address] [place], [place] [address]    Special Requests   Final    NONE Performed at [facility], [address] [place], [place] [address]    Culture >=100,000 COLONIES/mL KLEBSIELLA PNEUMONIAE (A)  Final   Report Status 08/14/2019 FINAL  Final   Organism ID, Bacteria KLEBSIELLA PNEUMONIAE (A)  Final      Susceptibility   Klebsiella pneumoniae - MIC*    AMPICILLIN RESISTANT Resistant     CEFAZOLIN <=4 SENSITIVE Sensitive      CEFTRIAXONE <=0.25 SENSITIVE Sensitive     CIPROFLOXACIN <=0.25 SENSITIVE Sensitive     GENTAMICIN <=1 SENSITIVE Sensitive     IMIPENEM <=0.25 SENSITIVE Sensitive  NITROFURANTOIN 64 INTERMEDIATE Intermediate     TRIMETH/SULFA <=20 SENSITIVE Sensitive     AMPICILLIN/SULBACTAM 8 SENSITIVE Sensitive     PIP/TAZO <=4 SENSITIVE Sensitive     * >=100,000 COLONIES/mL KLEBSIELLA PNEUMONIAE  SARS Coronavirus 2 by RT PCR (hospital order, performed in [facility] hospital lab) Nasopharyngeal Nasopharyngeal Swab     Status: None   Collection Time: 08/12/19 10:00 AM   Specimen: Nasopharyngeal Swab  Result Value Ref Range Status   SARS Coronavirus 2 NEGATIVE NEGATIVE Final    Comment: (NOTE) SARS-CoV-2 target nucleic acids are NOT DETECTED.  The SARS-CoV-2 RNA is generally detectable in upper and lower respiratory specimens during the acute phase of infection. The lowest concentration of SARS-CoV-2 viral copies this assay can detect is 250 copies / mL. A negative result does not preclude SARS-CoV-2 infection and should not be used as the sole basis for treatment or other patient management decisions.  A negative result may occur with improper specimen collection / handling, submission of specimen other than nasopharyngeal swab, presence of viral mutation(s) within the areas targeted by this assay, and inadequate number of viral copies (<250 copies / mL). A negative result must be combined with clinical observations, patient history, and epidemiological information.  Fact Sheet for Patients:   BoilerBrush.com.cy  Fact Sheet for Healthcare Providers: https://pope.com/  This test is not yet approved or  cleared by the [place] FDA and has been authorized for detection and/or diagnosis of SARS-CoV-2 by FDA under an Emergency Use Authorization (EUA).  This EUA will remain in effect (meaning this test can be used) for the duration of  the COVID-19 declaration under Section 564(b)(1) of the Act, 21 U.S.C. section 360bbb-3(b)(1), unless the authorization is terminated or revoked sooner.  Performed at [facility], [address] [place], [place] [address]   MRSA PCR Screening     Status: None   Collection Time: 08/13/19  1:46 AM   Specimen: Nasopharyngeal  Result Value Ref Range Status   MRSA by PCR NEGATIVE NEGATIVE Final    Comment:        The GeneXpert MRSA Assay (FDA approved for NASAL specimens only), is one component of a comprehensive MRSA colonization surveillance program. It is not intended to diagnose MRSA infection nor to guide or monitor treatment for MRSA infections. Performed at [facility], [address] [place], [place] [address]      Labs: BNP (last 3 results) Recent Labs    03/04/19 1016  BNP 105.0*   Basic Metabolic Panel: Recent Labs  Lab 08/12/19 0934 08/12/19 1913 08/13/19 0430 08/14/19 0053 08/15/19 0443 08/16/19 0645  NA 142  --  141 144 141 142  K 4.5  --  3.2* 3.4* 3.6 3.5  CL 105  --  111 109 107 107  CO2 22  --  21* GLUCOSE 278*  --  99 119* 96 110*  BUN 13  --  CREATININE 1.72*  --  0.95 0.65 0.52 0.72  CALCIUM 8.3*  --  7.6* 7.9* 8.5* 8.5*  MG  --   --  1.5* 2.1 1.6* 2.0  PHOS  --  2.8  --  2.0* 3.5  --    Liver Function Tests: Recent Labs  Lab 08/12/19 0934 08/13/19 0430 08/14/19 0053 08/15/19 0443  AST 55* 34  --  22  ALT 29 25  --  22  ALKPHOS 69 49  --  61  BILITOT 1.3*  1.2  --  0.8  PROT 6.3* 5.0*  --  5.5*  ALBUMIN 3.4* 2.8* 2.7* 2.8*  2.8*   No results for input(s): LIPASE, AMYLASE in the last 168 hours. No results for input(s): AMMONIA in the last 168 hours. CBC: Recent Labs  Lab 08/12/19 0934 08/13/19 0430 08/14/19 0505 08/15/19 0443  WBC 12.9* 8.7 6.7 7.5  NEUTROABS 11.2* 5.9 3.7  --   HGB 14.1 11.7* 11.3* 12.1  HCT 44.1 34.9* 34.8* 35.0*  MCV 100.0 96.7 98.9 93.3  PLT 224 186 166 186    Cardiac Enzymes: Recent Labs  Lab 08/12/19 1907  CKTOTAL 137   BNP: Invalid input(s): POCBNP CBG: Recent Labs  Lab 08/14/19 1559 08/15/19 0855 08/15/19 1152 08/15/19 1652 08/16/19 0804  GLUCAP 132* 115* 106* 83 117*   D-Dimer No results for input(s): DDIMER in the last 72 hours. Hgb A1c No results for input(s): HGBA1C in the last 72 hours. Lipid Profile No results for input(s): CHOL, HDL, LDLCALC, TRIG, CHOLHDL, LDLDIRECT in the last 72 hours. Thyroid function studies No results for input(s): TSH, T4TOTAL, T3FREE, THYROIDAB in the last 72 hours.  Invalid input(s): FREET3 Anemia work up No results for input(s): VITAMINB12, FOLATE, FERRITIN, TIBC, IRON, RETICCTPCT in the last 72 hours. Urinalysis    Component Value Date/Time   COLORURINE YELLOW (A) 08/12/2019 0935   APPEARANCEUR HAZY (A) 08/12/2019 0935   APPEARANCEUR Hazy 08/19/2012 1509   LABSPEC 1.010 08/12/2019 0935   LABSPEC 1.010 08/19/2012 1509   PHURINE 6.0 08/12/2019 0935   GLUCOSEU >=500 (A) 08/12/2019 0935   GLUCOSEU Negative 08/19/2012 1509   HGBUR MODERATE (A) 08/12/2019 0935   BILIRUBINUR NEGATIVE 08/12/2019 0935   BILIRUBINUR Negative 08/19/2012 1509   KETONESUR NEGATIVE 08/12/2019 0935   PROTEINUR 30 (A) 08/12/2019 0935   NITRITE NEGATIVE 08/12/2019 0935   LEUKOCYTESUR NEGATIVE 08/12/2019 0935   LEUKOCYTESUR Negative 08/19/2012 1509   Sepsis Labs Invalid input(s): PROCALCITONIN,  WBC,  LACTICIDVEN Microbiology Recent Results (from the past 240 hour(s))  Blood culture (routine single)     Status: None (Preliminary result)   Collection Time: 08/12/19  9:34 AM   Specimen: BLOOD  Result Value Ref Range Status   Specimen Description BLOOD RIGHT ANTECUBITAL  Final   Special Requests   Final    BOTTLES DRAWN AEROBIC AND ANAEROBIC Blood Culture adequate volume   Culture   Final    NO GROWTH 4 DAYS Performed at [facility], [address], [place], [place] [address]    Report Status  PENDING  Incomplete  Urine culture     Status: Abnormal   Collection Time: 08/12/19  9:35 AM   Specimen: In/Out Cath Urine  Result Value Ref Range Status   Specimen Description   Final    IN/OUT CATH URINE Performed at [facility], [address], [place], [place] [address]    Special Requests   Final    NONE Performed at [facility], [address] [place], [place] [address]    Culture >=100,000 COLONIES/mL KLEBSIELLA PNEUMONIAE (A)  Final   Report Status 08/14/2019 FINAL  Final   Organism ID, Bacteria KLEBSIELLA PNEUMONIAE (A)  Final      Susceptibility   Klebsiella pneumoniae - MIC*    AMPICILLIN RESISTANT Resistant     CEFAZOLIN <=4 SENSITIVE Sensitive     CEFTRIAXONE <=0.25 SENSITIVE Sensitive     CIPROFLOXACIN <=0.25 SENSITIVE Sensitive     GENTAMICIN <=1 SENSITIVE Sensitive     IMIPENEM <=0.25  SENSITIVE Sensitive     NITROFURANTOIN 64 INTERMEDIATE Intermediate     TRIMETH/SULFA <=20 SENSITIVE Sensitive     AMPICILLIN/SULBACTAM 8 SENSITIVE Sensitive     PIP/TAZO <=4 SENSITIVE Sensitive     * >=100,000 COLONIES/mL KLEBSIELLA PNEUMONIAE  SARS Coronavirus 2 by RT PCR (hospital order, [facility]) Nasopharyngeal Nasopharyngeal Swab     Status: None   Collection Time: 08/12/19 10:00 AM   Specimen: Nasopharyngeal Swab  Result Value Ref Range Status   SARS Coronavirus 2 NEGATIVE NEGATIVE Final    Comment: (NOTE) SARS-CoV-2 target nucleic acids are NOT DETECTED.  The SARS-CoV-2 RNA is generally detectable in upper and lower respiratory specimens during the acute phase of infection. The lowest concentration of SARS-CoV-2 viral copies this assay can detect is 250 copies / mL. A negative result does not preclude SARS-CoV-2 infection and should not be used as the sole basis for treatment or other patient management decisions.  A negative result may occur with improper specimen collection / handling, submission of specimen  other than nasopharyngeal swab, presence of viral mutation(s) within the areas targeted by this assay, and inadequate number of viral copies (<250 copies / mL). A negative result must be combined with clinical observations, patient history, and epidemiological information.  Fact Sheet for Patients:   BoilerBrush.com.cyhttps://www.fda.gov/media/136312/download  Fact Sheet for Healthcare Providers: https://pope.com/https://www.fda.gov/media/136313/download  This test is not yet approved or  cleared by the [place] FDA and has been authorized for detection and/or diagnosis of SARS-CoV-2 by FDA under an Emergency Use Authorization (EUA).  This EUA will remain in effect (meaning this test can be used) for the duration of the COVID-19 declaration under Section 564(b)(1) of the Act, 21 U.S.C. section 360bbb-3(b)(1), unless the authorization is terminated or revoked sooner.  Performed at [facility], [address] [place], [place] [address]   MRSA PCR Screening     Status: None   Collection Time: 08/13/19  1:46 AM   Specimen: Nasopharyngeal  Result Value Ref Range Status   MRSA by PCR NEGATIVE NEGATIVE Final    Comment:        The GeneXpert MRSA Assay (FDA approved for NASAL specimens only), is one component of a comprehensive MRSA colonization surveillance program. It is not intended to diagnose MRSA infection nor to guide or monitor treatment for MRSA infections. Performed at [facility], [address] [place], [place] [address]      Time coordinating discharge: 40 minutes  SIGNED:   Alba CoryBelkys A Dollie Mayse, MD  [facility]

## 2019-08-16 NOTE — TOC Progression Note (Signed)
Transition of Care [facility]) - Progression Note    Patient Details  Name: Debbie Gay MRN: [id number] Date of Birth: February 10, 1970  Transition [facility]) CM/SW Contact  Maud Deed, [place] Phone Number: 08/16/2019, 12:37 PM  Clinical Narrative:    CSW provided pt with inpatient and outpatient substance use resources. Pt was appreciative and had no questions.    Expected Discharge Plan: Home/Self Care Barriers to Discharge: No Barriers Identified  Expected Discharge Plan and Services Expected Discharge Plan: Home/Self Care         Expected Discharge Date: 08/16/19                                     Social Determinants of Health (SDOH) Interventions    Readmission Risk Interventions No flowsheet data found.

## 2019-08-17 LAB — MISC LABCORP TEST (SEND OUT): Labcorp test code: 763896

## 2019-08-17 LAB — CULTURE, BLOOD (SINGLE)
Culture: NO GROWTH
Special Requests: ADEQUATE

## 2019-08-19 ENCOUNTER — Other Ambulatory Visit: Payer: Medicaid Other

## 2019-08-26 ENCOUNTER — Ambulatory Visit: Payer: Medicaid Other | Admitting: Neurology

## 2019-09-24 NOTE — Progress Notes (Signed)
I have reviewed and agreed above plan.  MRI brain August 04 2019: IMPRESSION: This is a normal MRI of the brain without contrast.  1.   Brain parenchyma appears normal.  2.   Incidental note is made of a mild left mastoid effusion of doubtful significance. This is likely due to mild eustachian tube dysfunction. 3.   No acute findings.

## 2019-10-14 ENCOUNTER — Ambulatory Visit: Payer: Medicaid Other | Admitting: Neurology

## 2019-10-14 ENCOUNTER — Encounter: Payer: Self-pay | Admitting: Neurology

## 2019-10-14 VITALS — BP 93/59 | HR 86 | Ht 65.0 in | Wt 125.0 lb

## 2019-10-14 DIAGNOSIS — R430 Anosmia: Secondary | ICD-10-CM

## 2019-10-14 DIAGNOSIS — R413 Other amnesia: Secondary | ICD-10-CM | POA: Diagnosis not present

## 2019-10-14 NOTE — Progress Notes (Signed)
HISTORY OF PRESENT ILLNESS:  Hospitalized in March 2021 after her fianc found her passed out, could not wake her, she was diaphoretic, shallow breathing, he did chest compressions for about 3 minutes, she awoke, EMS reported CBG 449.  At that time was taking Seroquel, oxycodone, Xanax, nortriptyline, and Reglan, UDS positive for benzos opiates.  Had a visitor in the hospital, after the visit her left, she was found obtunded with low oxygen, she was given Narcan with improved responsiveness.  She signed out AMA.  She presents today accompanied by her boyfriend, Pilar Plate.  She continues to have difficulty with short-term memory, for example, forgot today's appointment.  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  She continues to have GI issues, no appetite, feeling nauseated.  She is drinking mountain dew.  Indicates no longer having headache.  Walking is doing well, no falls.  She is off nortriptyline.  Of most concern, no sense of taste or smell since incident in November 2019, but says she is just noticed this over the last several months.  When turning the head quickly from side to side, she may feel swimmy headed, get nauseated.  She had CT head in March 2021, was normal.  04/30/2018 Dr. Krista Blue: She continues working with her GI physician, has better appetite now, but still has frequent nausea, is taking Zofran twice a day, also complains almost constant pressure headaches, had a history of chronic low back pain, taking oxycodone 10 mg 1 and half tablet twice a day,  I reviewed laboratory evaluation in April 2020, elevated B12, B1, likely due to supplement, otherwise normal ESR, C-reactive protein, negative RPR  I also personally reviewed MRI of the brain on April 24, 2018, in comparison to previous MRI of the brain November 2019, this is essentially normal, previously medial dorsal  thalamic hyperintensity signal changes has resolved.   UPDATE Oct 14 2019: She has has lost sense of taste and smell, there was only a little recovery,  Personally reviewed MRI of the brain in August 2021, brain parenchyma appears normal, incidentally noted are left mastoid effusion, there was no acute abnormality.  Laboratory evaluation showed normal CBC, hemoglobin of 12.1, normal BMP, glucose of 110, calcium of 8.5   REVIEW OF SYSTEMS: Out of a complete 14 system review of symptoms, the patient complains only of the following symptoms, and all other reviewed systems are negative.  Memory loss  ALLERGIES: Allergies  Allergen Reactions  . Levaquin [Levofloxacin] Swelling  . Prednisone Shortness Of Breath and Swelling  . Penicillins     Has patient had a PCN reaction causing immediate rash, facial/tongue/throat swelling, SOB or lightheadedness with hypotension: Unknown Has patient had a PCN reaction causing severe rash involving mucus membranes or skin necrosis: Unknown Has patient had a PCN reaction that required hospitalization: Unknown Has patient had a PCN reaction occurring within the last 10 years: Unknown If all of the above answers are "NO", then may proceed with Cephalosporin use.    HOME MEDICATIONS: Outpatient Medications Prior to Visit  Medication Sig Dispense Refill  . ALPRAZolam (XANAX) 0.5 MG tablet Take 0.5 mg by mouth daily as needed.    . Ascorbic Acid (VITAMIN C PO) Take 1 tablet by mouth daily.    Marland Kitchen FLUoxetine (PROZAC) 20 MG capsule Take 20 mg by mouth daily.    . Oxycodone HCl 10 MG TABS Take 10  mg by mouth 4 (four) times daily as needed.    . pantoprazole (PROTONIX) 40 MG tablet Take 40 mg by mouth 2 (two) times daily.    . QUEtiapine (SEROQUEL) 200 MG tablet Take 200 mg by mouth at bedtime.    . thiamine (VITAMIN B-1) 100 MG tablet Take 1 tablet (100 mg total) by mouth daily. 30 tablet 0  . vitamin B-12 (CYANOCOBALAMIN) 1000 MCG tablet Take 5,000 mcg by  mouth daily.    Marland Kitchen aspirin EC 81 MG EC tablet Take 1 tablet (81 mg total) by mouth daily. Swallow whole. 30 tablet 11  . atorvastatin (LIPITOR) 40 MG tablet Take 1 tablet (40 mg total) by mouth daily. 30 tablet 0  . docusate sodium (COLACE) 100 MG capsule Take 1 capsule (100 mg total) by mouth 2 (two) times daily as needed for mild constipation. 10 capsule 0  . nicotine (NICODERM CQ - DOSED IN MG/24 HOURS) 14 mg/24hr patch Place 1 patch (14 mg total) onto the skin daily. 28 patch 0   No facility-administered medications prior to visit.    PAST MEDICAL HISTORY: Past Medical History:  Diagnosis Date  . Asthma   . Bipolar 1 disorder ([place])   . Chronic pain   . COPD (chronic obstructive pulmonary disease) ([place])   . Depression   . GERD (gastroesophageal reflux disease)   . History of heart attack   . IBS (irritable bowel syndrome)   . Myocardial infarction ([place])   . Panic attacks     PAST SURGICAL HISTORY: Past Surgical History:  Procedure Laterality Date  . ABDOMINAL HYSTERECTOMY    . COLONOSCOPY WITH PROPOFOL N/A 10/30/2014   Procedure: COLONOSCOPY WITH PROPOFOL;  Surgeon: Lollie Sails, MD;  Location: [facility] ENDOSCOPY;  Service: Endoscopy;  Laterality: N/A;  . COLONOSCOPY WITH PROPOFOL N/A 02/05/2018   Procedure: COLONOSCOPY WITH PROPOFOL;  Surgeon: Lollie Sails, MD;  Location: [facility] ENDOSCOPY;  Service: Endoscopy;  Laterality: N/A;  . ESOPHAGOGASTRODUODENOSCOPY (EGD) WITH PROPOFOL N/A 10/30/2014   Procedure: ESOPHAGOGASTRODUODENOSCOPY (EGD) WITH PROPOFOL;  Surgeon: Lollie Sails, MD;  Location: [facility] ENDOSCOPY;  Service: Endoscopy;  Laterality: N/A;  . ESOPHAGOGASTRODUODENOSCOPY (EGD) WITH PROPOFOL N/A 11/23/2017   Procedure: ESOPHAGOGASTRODUODENOSCOPY (EGD) WITH PROPOFOL;  Surgeon: Toledo, Benay Pike, MD;  Location: [facility] ENDOSCOPY;  Service: Gastroenterology;  Laterality: N/A;    FAMILY HISTORY: Family History  Problem Relation Age of Onset  . COPD Mother   . Irritable  bowel syndrome Mother   . Glaucoma Father   . Hyperlipidemia Father     SOCIAL HISTORY: Social History   Socioeconomic History  . Marital status: Divorced    Spouse name: Not on file  . Number of children: 3  . Years of education: GED/some college  . Highest education level: Not on file  Occupational History  . Occupation: Disabled  Tobacco Use  . Smoking status: Current Every Day Smoker    Packs/day: 0.50    Years: 20.00    Pack years: 10.00  . Smokeless tobacco: Never Used  Vaping Use  . Vaping Use: Former  Substance and Sexual Activity  . Alcohol use: No  . Drug use: No  . Sexual activity: Not Currently  Other Topics Concern  . Not on file  Social History Narrative   Right-handed.   Drinks four 16oz of [employer] per day.   Lives at home grandmother, uncle and daughter.   Social Determinants of Health   Financial Resource Strain:   . Difficulty of Paying  Living Expenses: Not on file  Food Insecurity:   . Worried About Charity fundraiser in the Last Year: Not on file  . Ran Out of Food in the Last Year: Not on file  Transportation Needs:   . Lack of Transportation (Medical): Not on file  . Lack of Transportation (Non-Medical): Not on file  Physical Activity:   . Days of Exercise per Week: Not on file  . Minutes of Exercise per Session: Not on file  Stress:   . Feeling of Stress : Not on file  Social Connections:   . Frequency of Communication with Friends and Family: Not on file  . Frequency of Social Gatherings with Friends and Family: Not on file  . Attends Religious Services: Not on file  . Active Member of Clubs or Organizations: Not on file  . Attends Archivist Meetings: Not on file  . Marital Status: Not on file  Intimate Partner Violence:   . Fear of Current or Ex-Partner: Not on file  . Emotionally Abused: Not on file  . Physically Abused: Not on file  . Sexually Abused: Not on file   PHYSICAL EXAM  Vitals:   10/14/19  1008  BP: (!) 93/59  Pulse: 86  Weight: 125 lb (56.7 kg)  Height: '5\' 5"'  (1.651 m)   Body mass index is 20.8 kg/m.  Generalized: Well developed, in no acute distress  MMSE - Mini Mental State Exam 05/26/2019  Orientation to time 3  Orientation to Place 5  Registration 3  Attention/ Calculation 3  Recall 3  Language- name 2 objects 2  Language- repeat 1  Language- follow 3 step command 3  Language- read & follow direction 1  Write a sentence 1  Copy design 1  Copy design-comments 10 animals  Total score 26     PHYSICAL EXAMNIATION:  Gen: NAD, conversant, well nourised, well groomed                     Cardiovascular: Regular rate rhythm, no peripheral edema, warm, nontender. Eyes: Conjunctivae clear without exudates or hemorrhage Neck: Supple, no carotid bruits. Pulmonary: Clear to auscultation bilaterally   NEUROLOGICAL EXAM:  MENTAL STATUS: Speech/Cognition: Awake, alert, normal speech, oriented to history taking and casual conversation.  CRANIAL NERVES: CN II: Visual fields are full to confrontation.  Pupils are round equal and briskly reactive to light. CN III, IV, VI: extraocular movement are normal. No ptosis. CN V: Facial sensation is intact to light touch. CN VII: Face is symmetric with normal eye closure and smile. CN VIII: Hearing is normal to casual conversation CN IX, X: Palate elevates symmetrically. Phonation is normal. CN XI: Head turning and shoulder shrug are intact  MOTOR: Muscle bulk and tone are normal. Muscle strength is normal.  REFLEXES: Reflexes are 2  and symmetric at the biceps, triceps, knees and ankles. Plantar responses are flexor.  SENSORY: Intact to light touch, pinprick, positional and vibratory sensation at fingers and toes.  COORDINATION: There is no trunk or limb ataxia.    GAIT/STANCE: Posture is normal. Gait is steady with normal steps, base, arm swing and turning.   DIAGNOSTIC DATA (LABS, IMAGING, TESTING) - I  reviewed patient records, labs, notes, testing and imaging myself where available.  Lab Results  Component Value Date   WBC 7.5 08/15/2019   HGB 12.1 08/15/2019   HCT 35.0 (L) 08/15/2019   MCV 93.3 08/15/2019   PLT 186 08/15/2019  Component Value Date/Time   NA 142 08/16/2019 0645   NA 137 08/19/2012 1509   K 3.5 08/16/2019 0645   K 4.3 08/19/2012 1509   CL 107 08/16/2019 0645   CL 103 08/19/2012 1509   CO2 28 08/16/2019 0645   CO2 30 08/19/2012 1509   GLUCOSE 110 (H) 08/16/2019 0645   GLUCOSE 106 (H) 08/19/2012 1509   BUN 6 08/16/2019 0645   BUN 5 (L) 08/19/2012 1509   CREATININE 0.72 08/16/2019 0645   CREATININE 0.65 08/19/2012 1509   CALCIUM 8.5 (L) 08/16/2019 0645   CALCIUM 8.7 08/19/2012 1509   PROT 5.5 (L) 08/15/2019 0443   PROT 6.8 08/19/2012 1509   ALBUMIN 2.8 (L) 08/15/2019 0443   ALBUMIN 2.8 (L) 08/15/2019 0443   ALBUMIN 3.1 (L) 11/29/2017 1645   ALBUMIN 3.2 (L) 08/19/2012 1509   AST 22 08/15/2019 0443   AST 22 08/19/2012 1509   ALT 22 08/15/2019 0443   ALT 15 08/19/2012 1509   ALKPHOS 61 08/15/2019 0443   ALKPHOS 77 08/19/2012 1509   BILITOT 0.8 08/15/2019 0443   BILITOT 0.8 08/19/2012 1509   GFRNONAA >60 08/16/2019 0645   GFRNONAA >60 08/19/2012 1509   GFRAA >60 08/16/2019 0645   GFRAA >60 08/19/2012 1509   Lab Results  Component Value Date   CHOL 173 04/02/2011   HDL 19 (L) 04/02/2011   LDLCALC 125 (H) 04/02/2011   TRIG 68 08/13/2019   Lab Results  Component Value Date   HGBA1C 6.3 (H) 08/12/2019   Lab Results  Component Value Date   VITAMINB12 >2000 (H) 04/24/2018   Lab Results  Component Value Date   TSH 1.980 04/24/2018      ASSESSMENT AND PLAN 49 y.o. 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  Headache  Has much improved.  Will continue to observe, already on polypharmacy treatment.  Marcial Pacas, M.D. Ph.D.  [facility] [place], [place] [address] Phone: [phone] Fax:      [phone]

## 2020-02-10 DIAGNOSIS — F32A Depression, unspecified: Secondary | ICD-10-CM | POA: Diagnosis present

## 2020-02-10 DIAGNOSIS — R0689 Other abnormalities of breathing: Secondary | ICD-10-CM | POA: Insufficient documentation

## 2020-06-08 DIAGNOSIS — F17200 Nicotine dependence, unspecified, uncomplicated: Secondary | ICD-10-CM | POA: Insufficient documentation

## 2020-06-08 DIAGNOSIS — J431 Panlobular emphysema: Secondary | ICD-10-CM | POA: Insufficient documentation

## 2020-06-08 DIAGNOSIS — J438 Other emphysema: Secondary | ICD-10-CM | POA: Insufficient documentation

## 2020-07-02 DIAGNOSIS — F320 Major depressive disorder, single episode, mild: Secondary | ICD-10-CM | POA: Insufficient documentation

## 2020-07-02 DIAGNOSIS — K2100 Gastro-esophageal reflux disease with esophagitis, without bleeding: Secondary | ICD-10-CM | POA: Diagnosis present

## 2020-07-09 ENCOUNTER — Other Ambulatory Visit: Payer: Self-pay | Admitting: Specialist

## 2020-07-09 DIAGNOSIS — R0600 Dyspnea, unspecified: Secondary | ICD-10-CM

## 2020-07-09 DIAGNOSIS — R0609 Other forms of dyspnea: Secondary | ICD-10-CM

## 2020-08-05 ENCOUNTER — Other Ambulatory Visit
Admission: RE | Admit: 2020-08-05 | Discharge: 2020-08-05 | Disposition: A | Discharge status: Home / Self Care | Payer: Medicaid Other | Source: Ambulatory Visit | Attending: Specialist | Admitting: Specialist

## 2020-08-05 DIAGNOSIS — R042 Hemoptysis: Secondary | ICD-10-CM | POA: Insufficient documentation

## 2020-08-05 LAB — D-DIMER, QUANTITATIVE: D-Dimer, Quant: 1.38 ug/mL-FEU — ABNORMAL HIGH (ref 0.00–0.50)

## 2020-08-06 ENCOUNTER — Other Ambulatory Visit (HOSPITAL_COMMUNITY): Payer: Self-pay | Admitting: Specialist

## 2020-08-06 ENCOUNTER — Other Ambulatory Visit: Payer: Self-pay | Admitting: Specialist

## 2020-08-06 DIAGNOSIS — R042 Hemoptysis: Secondary | ICD-10-CM

## 2020-08-06 DIAGNOSIS — J449 Chronic obstructive pulmonary disease, unspecified: Secondary | ICD-10-CM

## 2020-08-13 ENCOUNTER — Ambulatory Visit
Admission: RE | Admit: 2020-08-13 | Discharge: 2020-08-13 | Disposition: A | Discharge status: Home / Self Care | Payer: Medicaid Other | Source: Ambulatory Visit | Attending: Specialist | Admitting: Specialist

## 2020-08-13 ENCOUNTER — Other Ambulatory Visit: Payer: Self-pay

## 2020-08-13 DIAGNOSIS — J449 Chronic obstructive pulmonary disease, unspecified: Secondary | ICD-10-CM

## 2020-08-13 DIAGNOSIS — R042 Hemoptysis: Secondary | ICD-10-CM | POA: Diagnosis present

## 2020-08-13 LAB — POCT I-STAT CREATININE: Creatinine, Ser: 0.8 mg/dL (ref 0.44–1.00)

## 2020-08-13 MED ORDER — IOHEXOL 350 MG/ML SOLN
75.0000 mL | Freq: Once | INTRAVENOUS | Status: AC | PRN
  Administered 2020-08-13: 75 mL via INTRAVENOUS

## 2020-11-01 NOTE — Telephone Encounter (Signed)
Spoke with patient re: referral   After reading note and dx discovered referral entered in error and was a referral meant for pulmonary but faxed to cardiology .     Patient states husband will call back to discuss scheduling .  When calling back he will need to schedule with pulmonology .

## 2020-11-03 DIAGNOSIS — Z8711 Personal history of peptic ulcer disease: Secondary | ICD-10-CM | POA: Insufficient documentation

## 2020-11-03 DIAGNOSIS — R1013 Epigastric pain: Secondary | ICD-10-CM | POA: Insufficient documentation

## 2020-11-04 DIAGNOSIS — K315 Obstruction of duodenum: Secondary | ICD-10-CM | POA: Insufficient documentation

## 2020-11-11 ENCOUNTER — Telehealth: Payer: Self-pay

## 2020-11-11 NOTE — Telephone Encounter (Signed)
Received another message from Dr. Autumn Messing is no longer a schedule change. Appt does not need to be moved. Will close encounter.

## 2020-11-11 NOTE — Telephone Encounter (Signed)
Received epic secure message from Dr. [name] to move patient's schedule visit to an earlier time slot due to a schedule change.  ATC patient on mobile number-unable to leave vm due to mailbox being full. ATC home number on file-received recording that caller uses call blocker and my call could not be accepted.   Will call back.

## 2020-11-18 ENCOUNTER — Encounter: Payer: Self-pay | Admitting: Internal Medicine

## 2020-11-18 ENCOUNTER — Ambulatory Visit
Admission: RE | Admit: 2020-11-18 | Discharge: 2020-11-18 | Disposition: A | Discharge status: Home / Self Care | Payer: Medicaid Other | Attending: Internal Medicine | Admitting: Internal Medicine

## 2020-11-18 ENCOUNTER — Ambulatory Visit (INDEPENDENT_AMBULATORY_CARE_PROVIDER_SITE_OTHER): Payer: Medicaid Other | Admitting: Internal Medicine

## 2020-11-18 ENCOUNTER — Ambulatory Visit
Admission: RE | Admit: 2020-11-18 | Discharge: 2020-11-18 | Disposition: A | Discharge status: Home / Self Care | Payer: Medicaid Other | Source: Ambulatory Visit | Attending: Internal Medicine | Admitting: Internal Medicine

## 2020-11-18 ENCOUNTER — Other Ambulatory Visit: Payer: Self-pay

## 2020-11-18 DIAGNOSIS — J449 Chronic obstructive pulmonary disease, unspecified: Secondary | ICD-10-CM

## 2020-11-18 DIAGNOSIS — J9611 Chronic respiratory failure with hypoxia: Secondary | ICD-10-CM | POA: Diagnosis not present

## 2020-11-18 DIAGNOSIS — F17210 Nicotine dependence, cigarettes, uncomplicated: Secondary | ICD-10-CM | POA: Diagnosis not present
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
## 2020-11-18 NOTE — Patient Instructions (Addendum)
The key is to stop smoking completely before smoking completely stops you!  Make sure you check your oxygen saturation  AT  your highest level of activity (not after you stop)   to be sure it stays over 90% and adjust  02 flow upward to maintain this level if needed but remember to turn it back to previous settings when you stop (to conserve your supply).   Please remember to go to the  x-ray department  for your tests - we will call you with the results when they are available    Please schedule a follow up office visit in 4 weeks, sooner if needed  with all medications /inhalers/ solutions in hand so we can verify exactly what you are taking. This includes all medications from all doctors and over the counters

## 2020-11-19 ENCOUNTER — Encounter: Payer: Self-pay | Admitting: Internal Medicine

## 2020-11-19 DIAGNOSIS — J9611 Chronic respiratory failure with hypoxia: Secondary | ICD-10-CM | POA: Insufficient documentation

## 2020-11-19 DIAGNOSIS — F17210 Nicotine dependence, cigarettes, uncomplicated: Secondary | ICD-10-CM | POA: Insufficient documentation

## 2020-11-19 NOTE — Assessment & Plan Note (Signed)
02 dep since Feb 2022 d/c from [facility] p removal of rods from rib fx -  sats 92% at rest 11/18/2020 and actively smoking   Probably secondary to emphysema and mucus plugging / v/q mismatch both directly related to smoking so rec no smoking and titrate 02 to keep sats > 90% at all times

## 2020-11-19 NOTE — Assessment & Plan Note (Addendum)
Counseled re importance of smoking cessation but did not meet time criteria for separate billing    rec Regroup in 4 weeks with all meds in hand using a trust but verify approach to confirm accurate Medication  Reconciliation The principal here is that until we are certain that the  patients are doing what we've asked, it makes no sense to ask them to do more.         Each maintenance medication was reviewed in detail including emphasizing most importantly the difference between maintenance and prns and under what circumstances the prns are to be triggered using an action plan format where appropriate.  Total time for H and P, chart review, counseling, reviewing 02  device(s) and generating customized AVS unique to this office visit / same day charting  > 45 min

## 2020-11-19 NOTE — Assessment & Plan Note (Addendum)
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

## 2020-12-07 ENCOUNTER — Ambulatory Visit: Payer: Medicaid Other | Admitting: Internal Medicine
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
## 2021-01-10 ENCOUNTER — Emergency Department
Admission: EM | Admit: 2021-01-10 | Discharge: 2021-01-10 | Discharge status: Against Medical Advice | Payer: Medicaid Other | Attending: Emergency Medicine | Admitting: Emergency Medicine

## 2021-01-10 ENCOUNTER — Other Ambulatory Visit: Payer: Self-pay

## 2021-01-10 DIAGNOSIS — M545 Low back pain, unspecified: Secondary | ICD-10-CM | POA: Insufficient documentation

## 2021-01-10 DIAGNOSIS — Z5321 Procedure and treatment not carried out due to patient leaving prior to being seen by health care provider: Secondary | ICD-10-CM | POA: Insufficient documentation

## 2021-01-10 NOTE — ED Triage Notes (Addendum)
Pt comes with back pain. Pt states lower back pain that started few days ago. Pt denies any known injuries. Pt states the pain is just too bad.

## 2021-02-03 DIAGNOSIS — Z6825 Body mass index (BMI) 25.0-25.9, adult: Secondary | ICD-10-CM | POA: Insufficient documentation

## 2021-02-06 DIAGNOSIS — R3129 Other microscopic hematuria: Secondary | ICD-10-CM | POA: Insufficient documentation

## 2021-03-29 ENCOUNTER — Other Ambulatory Visit: Payer: Self-pay

## 2021-03-29 ENCOUNTER — Encounter: Payer: Self-pay | Admitting: Internal Medicine

## 2021-03-29 ENCOUNTER — Ambulatory Visit (INDEPENDENT_AMBULATORY_CARE_PROVIDER_SITE_OTHER): Payer: Medicaid Other | Admitting: Internal Medicine

## 2021-03-29 DIAGNOSIS — J449 Chronic obstructive pulmonary disease, unspecified: Secondary | ICD-10-CM

## 2021-03-29 DIAGNOSIS — F17210 Nicotine dependence, cigarettes, uncomplicated: Secondary | ICD-10-CM | POA: Diagnosis not present

## 2021-03-29 DIAGNOSIS — J9611 Chronic respiratory failure with hypoxia: Secondary | ICD-10-CM | POA: Diagnosis not present

## 2021-03-29 LAB — CBC WITH DIFFERENTIAL/PLATELET
Basophils Absolute: 0 10*3/uL (ref 0.0–0.1)
Basophils Relative: 0.5 % (ref 0.0–3.0)
Eosinophils Absolute: 0.1 10*3/uL (ref 0.0–0.7)
Eosinophils Relative: 1.3 % (ref 0.0–5.0)
HCT: 39.2 % (ref 36.0–46.0)
Hemoglobin: 12.5 g/dL (ref 12.0–15.0)
Lymphocytes Relative: 32.6 % (ref 12.0–46.0)
Lymphs Abs: 2.1 10*3/uL (ref 0.7–4.0)
MCHC: 31.8 g/dL (ref 30.0–36.0)
MCV: 91.2 fl (ref 78.0–100.0)
Monocytes Absolute: 0.5 10*3/uL (ref 0.1–1.0)
Monocytes Relative: 7.1 % (ref 3.0–12.0)
Neutro Abs: 3.7 10*3/uL (ref 1.4–7.7)
Neutrophils Relative %: 58.5 % (ref 43.0–77.0)
Platelets: 212 10*3/uL (ref 150.0–400.0)
RBC: 4.3 Mil/uL (ref 3.87–5.11)
RDW: 18.3 % — ABNORMAL HIGH (ref 11.5–15.5)
WBC: 6.4 10*3/uL (ref 4.0–10.5)

## 2021-03-29 LAB — BRAIN NATRIURETIC PEPTIDE: Pro B Natriuretic peptide (BNP): 134 pg/mL — ABNORMAL HIGH (ref 0.0–100.0)

## 2021-03-29 LAB — TSH: TSH: 1.15 u[IU]/mL (ref 0.35–5.50)

## 2021-03-29 NOTE — Patient Instructions (Addendum)
Plan A = Automatic = Always=    Stiolto 2 puffs 1st  each am  ? ?Plan B = Backup (to supplement plan A, not to replace it) ?Only use your albuterol inhaler as a rescue medication to be used if you can't catch your breath by resting or doing a relaxed purse lip breathing pattern.  ?- The less you use it, the better it will work when you need it. ?- Ok to use the inhaler up to 2 puffs  every 4 hours if you must but call for appointment if use goes up over your usual need ?- Don't leave home without it !!  (think of it like the spare tire for your car)  ? ?  ?Ok to try albuterol 15 min before an activity (on alternating days)  that you know would usually make you short of breath and see if it makes any difference and if makes none then don't take albuterol after activity unless you can't catch your breath as this means it's the resting that helps, not the albuterol. ? ?Make sure you check your oxygen saturation  AT  your highest level of activity (not after you stop)   to be sure it stays over 90% and adjust  02 flow upward to maintain this level if needed but remember to turn it back to previous settings when you stop (to conserve your supply).  ? ?Please remember to go to the lab department   for your tests - we will call you with the results when they are available. ?    ? ?Please schedule a follow up visit in 3 months but call sooner if needed  ?    ? ?  ?
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
## 2021-03-30 ENCOUNTER — Encounter: Payer: Self-pay | Admitting: Internal Medicine

## 2021-03-30 LAB — IGE: IgE (Immunoglobulin E), Serum: 10 kU/L (ref <=114)

## 2021-03-30 MED ORDER — STIOLTO RESPIMAT 2.5-2.5 MCG/ACT IN AERS: 2.0000 | INHALATION_SPRAY | Freq: Every day | RESPIRATORY_TRACT | 0 refills | Status: DC

## 2021-03-30 NOTE — Assessment & Plan Note (Signed)
Active smoker ?- Echo 10/08/20 mild MR, no PH  ?- PFTs  07/08/20 FEV1  2.28(112%) Ratio 64 and "severe drop in dlco"  > no better clinically with saba or spiriva dpi ?- Labs ordered 03/29/2021  :  allergy profile   alpha one AT phenotype   ?- 03/29/2021  After extensive coaching inhaler device,  effectiveness =    75% with smi > stiolto trial  ?- 03/29/2021   Walked on RA  x  2  lap(s) =  approx 500  ft  @ moderate pace, stopped due to sob with lowest 02 sats 92% ? ?She did not follow any of the recs I made from the prev ov so tried to simplify for her and husband using an engine analogy.    ? ?Pt is Group B in terms of symptom/risk and laba/lama therefore appropriate rx at this point >>>  stiolto trial to see if can improve ex tol "like high octane fuel can help an engine with lots of miles on it" (in this case because of letting dirt -cigs- get in the air intake)   ? ? ? ?  ?

## 2021-03-30 NOTE — Assessment & Plan Note (Signed)
4-5 min discussion re active cigarette smoking in addition to office E&M ? ?Ask about tobacco use:   Ongoing  ?Advise quitting   I took an extended  opportunity with this patient to outline the consequences of continued cigarette use  in airway disorders based on all the data we have from the multiple national lung health studies (perfomed over decades at millions of dollars in cost)  indicating that smoking cessation, not choice of inhalers or physicians, is the most important aspect of her care.   ?Assess willingness:  Not committed at this point ?  ?Arrange follow up:   Follow up per Primary Care planned  ?For smoking cessation classes call [phone]  ?  ?  ? ?

## 2021-03-30 NOTE — Assessment & Plan Note (Addendum)
02 dep since Feb 2022 d/c from [facility] p removal of rods from rib fx ?-  sats 92% at rest 11/18/2020 and actively smoking  ? ?No desaturation with her level of activity described but needs to monitor at peak levels and if < 90% either walk slower or accept portable 02 ? ?Also need to consider pulmonary rehab  ? ?Each maintenance medication was reviewed in detail including emphasizing most importantly the difference between maintenance and prns and under what circumstances the prns are to be triggered using an action plan format where appropriate. ? ?Total time for H and P, chart review, counseling, reviewing smi/02/hfa device(s) , directly observing portions of ambulatory 02 saturation study/ and generating customized AVS unique to this office visit / same day charting = 38min  ?     ?  ?      ?

## 2021-03-30 NOTE — Addendum Note (Signed)
Addended by: Wyvonne Lenz on: 03/30/2021 11:59 AM ? ? Modules accepted: Orders ? ?

## 2021-03-31 LAB — ALPHA-1-ANTITRYPSIN PHENOTYP: A-1 Antitrypsin: 179 mg/dL (ref 101–187)

## 2021-06-12 ENCOUNTER — Emergency Department
Admission: EM | Admit: 2021-06-12 | Discharge: 2021-06-12 | Disposition: A | Discharge status: Home / Self Care | Payer: Medicaid Other | Attending: Emergency Medicine | Admitting: Emergency Medicine

## 2021-06-12 ENCOUNTER — Other Ambulatory Visit: Payer: Self-pay

## 2021-06-12 ENCOUNTER — Emergency Department: Payer: Medicaid Other

## 2021-06-12 ENCOUNTER — Encounter: Payer: Self-pay | Admitting: Emergency Medicine

## 2021-06-12 DIAGNOSIS — J449 Chronic obstructive pulmonary disease, unspecified: Secondary | ICD-10-CM | POA: Insufficient documentation

## 2021-06-12 DIAGNOSIS — K2900 Acute gastritis without bleeding: Secondary | ICD-10-CM | POA: Diagnosis not present

## 2021-06-12 DIAGNOSIS — B9681 Helicobacter pylori [H. pylori] as the cause of diseases classified elsewhere: Secondary | ICD-10-CM | POA: Insufficient documentation

## 2021-06-12 DIAGNOSIS — R638 Other symptoms and signs concerning food and fluid intake: Secondary | ICD-10-CM | POA: Diagnosis not present

## 2021-06-12 DIAGNOSIS — R1010 Upper abdominal pain, unspecified: Secondary | ICD-10-CM | POA: Diagnosis present

## 2021-06-12 DIAGNOSIS — A048 Other specified bacterial intestinal infections: Secondary | ICD-10-CM

## 2021-06-12 LAB — COMPREHENSIVE METABOLIC PANEL
ALT: 10 U/L (ref 0–44)
AST: 16 U/L (ref 15–41)
Albumin: 3.4 g/dL — ABNORMAL LOW (ref 3.5–5.0)
Alkaline Phosphatase: 88 U/L (ref 38–126)
Anion gap: 8 (ref 5–15)
BUN: 15 mg/dL (ref 6–20)
CO2: 29 mmol/L (ref 22–32)
Calcium: 8.9 mg/dL (ref 8.9–10.3)
Chloride: 100 mmol/L (ref 98–111)
Creatinine, Ser: 0.72 mg/dL (ref 0.44–1.00)
GFR, Estimated: >60 mL/min (ref >60)
Glucose, Bld: 91 mg/dL (ref 70–99)
Potassium: 4.5 mmol/L (ref 3.5–5.1)
Sodium: 137 mmol/L (ref 135–145)
Total Bilirubin: 0.3 mg/dL (ref 0.3–1.2)
Total Protein: 7.2 g/dL (ref 6.5–8.1)

## 2021-06-12 LAB — CBC
HCT: 46.3 % — ABNORMAL HIGH (ref 36.0–46.0)
Hemoglobin: 14.1 g/dL (ref 12.0–15.0)
MCH: 29.6 pg (ref 26.0–34.0)
MCHC: 30.5 g/dL (ref 30.0–36.0)
MCV: 97.3 fL (ref 80.0–100.0)
Platelets: 320 10*3/uL (ref 150–400)
RBC: 4.76 MIL/uL (ref 3.87–5.11)
RDW: 14.7 % (ref 11.5–15.5)
WBC: 8 10*3/uL (ref 4.0–10.5)
nRBC: 0 % (ref 0.0–0.2)

## 2021-06-12 LAB — TROPONIN I (HIGH SENSITIVITY): Troponin I (High Sensitivity): 3 ng/L (ref <18)

## 2021-06-12 LAB — LIPASE, BLOOD: Lipase: 36 U/L (ref 11–51)

## 2021-06-12 MED ORDER — PANTOPRAZOLE SODIUM 40 MG IV SOLR
40.0000 mg | Freq: Once | INTRAVENOUS | Status: AC
  Administered 2021-06-12: 40 mg via INTRAVENOUS
  Filled 2021-06-12: qty 10

## 2021-06-12 MED ORDER — SODIUM CHLORIDE 0.9 % IV BOLUS
1000.0000 mL | Freq: Once | INTRAVENOUS | Status: AC
  Administered 2021-06-12: 1000 mL via INTRAVENOUS

## 2021-06-12 MED ORDER — BISMUTH SUBSALICYLATE 262 MG PO CHEW
262.0000 mg | CHEWABLE_TABLET | Freq: Three times a day (TID) | ORAL | 0 refills | Status: AC
Start: 2021-06-12 — End: ?

## 2021-06-12 MED ORDER — DOXYCYCLINE MONOHYDRATE 100 MG PO TABS: 100.0000 mg | ORAL_TABLET | Freq: Two times a day (BID) | ORAL | 0 refills | Status: DC

## 2021-06-12 MED ORDER — LIDOCAINE VISCOUS HCL 2 % MT SOLN
15.0000 mL | Freq: Once | OROMUCOSAL | Status: AC
  Administered 2021-06-12: 15 mL via ORAL
  Filled 2021-06-12: qty 15

## 2021-06-12 MED ORDER — ALUM & MAG HYDROXIDE-SIMETH 200-200-20 MG/5ML PO SUSP
30.0000 mL | Freq: Once | ORAL | Status: AC
  Administered 2021-06-12: 30 mL via ORAL
  Filled 2021-06-12: qty 30

## 2021-06-12 MED ORDER — SUCRALFATE 1 G PO TABS
1.0000 g | ORAL_TABLET | Freq: Once | ORAL | Status: AC
  Administered 2021-06-12: 1 g via ORAL
  Filled 2021-06-12: qty 1

## 2021-06-12 MED ORDER — METRONIDAZOLE 500 MG PO TABS: 500.0000 mg | ORAL_TABLET | Freq: Three times a day (TID) | ORAL | 0 refills | Status: AC

## 2021-06-12 NOTE — ED Triage Notes (Signed)
Pt via POV from home. Pt c/o epigastric pain that started getting progressively worse, pt states it been going on for month. Pt endorses nausea. Denies any rectal bleeding. Pt is A&Ox4 and NAD Pt states that has a hx of ulcer.

## 2021-06-12 NOTE — ED Provider Notes (Signed)
[facility] Provider Note    Event Date/Time   First MD Initiated Contact with Patient 06/12/21 1432     (approximate)   History   Abdominal Pain   HPI  Debbie Gay is a 51 y.o. female with history of H. pylori who comes in with concerns for recurrent ulcer.  Patient reports having H. pylori previously and is getting the medications for and getting better however she has intermittent episodes where she has recurrent symptoms of ulcers.  She states that she would not of come here but her insurance was having issues and she could not follow-up with her primary care doctor which is why she came to the emergency room.  She does report a little bit of decreased p.o. intake secondary to the discomfort in her upper abdomen.  She denies any lower abdominal tenderness, chest pain, shortness of breath or other concerns.  She reports that this episodes been going on for about a month but progressively getting worse.  Physical Exam   Triage Vital Signs: ED Triage Vitals  Enc Vitals Group     BP 06/12/21 1401 109/85     Pulse Rate 06/12/21 1401 95     Resp 06/12/21 1401 18     Temp 06/12/21 1401 98.3 F (36.8 C)     Temp Source 06/12/21 1401 Oral     SpO2 06/12/21 1401 93 %     Weight 06/12/21 1359 142 lb (64.4 kg)     Height 06/12/21 1359 5\' 6"  (1.676 m)     Head Circumference --      Peak Flow --      Pain Score 06/12/21 1359 7     Pain Loc --      Pain Edu? --      Excl. in [place]? --     Most recent vital signs: Vitals:   06/12/21 1401  BP: 109/85  Pulse: 95  Resp: 18  Temp: 98.3 F (36.8 C)  SpO2: 93%     General: Awake, no distress.  CV:  Good peripheral perfusion.  Resp:  Normal effort.  Abd:  No distention.  Soft and nontender without any rebound or guarding Other:     ED Results / Procedures / Treatments   Labs (all labs ordered are listed, but only abnormal results are displayed) Labs Reviewed  LIPASE, BLOOD  COMPREHENSIVE  METABOLIC PANEL  CBC  URINALYSIS, ROUTINE W REFLEX MICROSCOPIC  POC URINE PREG, ED     EKG  My interpretation of EKG:  Normal sinus rhythm 92 without any ST elevation or T wave inversions, normal intervals  RADIOLOGY I reviewed and interpreted the x-ray personally no evidence of free air patient does have changes of COPD  PROCEDURES:  Critical Care performed: No  .1-3 Lead EKG Interpretation  Performed by: Vanessa Nicasio, MD Authorized by: Vanessa Antimony, MD     Interpretation: normal     ECG rate:  80   ECG rate assessment: normal     Rhythm: sinus rhythm     Ectopy: none     Conduction: normal      MEDICATIONS ORDERED IN ED: Medications  alum & mag hydroxide-simeth (MAALOX/MYLANTA) 200-200-20 MG/5ML suspension 30 mL (30 mLs Oral Given 06/12/21 1445)    And  lidocaine (XYLOCAINE) 2 % viscous mouth solution 15 mL (15 mLs Oral Given 06/12/21 1445)  pantoprazole (PROTONIX) injection 40 mg (40 mg Intravenous Given 06/12/21 1443)  sucralfate (CARAFATE) tablet 1 g (1  g Oral Given 06/12/21 1444)  sodium chloride 0.9 % bolus 1,000 mL (1,000 mLs Intravenous New Bag/Given 06/12/21 1442)     IMPRESSION / MDM / ASSESSMENT AND PLAN / ED COURSE  I reviewed the triage vital signs and the nursing notes.   Patient's presentation is most consistent with acute presentation with potential threat to life or bodily function.   Concern for ulcer secondary to H. pylori versus smoking history.  She denies any NSAID use.  Labs ordered evaluate for ACS, pancreatitis, cholecystitis.  X-ray ordered evaluate for any free air but her abdomen is pretty reassuring she reports this feels similar to previous.  She is slightly low oxygen levels but she reports that she has COPD and she supposed to be wearing oxygen as needed and that she just does not have it on but denies any new respiratory symptoms.  I did review patient's last CT done on 02/07/2021 that was negative and they recommended that she just  follow-up with GI but she reports that she cannot get in until September.   CBC is normal without any white count elevation x-ray was concerning for atelectasis versus possible infiltrate but patient is denying any shortness of breath different from her baseline she is got known COPD and I doubt any infectious process at this time.  Lipase is normal.  CMP is normal.  I discussed with Dr. Haig Prophet who is okay with proceeding with H. pylori treatment given she had H. pylori previously.  They will try to get her close follow-up  3:26 PM patient and she feels comfortable with the plan and will follow-up outpatient with GI.  She understands not to drink on the flat   The patient is on the cardiac monitor to evaluate for evidence of arrhythmia and/or significant heart rate changes.      FINAL CLINICAL IMPRESSION(S) / ED DIAGNOSES   Final diagnoses:  Acute gastritis without hemorrhage, unspecified gastritis type  H. pylori infection     Rx / DC Orders   ED Discharge Orders          Ordered    bismuth subsalicylate (PEPTO BISMOL) 262 MG chewable tablet  3 times daily before meals & bedtime        06/12/21 1526    metroNIDAZOLE (FLAGYL) 500 MG tablet  3 times daily        06/12/21 1526    doxycycline (ADOXA) 100 MG tablet  2 times daily        06/12/21 1526             Note:  This document was prepared using Dragon voice recognition software and may include unintentional dictation errors.   Vanessa Lott, MD 06/12/21 [phone]

## 2021-06-12 NOTE — Discharge Instructions (Addendum)
We are starting you on some medications to help with your H. pylori.  Do not drink alcohol with this.  Continue your pantoprazole that you take at home with this medication.  Please follow-up with GI.  Avoid any ibuprofen and you can take Tylenol 1 g every 8 hours to help with pain

## 2021-06-12 NOTE — ED Notes (Signed)
Pt visitor asking if patient can eat

## 2021-06-12 NOTE — ED Notes (Signed)
Pt provided ginger ale and sandwich box for PO challenge. Pt rudely refused the food stating "I want to go home now and get chicken. I'll tolerate the food". Pt also insisted on taking her IV out now despite pt education. MD notified.

## 2021-06-12 NOTE — ED Provider Notes (Signed)
-----------------------------------------   4:06 PM on 06/12/2021 -----------------------------------------  Patient care assumed from Dr. Jari Pigg.  Patient's labs are largely within normal limits.  Troponin negative.  Patient was able to eat without any pain, states she feels well and is ready to go home.  Has not provided urine sample but denies any urinary symptoms.  Dr. Jari Pigg has written the patient for H. pylori treatment per GI.  Patient will follow-up with GI.   Harvest Dark, MD 06/12/21 [phone]

## 2021-06-13 ENCOUNTER — Telehealth: Payer: Self-pay | Admitting: Emergency Medicine

## 2021-06-13 MED ORDER — DOXYCYCLINE MONOHYDRATE 100 MG PO CAPS: 100.0000 mg | ORAL_CAPSULE | Freq: Two times a day (BID) | ORAL | 0 refills | Status: AC

## 2021-06-13 NOTE — Telephone Encounter (Signed)
Patient called stating that the doxycycline was not covered.  I changed the formulation and hopefully this will be covered given it appears to on my end.

## 2021-07-28 ENCOUNTER — Ambulatory Visit: Payer: Medicaid Other | Admitting: Internal Medicine
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
## 2021-08-26 ENCOUNTER — Ambulatory Visit (INDEPENDENT_AMBULATORY_CARE_PROVIDER_SITE_OTHER): Payer: Medicaid Other | Admitting: Internal Medicine

## 2021-08-26 ENCOUNTER — Encounter: Payer: Self-pay | Admitting: Internal Medicine

## 2021-08-26 VITALS — BP 100/80 | HR 87 | Temp 98.0°F | Ht 66.0 in | Wt 144.6 lb

## 2021-08-26 DIAGNOSIS — F17210 Nicotine dependence, cigarettes, uncomplicated: Secondary | ICD-10-CM

## 2021-08-26 DIAGNOSIS — J449 Chronic obstructive pulmonary disease, unspecified: Secondary | ICD-10-CM | POA: Diagnosis not present

## 2021-08-26 DIAGNOSIS — J9611 Chronic respiratory failure with hypoxia: Secondary | ICD-10-CM

## 2021-08-26 NOTE — Patient Instructions (Addendum)
Make sure you check your oxygen saturation  AT  your highest level of activity (not after you stop)   to be sure it stays over 90% and adjust  02 flow upward to maintain this level if needed but remember to turn it back to previous settings when you stop (to conserve your supply).    We will order CTa chest next available and I will call you with results   The key is to stop smoking completely before smoking completely stops you!     Please schedule a follow up office visit in 6 weeks, call sooner if needed with pfts on return
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
## 2021-08-27 ENCOUNTER — Encounter: Payer: Self-pay | Admitting: Internal Medicine

## 2021-08-27 NOTE — Assessment & Plan Note (Signed)
Counseled re importance of smoking cessation but did not meet time criteria for separate billing    Advised re dangers of smoking and hypoxemia plus the danger of lighting a cigarette around 02   Each maintenance medication was reviewed in detail including emphasizing most importantly the difference between maintenance and prns and under what circumstances the prns are to be triggered using an action plan format where appropriate.  Total time for H and P, chart review, counseling,  directly observing portions of ambulatory 02 saturation study/ and generating customized AVS unique to this office visit / same day charting > 30 min for multiple  Refractory chest  respiratory  symptoms of uncertain etiology

## 2021-08-27 NOTE — Assessment & Plan Note (Addendum)
Active smoker - Echo 10/08/20 mild MR, no PH  - PFTs  07/08/20 FEV1  2.28(112%) Ratio 64 and "severe drop in dlco"  > no better clinically with saba or spiriva dpi - Labs ordered 03/29/2021  :  allergy screen  Eos 0.1 / IgE 10    alpha one AT phenotype  MM level 179  - 03/29/2021  After extensive coaching inhaler device,  effectiveness =    75% with smi > stiolto trial > no better - 03/29/2021   Walked on RA  x  2  lap(s) =  approx 500  ft  @ moderate pace, stopped due to sob with lowest 02 sats 92%  Pt is Group B in terms of symptom/risk and laba/lama therefore appropriate rx at this point >>>  stiolto best choice but says it didn't work, not clear when that happened relative to the the desaturation she is not experiencing which obviously may not have been stiolto responsive anyway  - she's convinced though it didn't work so will leave off and try to explore why she's not desaturating so much (see separate a/p)
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
## 2021-09-01 ENCOUNTER — Other Ambulatory Visit
Admission: RE | Admit: 2021-09-01 | Discharge: 2021-09-01 | Disposition: A | Discharge status: Home / Self Care | Payer: Medicaid Other | Attending: Internal Medicine | Admitting: Internal Medicine

## 2021-09-01 ENCOUNTER — Telehealth: Payer: Self-pay

## 2021-09-01 DIAGNOSIS — E876 Hypokalemia: Secondary | ICD-10-CM

## 2021-09-01 DIAGNOSIS — J9611 Chronic respiratory failure with hypoxia: Secondary | ICD-10-CM | POA: Diagnosis present

## 2021-09-01 DIAGNOSIS — J449 Chronic obstructive pulmonary disease, unspecified: Secondary | ICD-10-CM | POA: Diagnosis present

## 2021-09-01 LAB — BASIC METABOLIC PANEL
Anion gap: 10 (ref 5–15)
BUN: 11 mg/dL (ref 6–20)
CO2: 31 mmol/L (ref 22–32)
Calcium: 8.9 mg/dL (ref 8.9–10.3)
Chloride: 100 mmol/L (ref 98–111)
Creatinine, Ser: 0.64 mg/dL (ref 0.44–1.00)
GFR, Estimated: >60 mL/min (ref >60)
Glucose, Bld: 90 mg/dL (ref 70–99)
Potassium: 2.7 mmol/L — CL (ref 3.5–5.1)
Sodium: 141 mmol/L (ref 135–145)

## 2021-09-01 MED ORDER — POTASSIUM CHLORIDE CRYS ER 20 MEQ PO TBCR: 40.0000 meq | EXTENDED_RELEASE_TABLET | Freq: Two times a day (BID) | ORAL | 0 refills | Status: AC

## 2021-09-01 NOTE — Addendum Note (Signed)
Addended by: Christy Gentles on: 09/01/2021 01:25 PM   Modules accepted: Orders

## 2021-09-01 NOTE — Telephone Encounter (Signed)
Received call report from [facility] lab. Potassium 2.7.   Dr. [name], please advise. thanks

## 2021-09-01 NOTE — Telephone Encounter (Signed)
Nyoka Cowden, MD  Rosaland Lao, CMA Call patient :  Debbie Gay is ok x for low K likely not eating enough or losing it for example by use of laxatives   Rec  D/c all laxatives (if applicable)  Kdur 40 meq bid x 5 days and repeat bmet   Patient is aware of results/recommendations and voiced her understanding.  Kdur has been sent to preferred pharmacy.  Bmet ordered.  Nothing further needed.

## 2021-09-02 ENCOUNTER — Ambulatory Visit
Admission: RE | Admit: 2021-09-02 | Discharge: 2021-09-02 | Disposition: A | Discharge status: Home / Self Care | Payer: Medicaid Other | Source: Ambulatory Visit | Attending: Internal Medicine | Admitting: Internal Medicine

## 2021-09-02 DIAGNOSIS — J9611 Chronic respiratory failure with hypoxia: Secondary | ICD-10-CM | POA: Diagnosis not present

## 2021-09-02 MED ORDER — IOHEXOL 350 MG/ML SOLN
75.0000 mL | Freq: Once | INTRAVENOUS | Status: AC | PRN
Start: 2021-09-02 — End: 2021-09-02
  Administered 2021-09-02: 75 mL via INTRAVENOUS

## 2021-09-09 ENCOUNTER — Other Ambulatory Visit: Payer: Self-pay | Admitting: Internal Medicine

## 2021-09-09 DIAGNOSIS — J9611 Chronic respiratory failure with hypoxia: Secondary | ICD-10-CM

## 2021-09-09 NOTE — Progress Notes (Signed)
Spoke with pt and notified of results per Dr. [name]. Pt verbalized understanding and denied any questions. She was agreeable to ECHO with bubble and I have ordered this.

## 2021-09-26 ENCOUNTER — Ambulatory Visit (HOSPITAL_COMMUNITY): Payer: Medicaid Other | Attending: Internal Medicine

## 2021-09-26 DIAGNOSIS — I361 Nonrheumatic tricuspid (valve) insufficiency: Secondary | ICD-10-CM

## 2021-09-26 DIAGNOSIS — J9611 Chronic respiratory failure with hypoxia: Secondary | ICD-10-CM | POA: Diagnosis not present

## 2021-09-26 LAB — ECHOCARDIOGRAM COMPLETE BUBBLE STUDY
Area-P 1/2: 5.02 cm2
S' Lateral: 3.8 cm

## 2021-09-28 ENCOUNTER — Telehealth: Payer: Self-pay | Admitting: *Deleted

## 2021-09-28 NOTE — Telephone Encounter (Signed)
Patient is aware of results and voiced her understanding.  Nothing further needed.   

## 2021-10-20 ENCOUNTER — Encounter: Payer: Self-pay | Admitting: Internal Medicine

## 2021-10-20 ENCOUNTER — Ambulatory Visit: Payer: Medicaid Other | Admitting: Internal Medicine

## 2021-10-20 DIAGNOSIS — J449 Chronic obstructive pulmonary disease, unspecified: Secondary | ICD-10-CM

## 2021-10-20 DIAGNOSIS — J9611 Chronic respiratory failure with hypoxia: Secondary | ICD-10-CM | POA: Diagnosis not present

## 2021-10-20 DIAGNOSIS — F17210 Nicotine dependence, cigarettes, uncomplicated: Secondary | ICD-10-CM | POA: Diagnosis not present

## 2021-10-20 MED ORDER — AZITHROMYCIN 250 MG PO TABS: ORAL_TABLET | ORAL | 0 refills | Status: DC
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
## 2021-10-20 NOTE — Addendum Note (Signed)
Addended by: Vanessa Barbara on: 10/20/2021 03:33 PM   Modules accepted: Orders

## 2021-10-20 NOTE — Assessment & Plan Note (Signed)
Active smoker - Echo 10/08/20 mild MR, no PH  - PFTs  07/08/20 FEV1  2.28(112%) Ratio 64 and "severe drop in dlco"  > no better clinically with saba or spiriva dpi - Labs ordered 03/29/2021  :  allergy screen  Eos 0.1 / IgE 10    alpha one AT phenotype  MM level 179  - 03/29/2021  After extensive coaching inhaler device,  effectiveness =    75% with smi > stiolto trial  - 03/29/2021   Walked on RA  x  2  lap(s) =  approx 500  ft  @ moderate pace, stopped due to sob with lowest 02 sats 92%  - 08/26/2021 severe desats walking > see chronic resp failure   No cahnge on vs off lama/laba so just treating with 02 with ex now -  (see separate a/p)

## 2021-10-20 NOTE — Patient Instructions (Signed)
Make sure you check your oxygen saturation  AT  your highest level of activity (not after you stop)   to be sure it stays over 90% and adjust  02 flow upward to maintain this level if needed but remember to turn it back to previous settings when you stop (to conserve your supply).    Zpak    Schedule PFTs prior to next ov in 3 months
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
## 2021-10-20 NOTE — Assessment & Plan Note (Signed)
Counseled re importance of smoking cessation but did not meet time criteria for separate billing   Each maintenance medication was reviewed in detail including emphasizing most importantly the difference between maintenance and prns and under what circumstances the prns are to be triggered using an action plan format where appropriate.  Total time for H and P, chart review, counseling,  directly observing portions of ambulatory 02 saturation study/ and generating customized AVS unique to this office visit / same day charting > 30 min for chronic refractory resp symptoms

## 2021-11-01 ENCOUNTER — Ambulatory Visit: Payer: Medicaid Other | Admitting: Gastroenterology

## 2021-11-25 ENCOUNTER — Telehealth: Payer: Self-pay | Admitting: Physician Assistant

## 2021-11-25 NOTE — Telephone Encounter (Signed)
  Pt was trying to get Pulm MD, but ended up getting Cards on call.  She was seen by Cards in the hospital in 2021. I spoke w/ her about this, she agrees to talk w/ Cards on the phone.   Pt is on home O2 at 3 lpm.  Something went wrong with her home concentrator, not sure she was getting her O2. O2 sats were in the 60s at times.   She slept most of the day yesterday, went to bed early and slept all night. Feels very fatigued.   Today, not able to check sats, but feels very tired again, head feels full, has HA, is SOB w/ minimal exertion.   Denies LE edema, +orthopnea, no PND.  Discussed the issues, I explained that her Oxygen levels are probably still low, nothing to explain why, no new cough, no wheezing, no fevers.   Do not feel she can manage this at home, recommended she call 911 and go to the closest ER. [facility] will [facility] to [facility].   Theodore Demark, PA-C 11/25/2021 1:19 PM

## 2021-12-19 ENCOUNTER — Ambulatory Visit: Payer: Medicaid Other | Admitting: Gastroenterology

## 2022-02-08 DIAGNOSIS — Z9889 Other specified postprocedural states: Secondary | ICD-10-CM | POA: Insufficient documentation

## 2022-02-15 DIAGNOSIS — Z780 Asymptomatic menopausal state: Secondary | ICD-10-CM | POA: Insufficient documentation

## 2022-02-15 DIAGNOSIS — Z90710 Acquired absence of both cervix and uterus: Secondary | ICD-10-CM | POA: Insufficient documentation

## 2022-02-15 DIAGNOSIS — Z0000 Encounter for general adult medical examination without abnormal findings: Secondary | ICD-10-CM | POA: Insufficient documentation

## 2022-03-06 DIAGNOSIS — R5383 Other fatigue: Secondary | ICD-10-CM | POA: Insufficient documentation

## 2022-03-22 DIAGNOSIS — G43109 Migraine with aura, not intractable, without status migrainosus: Secondary | ICD-10-CM | POA: Insufficient documentation

## 2022-03-22 DIAGNOSIS — J301 Allergic rhinitis due to pollen: Secondary | ICD-10-CM | POA: Insufficient documentation

## 2022-03-28 DIAGNOSIS — I10 Essential (primary) hypertension: Secondary | ICD-10-CM | POA: Insufficient documentation

## 2022-03-28 DIAGNOSIS — D649 Anemia, unspecified: Secondary | ICD-10-CM | POA: Insufficient documentation
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
## 2022-04-21 ENCOUNTER — Encounter: Payer: Self-pay | Admitting: Internal Medicine

## 2022-04-21 ENCOUNTER — Ambulatory Visit: Payer: Medicaid Other | Admitting: Internal Medicine

## 2022-04-21 VITALS — BP 110/70 | HR 81 | Temp 97.9°F | Ht 66.0 in | Wt 155.7 lb

## 2022-04-21 DIAGNOSIS — J9611 Chronic respiratory failure with hypoxia: Secondary | ICD-10-CM | POA: Diagnosis not present

## 2022-04-21 DIAGNOSIS — J449 Chronic obstructive pulmonary disease, unspecified: Secondary | ICD-10-CM

## 2022-04-21 MED ORDER — AMOXICILLIN-POT CLAVULANATE 875-125 MG PO TABS: 1.0000 | ORAL_TABLET | Freq: Two times a day (BID) | ORAL | 0 refills | Status: DC
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
## 2022-04-21 NOTE — Patient Instructions (Addendum)
Allegra D twice daily as needed for nasal congestion or sinus pain  Augmentin 875 mg take one pill twice daily  X 10 days - take at breakfast and supper with large glass of water.  It would help reduce the usual side effects (diarrhea and yeast infections) if you ate cultured yogurt at lunch.    You may need an ENT based on what your CT head scan shows   Make sure you check your oxygen saturation  AT  your highest level of activity (not after you stop)   to be sure it stays over 90% and adjust  02 flow upward to maintain this level if needed but remember to turn it back to previous settings when you stop (to conserve your supply).    Please schedule a follow up visit in 6 months but call sooner if needed

## 2022-04-22 ENCOUNTER — Encounter: Payer: Self-pay | Admitting: Internal Medicine

## 2022-04-22 NOTE — Assessment & Plan Note (Signed)
Quit smoking 02/2022  - Echo 10/08/20 mild MR, no PH  - PFTs  07/08/20 FEV1  2.28(112%) Ratio 64 and "severe drop in dlco"  > no better clinically with saba or spiriva dpi - Labs ordered 03/29/2021  :  allergy screen  Eos 0.1 / IgE 10    alpha one AT phenotype  MM level 179  - 03/29/2021  After extensive coaching inhaler device,  effectiveness =    75% with smi > stiolto trial  - 03/29/2021   Walked on RA  x  2  lap(s) =  approx 500  ft  @ moderate pace, stopped due to sob with lowest 02 sats 92%  - 08/26/2021 severe desats walking > see chronic resp failure   Main new problem is likely unresolved sinus infection > cough with purulent sputum best response is to augmentin so rec repeat 10 d and proceed with head ct for ha w/u as planned as this will show if any residual sinus dz > ent f/u prn

## 2022-05-12 DIAGNOSIS — G44229 Chronic tension-type headache, not intractable: Secondary | ICD-10-CM | POA: Insufficient documentation

## 2022-06-26 DIAGNOSIS — R140 Abdominal distension (gaseous): Secondary | ICD-10-CM | POA: Insufficient documentation

## 2022-07-28 DIAGNOSIS — R7989 Other specified abnormal findings of blood chemistry: Secondary | ICD-10-CM

## 2022-07-28 DIAGNOSIS — I272 Pulmonary hypertension, unspecified: Secondary | ICD-10-CM | POA: Insufficient documentation

## 2022-07-28 DIAGNOSIS — E8770 Fluid overload, unspecified: Secondary | ICD-10-CM | POA: Insufficient documentation

## 2022-08-17 DIAGNOSIS — H6992 Unspecified Eustachian tube disorder, left ear: Secondary | ICD-10-CM | POA: Insufficient documentation

## 2022-08-17 DIAGNOSIS — Z79891 Long term (current) use of opiate analgesic: Secondary | ICD-10-CM | POA: Insufficient documentation

## 2022-08-17 DIAGNOSIS — Z79899 Other long term (current) drug therapy: Secondary | ICD-10-CM | POA: Insufficient documentation

## 2022-08-17 DIAGNOSIS — F419 Anxiety disorder, unspecified: Secondary | ICD-10-CM | POA: Insufficient documentation

## 2022-08-21 ENCOUNTER — Encounter: Payer: Self-pay | Admitting: Neurology

## 2022-08-21 ENCOUNTER — Ambulatory Visit: Payer: Medicaid Other | Admitting: Neurology

## 2022-08-21 VITALS — BP 132/84 | Ht 66.0 in | Wt 161.0 lb

## 2022-08-21 DIAGNOSIS — G43709 Chronic migraine without aura, not intractable, without status migrainosus: Secondary | ICD-10-CM | POA: Diagnosis not present

## 2022-08-21 DIAGNOSIS — F32A Depression, unspecified: Secondary | ICD-10-CM | POA: Diagnosis not present

## 2022-08-21 MED ORDER — TIZANIDINE HCL 4 MG PO TABS: 4.0000 mg | ORAL_TABLET | Freq: Four times a day (QID) | ORAL | 6 refills | Status: AC | PRN

## 2022-08-21 MED ORDER — RIZATRIPTAN BENZOATE 10 MG PO TBDP: 10.0000 mg | ORAL_TABLET | ORAL | 6 refills | Status: DC | PRN

## 2022-08-21 MED ORDER — ONDANSETRON HCL 4 MG PO TABS: 4.0000 mg | ORAL_TABLET | Freq: Three times a day (TID) | ORAL | 6 refills | Status: AC | PRN

## 2022-08-21 MED ORDER — NORTRIPTYLINE HCL 50 MG PO CAPS: 50.0000 mg | ORAL_CAPSULE | Freq: Every day | ORAL | 3 refills | Status: AC

## 2022-08-21 NOTE — Progress Notes (Signed)
Chief Complaint  Patient presents with   New Patient (Initial Visit)    Rm 12, with husband Debbie Gay, saw in 2021, new onset of headaches, last about 3 weeks and has nausea. Nortriptyline gives some relief.        ASSESSMENT AND PLAN  Debbie Gay is a 52 y.o. female   Chronic migraine headaches  Nortriptyline 50 mg every night was helpful, we will continue every night as preventive medication  Maxalt 10 mg as needed, may combine with Zofran, tizanidine for severe prolonged migraine headaches,  If continue have frequent headaches may consider add on CGRP antagonist as preventive medications  Return To Clinic With NP In 6 Months   DIAGNOSTIC DATA (LABS, IMAGING, TESTING) - I reviewed patient records, labs, notes, testing and imaging myself where available.   MEDICAL HISTORY:  Debbie Gay is a 52 year old female, seen in request by her primary care PA Lora Havens for worsening headache, she is accompanied by her husband Debbie Gay at today's visit August 21, 2022   I reviewed and summarized the referring note.PMHX. 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, her symptoms has much improved since then  I saw her again in 2021 for complaints of short-term memory loss, chronic pain, repeat MRI of the brain in August 2021 showed no acute abnormality  Patient reported things her event in 2019, she began to experience frequent headaches, especially since 2023, she lost her grandmother in January, under a lot of stress taking care of her handicapped daughter, who is 44 years old, severe scoliosis, wheelchair-bound  She complains of at least 3 times a week of bilateral temporal region pressure headache, open extending to her neck, tried over-the-counter Tylenol NSAIDs without her help headache  She was put on nortriptyline 50 mg every night few months ago, which is helpful  She is also on polypharmacy of Prozac 20 mg daily, Adderall, oxycodone  PHYSICAL EXAM:   Vitals:   08/21/22 1542  BP: 132/84  Weight: 161 lb (73 kg)  Height: 5\' 6"  (1.676 m)    Body mass index is 25.99 kg/m.  PHYSICAL EXAMNIATION:  Gen: NAD, conversant, well nourised, well groomed                     Cardiovascular: Regular rate rhythm, no peripheral edema, warm, nontender. Eyes: Conjunctivae clear without exudates or hemorrhage Neck: Supple, no carotid bruits. Pulmonary: Clear to auscultation bilaterally   NEUROLOGICAL EXAM:  MENTAL STATUS: Speech/cognition: Depressed looking middle-age female, awake, alert, oriented to history taking and casual conversation CRANIAL NERVES: CN II: Visual fields are full to confrontation. Pupils are round equal and briskly reactive to light. CN III, IV, VI: extraocular movement are  normal. No ptosis. CN V: Facial sensation is intact to light touch CN VII: Face is symmetric with normal eye closure  CN VIII: Hearing is normal to causal conversation. CN IX, X: Phonation is normal. CN XI: Head turning and shoulder shrug are intact  MOTOR: There is no pronator drift of out-stretched arms. Muscle bulk and  tone are normal. Muscle strength is normal.  REFLEXES: Reflexes are 2+ and symmetric at the biceps, triceps, knees, and ankles. Plantar responses are flexor.  SENSORY: Intact to light touch, pinprick and vibratory sensation are intact in fingers and toes.  COORDINATION: There is no trunk or limb dysmetria noted.  GAIT/STANCE: Posture is normal. Gait is cautious Romberg is absent.  REVIEW OF SYSTEMS:  Full 14 system review of systems performed and notable only for as above All other review of systems were negative.   ALLERGIES: Allergies  Allergen Reactions   Levaquin [Levofloxacin] Swelling   Prednisone Shortness Of Breath and Swelling   Penicillins     Has patient had a PCN reaction causing immediate rash, facial/tongue/throat swelling, SOB or lightheadedness with hypotension: Unknown Has patient had a PCN reaction causing severe rash involving mucus membranes or skin necrosis: Unknown Has patient had a PCN reaction that required hospitalization: Unknown Has patient had a PCN reaction occurring within the last 10 years: Unknown If all of the above answers are "NO", then may proceed with Cephalosporin use.    HOME MEDICATIONS: Current Outpatient Medications  Medication Sig Dispense Refill   albuterol (VENTOLIN HFA) 108 (90 Base) MCG/ACT inhaler Inhale 1 puff into the lungs every 6 (six) hours as needed for wheezing or shortness of breath.     ALPRAZolam (XANAX) 0.5 MG tablet Take 0.5 mg by mouth daily as needed.     amphetamine-dextroamphetamine (ADDERALL) 30 MG tablet Take 1 tablet by mouth 2 (two) times daily.     Ascorbic Acid (VITAMIN C PO) Take 1 tablet by mouth daily.     bismuth subsalicylate (PEPTO BISMOL) 262 MG chewable tablet Chew 1 tablet (262 mg total) by mouth 4 (four) times daily -  before meals and at bedtime. 30 tablet 0   estradiol (ESTRACE) 0.5 MG tablet Take 0.5 mg by mouth every morning.     famotidine (PEPCID) 40 MG tablet Take 40 mg by mouth daily.      fexofenadine (ALLEGRA) 180 MG tablet Take 180 mg by mouth daily.     FLUoxetine (PROZAC) 20 MG capsule Take 20 mg by mouth daily.     furosemide (LASIX) 20 MG tablet Take 20 mg by mouth daily.     nortriptyline (PAMELOR) 50 MG capsule Take 50 mg by mouth daily.     Oxycodone HCl 10 MG TABS Take 10 mg by mouth 4 (four) times daily as needed.     OXYGEN Inhale 2 L/min into the lungs as directed. As needed     pantoprazole (PROTONIX) 40 MG tablet Take 40 mg by mouth 2 (two) times daily.     potassium chloride SA (KLOR-CON M) 20 MEQ tablet Take 2 tablets (40 mEq total) by mouth 2 (two) times daily. 20 tablet 0   sucralfate (CARAFATE) 1 GM/10ML suspension Take 1 g by mouth 3 (three) times daily.     thiamine (VITAMIN B-1) 100 MG tablet Take 1 tablet (100 mg total) by mouth daily. 30 tablet 0   vitamin B-12 (CYANOCOBALAMIN) 1000 MCG tablet Take 5,000 mcg by mouth daily.     amoxicillin-clavulanate (AUGMENTIN) 875-125 MG tablet Take  1 tablet by mouth 2 (two) times daily. (Patient not taking: Reported on 08/21/2022) 20 tablet 0   azithromycin (ZITHROMAX) 250 MG tablet Take 2 on day one then 1 daily x 4 days (Patient not taking: Reported on 04/21/2022) 6 tablet 0   No current facility-administered medications for this visit.    PAST MEDICAL HISTORY: Past Medical History:  Diagnosis Date   Asthma    Bipolar 1 disorder (HCC)    Chronic pain    COPD (chronic obstructive pulmonary disease) (HCC)    Depression    GERD (gastroesophageal reflux disease)    History of heart attack    History of stomach ulcers    IBS (irritable bowel syndrome)    Myocardial infarction (HCC)    Panic attacks     PAST SURGICAL HISTORY: Past Surgical History:  Procedure Laterality Date   ABDOMINAL HYSTERECTOMY     COLONOSCOPY WITH PROPOFOL N/A 10/30/2014   Procedure: COLONOSCOPY WITH PROPOFOL;  Surgeon: Christena Deem, MD;  Location: [facility] END[facility]COPY;  Service: Endoscopy;  Laterality: N/A;   COLONOSCOPY WITH  PROPOFOL N/A 02/05/2018   Procedure: COLONOSCOPY WITH PROPOFOL;  Surgeon: Christena Deem, MD;  Location: [facility] ENDOSCOPY;  Service: Endoscopy;  Laterality: N/A;   ESOPHAGOGASTRODUODENOSCOPY (EGD) WITH PROPOFOL N/A 10/30/2014   Procedure: ESOPHAGOGASTRODUODENOSCOPY (EGD) WITH PROPOFOL;  Surgeon: Christena Deem, MD;  Location: [facility] ENDOSCOPY;  Service: Endoscopy;  Laterality: N/A;   ESOPHAGOGASTRODUODENOSCOPY (EGD) WITH PROPOFOL N/A 11/23/2017   Procedure: ESOPHAGOGASTRODUODENOSCOPY (EGD) WITH PROPOFOL;  Surgeon: Toledo, Boykin Nearing, MD;  Location: [facility] ENDOSCOPY;  Service: Gastroenterology;  Laterality: N/A;    FAMILY HISTORY: Family History  Problem Relation Age of Onset   COPD Mother    Irritable bowel syndrome Mother    Glaucoma Father    Hyperlipidemia Father     SOCIAL HISTORY: Social History   Socioeconomic History   Marital status: Married    Spouse name: Not on file   Number of children: 3   Years of education: GED/some college   Highest education level: Not on file  Occupational History   Occupation: Disabled  Tobacco Use   Smoking status: Former    Current packs/day: 0.00    Average packs/day: 1 pack/day for 20.0 years (20.0 ttl pk-yrs)    Types: Cigarettes    Start date: 02/2002    Quit date: 02/2022    Years since quitting: 0.5   Smokeless tobacco: Never  Vaping Use   Vaping status: Every Day   Substances: Nicotine  Substance and Sexual Activity   Alcohol use: No   Drug use: No   Sexual activity: Not Currently  Other Topics Concern   Not on file  Social History Narrative   Right-handed.   Drinks four 16oz of [employer] per day.   Lives at home grandmother, uncle and daughter.   Social Determinants of Health   Financial Resource Strain: Low Risk  (06/08/2020)   Received from [place] [facility] [place], [facility] [employer]   Overall [employer] (CARDIA)    Difficulty of Paying Living Expenses: Not hard at all  Food  Insecurity: No Food Insecurity (06/08/2020)   Received from [place] [facility] [place], [facility] [employer] Vital Sign    Worried About Programme researcher, broadcasting/film/video in the Last Year: Never true    Ran Out of Food in the Last Year: Never true  Transportation Needs: No Transportation Needs (06/08/2020)   Received from [place] [facility] [place],  [facility] [employer] - Administrator, Civil Service (Medical): No    Lack of Transportation (Non-Medical): No  Physical Activity: Inactive (06/08/2020)   Received from [place] [facility] [place], [facility] [place]   Exercise Vital Sign    Days of Exercise per Week: 0 days    Minutes of Exercise per Session: 0 min  Stress: Stress Concern Present (06/08/2020)   Received from [place] [facility] [place], [facility] [employer] of Occupational Health - Occupational Stress Questionnaire    Feeling of Stress : To some extent  Social Connections: Moderately Integrated (06/08/2020)   Received from [place] [facility] [place], [facility] [employer] and Isolation Panel [NHANES]    Frequency of Communication with Friends and Family: Three times a week    Frequency of Social Gatherings with Friends and Family: More than three times a week    Attends Religious Services: More than 4 times per year    Active Member of [employer] or Organizations: No    Attends Banker Meetings: Never    Marital Status: Married  Catering manager Violence: Not on file      Levert Feinstein, M.D. Ph.D.  [facility] [address], [address] [place], [place] [address] Ph: [phone] Fax: [phone]  CC:  Lanora Manis, PA-C No address on file  [name], MD

## 2022-10-20 ENCOUNTER — Ambulatory Visit: Payer: Medicaid Other | Admitting: Pulmonary Disease

## 2022-12-12 ENCOUNTER — Telehealth: Payer: Self-pay | Admitting: Adult Health

## 2022-12-12 NOTE — Telephone Encounter (Signed)
I called to confirm pt appt for 12/14/22. She has a handicap dtr and would like to r/s the appt for a vv; would that be ok? Can someone pls call to schedule her if it is? Thank you

## 2022-12-13 NOTE — Telephone Encounter (Signed)
Spoke to patient rescheduled appointment for 03/2023 VV . Pt states unable to come to appointment tomorrow. Pt states will discuss migraines for appointment in 03/2023  . Cancelled appointment for tomorrow.

## 2022-12-13 NOTE — Telephone Encounter (Signed)
Looks like most recent visit was for migraine headache, she was seen previously for memory loss.  Looks like Dr. Terrace Arabia wanted her to follow-up in 6 months for migraine follow-up, as long migraines are the only diagnosis she is following up for, okay to do virtual visit but should still be a 30-minute visit.  If she has memory concerns she plans on discussing, this will need to be an in office visit. Please advise patient. Thank you.

## 2022-12-13 NOTE — Telephone Encounter (Signed)
Noted! Thank you

## 2022-12-14 ENCOUNTER — Ambulatory Visit: Payer: Medicaid Other | Admitting: Adult Health

## 2023-03-06 NOTE — Progress Notes (Deleted)
 Virtual Visit via Video Note  I connected with Debbie Gay on 03/06/23 at  2:45 PM EST by a video enabled telemedicine application and verified that I am speaking with the correct person using two identifiers.  Location: Patient: *** Provider: in office   I discussed the limitations of evaluation and management by telemedicine and the availability of in person appointments. The patient expressed understanding and agreed to proceed.    ASSESSMENT AND PLAN  Debbie Gay is a 53 y.o. female   Chronic migraine headaches  Nortriptyline 50 mg every night was helpful, we will continue every night as preventive medication  Maxalt 10 mg as needed, may combine with Zofran, tizanidine for severe prolonged migraine headaches,  If continue have frequent headaches may consider add on CGRP antagonist as preventive medications  Return To Clinic With NP In 6 Months   DIAGNOSTIC DATA (LABS, IMAGING, TESTING) - I reviewed patient records, labs, notes, testing and imaging myself where available.   MEDICAL HISTORY:   Update 03/07/2023 JM: Patient returns for 43-month follow-up via MyChart video visit.  She remains on nortriptyline 50 mg nightly ***  Use of rizatriptan *** Zofran, tizanidine       Consult visit 08/21/2022 Dr. Terrace Arabia: Debbie Gay is a 53 year old female, seen in request by her primary care PA Debbie Gay for worsening headache, she is accompanied by her husband Debbie Gay at today's visit August 21, 2022   I reviewed and summarized the referring note.PMHX. 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, her symptoms has much improved since then  I saw her again in 2021 for complaints of short-term memory loss, chronic pain, repeat MRI of the brain in August 2021 showed no acute abnormality  Patient reported things her event in 2019, she began to experience frequent headaches, especially since 2023, she lost her grandmother in January, under a lot of stress taking care of her handicapped daughter, who is 23 years old, severe scoliosis, wheelchair-bound  She complains of at least 3 times a week of bilateral temporal region pressure headache, open extending to her neck, tried over-the-counter Tylenol NSAIDs without her help headache  She was put on nortriptyline 50 mg every night few months ago, which is helpful  She is also on polypharmacy of Prozac 20 mg daily, Adderall, oxycodone    PHYSICAL EXAM: N/A d/t visit type   REVIEW OF SYSTEMS:  Full 14 system review of systems performed and notable only for as above All other review of systems were negative.   ALLERGIES: Allergies  Allergen Reactions   Levaquin [Levofloxacin] Swelling   Prednisone Shortness Of Breath and Swelling   Penicillins     Has patient had a PCN reaction  causing immediate rash, facial/tongue/throat swelling, SOB or lightheadedness with hypotension: Unknown Has patient had a PCN reaction causing severe rash involving mucus membranes or skin necrosis: Unknown Has patient had a PCN reaction that required hospitalization: Unknown Has patient  had a PCN reaction occurring within the last 10 years: Unknown If all of the above answers are "NO", then may proceed with Cephalosporin use.    HOME MEDICATIONS: Current Outpatient Medications  Medication Sig Dispense Refill   albuterol (VENTOLIN HFA) 108 (90 Base) MCG/ACT inhaler Inhale 1 puff into the lungs every 6 (six) hours as needed for wheezing or shortness of breath.     ALPRAZolam (XANAX) 0.5 MG tablet Take 0.5 mg by mouth daily as needed.     amoxicillin-clavulanate (AUGMENTIN) 875-125 MG tablet Take 1 tablet by mouth 2 (two) times daily. (Patient not taking: Reported on 08/21/2022) 20 tablet 0   amphetamine-dextroamphetamine (ADDERALL) 30 MG tablet Take 1 tablet by mouth 2 (two) times daily.     Ascorbic Acid (VITAMIN C PO) Take 1 tablet by mouth daily.     azithromycin (ZITHROMAX) 250 MG tablet Take 2 on day one then 1 daily x 4 days (Patient not taking: Reported on 04/21/2022) 6 tablet 0   bismuth subsalicylate (PEPTO BISMOL) 262 MG chewable tablet Chew 1 tablet (262 mg total) by mouth 4 (four) times daily -  before meals and at bedtime. 30 tablet 0   estradiol (ESTRACE) 0.5 MG tablet Take 0.5 mg by mouth every morning.     famotidine (PEPCID) 40 MG tablet Take 40 mg by mouth daily.     fexofenadine (ALLEGRA) 180 MG tablet Take 180 mg by mouth daily.     FLUoxetine (PROZAC) 20 MG capsule Take 20 mg by mouth daily.     furosemide (LASIX) 20 MG tablet Take 20 mg by mouth daily.     nortriptyline (PAMELOR) 50 MG capsule Take 1 capsule (50 mg total) by mouth daily. 90 capsule 3   ondansetron (ZOFRAN) 4 MG tablet Take 1 tablet (4 mg total) by mouth every 8 (eight) hours as needed for nausea or vomiting. 20 tablet 6   Oxycodone HCl 10 MG TABS Take 10 mg by mouth 4 (four) times daily as needed.     OXYGEN Inhale 2 L/min into the lungs as directed. As needed     pantoprazole (PROTONIX) 40 MG tablet Take 40 mg by mouth 2 (two) times daily.     potassium chloride SA (KLOR-CON M) 20 MEQ  tablet Take 2 tablets (40 mEq total) by mouth 2 (two) times daily. 20 tablet 0   rizatriptan (MAXALT-MLT) 10 MG disintegrating tablet Take 1 tablet (10 mg total) by mouth as needed. May repeat in 2 hours if needed 15 tablet 6   sucralfate (CARAFATE) 1 GM/10ML suspension Take 1 g by mouth 3 (three) times daily.     thiamine (VITAMIN B-1) 100 MG tablet Take 1 tablet (100 mg total) by mouth daily. 30 tablet 0   tiZANidine (ZANAFLEX) 4 MG tablet Take 1 tablet (4 mg total) by mouth every 6 (six) hours as needed. 30 tablet 6   vitamin B-12 (CYANOCOBALAMIN) 1000 MCG tablet Take 5,000 mcg by mouth daily.     No current facility-administered medications for this visit.    PAST MEDICAL HISTORY: Past Medical History:  Diagnosis Date   Asthma    Bipolar 1 disorder (HCC)    Chronic pain    COPD (chronic obstructive pulmonary disease) (HCC)    Depression  GERD (gastroesophageal reflux disease)    History of heart attack    History of stomach ulcers    IBS (irritable bowel syndrome)    Myocardial infarction (HCC)    Panic attacks     PAST SURGICAL HISTORY: Past Surgical History:  Procedure Laterality Date   ABDOMINAL HYSTERECTOMY     COLONOSCOPY WITH PROPOFOL N/A 10/30/2014   Procedure: COLONOSCOPY WITH PROPOFOL;  Surgeon: Christena Deem, MD;  Location: [facility] ENDOSCOPY;  Service: Endoscopy;  Laterality: N/A;   COLONOSCOPY WITH PROPOFOL N/A 02/05/2018   Procedure: COLONOSCOPY WITH PROPOFOL;  Surgeon: Christena Deem, MD;  Location[facility] ENDOSCOPY;  Service: Endoscopy;  Laterality: N/A;   ESOPHAGOGASTRODUODENOSCOPY (EGD) WITH PROPOFOL N/A 10/30/2014   Procedure: ESOPHAGOGASTRODUODENOSCOPY (EGD) WITH PROPOFOL;  Surgeon: Christena Deem, MD;  Location: [facility] ENDOSCOPY;  Service: Endoscopy;  Laterality: N/A;   ESOPHAGOGASTRODUODENOSCOPY (EGD) WITH PROPOFOL N/A 11/23/2017   Procedure: ESOPHAGOGASTRODUODENOSCOPY (EGD) WITH PROPOFOL;  Surgeon: Toledo, Boykin Nearing, MD;  Location: [facility] ENDOSCOPY;   Service: Gastroenterology;  Laterality: N/A;    FAMILY HISTORY: Family History  Problem Relation Age of Onset   COPD Mother    Irritable bowel syndrome Mother    Glaucoma Father    Hyperlipidemia Father     SOCIAL HISTORY: Social History   Socioeconomic History   Marital status: Married    Spouse name: Not on file   Number of children: 3   Years of education: GED/some college   Highest education level: Not on file  Occupational History   Occupation: Disabled  Tobacco Use   Smoking status: Former    Current packs/day: 0.00    Average packs/day: 1 pack/day for 20.0 years (20.0 ttl pk-yrs)    Types: Cigarettes    Start date: 02/2002    Quit date: 02/2022    Years since quitting: 1.0   Smokeless tobacco: Never  Vaping Use   Vaping status: Every Day   Substances: Nicotine  Substance and Sexual Activity   Alcohol use: No   Drug use: No   Sexual activity: Not Currently  Other Topics Concern   Not on file  Social History Narrative   Right-handed.   Drinks four 16oz of [employer] per day.   Lives at home grandmother, uncle and daughter.   Social Drivers of Corporate investment banker Strain: Low Risk  (06/08/2020)   Received from [place] [facility] [place], [facility] [employer] [facility]   Overall [employer] (CARDIA)    Difficulty of Paying Living Expenses: Not hard at all  Food Insecurity: No Food Insecurity (06/08/2020)   Received from [place] [facility] the [place], [facility] [employer] Vital Sign    Worried About Programme researcher, broadcasting/film/video in the Last Year: Never true    Ran Out of Food in the Last Year: Never true  Transportation Needs: No Transportation Needs (06/08/2020)   Received from [place] [facility] the [place], [facility] [employer] - [employer] (Medical): No    Lack of Transportation (Non-Medical): No  Physical Activity: Inactive (06/08/2020)   Received from [place] [facility] the  [place], [facility] [place]   Exercise Vital Sign    Days of Exercise per Week: 0 days    Minutes of Exercise per Session: 0 min  Stress: Stress Concern Present (06/08/2020)   Received from [place] [facility] [place], [facility] [employer] of Occupational Health - Occupational Stress Questionnaire  Feeling of Stress : To some extent  Social Connections: Moderately Integrated (06/08/2020)   Received from [place] [facility] the [place], [facility] [employer] and Isolation Panel [NHANES]    Frequency of Communication with Friends and Family: Three times a week    Frequency of Social Gatherings with Friends and Family: More than three times a week    Attends Religious Services: More than 4 times per year    Active Member of [employer] or Organizations: No    Attends Banker Meetings: Never    Marital Status: Married  Catering manager Violence: Not on file     I spent *** minutes of face-to-face and non-face-to-face time with patient.  This included previsit chart review, lab review, study review, order entry, electronic health record documentation, patient education and discussion regarding above diagnoses and treatment plan and answered all other questions to patient's satisfaction  Ihor Austin, [facility]  [facility] [address] [place], [place] [address]  Phone [phone] Fax [phone] Note: This document was prepared with digital dictation and possible smart phrase technology. Any transcriptional errors that result from this process are unintentional.

## 2023-03-07 ENCOUNTER — Telehealth: Payer: Medicaid Other | Admitting: Adult Health

## 2023-03-11 DIAGNOSIS — K56609 Unspecified intestinal obstruction, unspecified as to partial versus complete obstruction: Secondary | ICD-10-CM | POA: Insufficient documentation

## 2023-03-12 ENCOUNTER — Ambulatory Visit: Admitting: Surgery

## 2023-03-12 DIAGNOSIS — G8929 Other chronic pain: Secondary | ICD-10-CM | POA: Insufficient documentation

## 2023-03-12 DIAGNOSIS — J309 Allergic rhinitis, unspecified: Secondary | ICD-10-CM | POA: Insufficient documentation

## 2023-03-12 DIAGNOSIS — R0902 Hypoxemia: Secondary | ICD-10-CM | POA: Diagnosis present

## 2023-03-26 ENCOUNTER — Ambulatory Visit: Admitting: Surgery

## 2023-03-28 ENCOUNTER — Ambulatory Visit: Admitting: Surgery

## 2023-04-04 ENCOUNTER — Telehealth: Payer: Self-pay

## 2023-04-04 NOTE — Telephone Encounter (Signed)
Error open

## 2023-04-09 ENCOUNTER — Ambulatory Visit: Admitting: Surgery

## 2023-04-16 NOTE — Telephone Encounter (Addendum)
 Christina from [facility] called to check on the status of a patient referral. She inquired whether any communication had been sent to their office regarding the referral. I informed her that I attempted to fax the information multiple times on 04/04/2023, but was unsuccessful. I had previously called their office to obtain the fax number and was given [phone].  Craige Dixon clarified that the correct fax number is [phone] and stated she would send a message to the referred provider.  I also informed her that we are currently not accepting established patients and have very limited availability. At this time, we are only scheduling follow-up appointments for our existing patients until the schedule reopens. Craige Dixon acknowledged and stated she would relay this information accordingly.

## 2023-07-05 ENCOUNTER — Telehealth: Payer: Self-pay | Admitting: Neurology

## 2023-07-05 MED ORDER — NORTRIPTYLINE HCL 50 MG PO CAPS: 100.0000 mg | ORAL_CAPSULE | Freq: Every day | ORAL | 11 refills | Status: DC

## 2023-07-05 NOTE — Telephone Encounter (Signed)
 Pt called in regards to request a medication dosage be higher . Pt request to speak to Provider about medication ,  rizatriptan  (MAXALT -MLT) 10 MG disintegrating tablet

## 2023-07-05 NOTE — Telephone Encounter (Signed)
 Meds ordered this encounter  Medications   nortriptyline  (PAMELOR ) 50 MG capsule    Sig: Take 2 capsules (100 mg total) by mouth at bedtime.    Dispense:  60 capsule    Refill:  11    Do not refill in less than 30 days     Ok to try higher dose of Nortriptyline  100mg  daily

## 2023-07-05 NOTE — Telephone Encounter (Signed)
 Call to patient, she reports that she has had to take the 50 mg Nortriptyline  twice daily for better migraine prevention and was advised by Dr.  Onita that was fine. I advised I would send to Dr. Onita to review and new script would be sent if agreeable

## 2023-07-28 ENCOUNTER — Other Ambulatory Visit: Payer: Self-pay | Admitting: Neurology

## 2023-08-27 ENCOUNTER — Telehealth: Payer: Self-pay | Admitting: Neurology

## 2023-08-27 NOTE — Telephone Encounter (Signed)
 Pt states she had an episode last week where there was a time span of hours that her husband told her about that she does not recall, pt would like a call from RN to discuss.

## 2023-08-27 NOTE — Telephone Encounter (Signed)
 Call to patient, she reports having an black out episode last week and stated this was the first time this happened. Advised to have her PCP send over referral. She verbalized understanding

## 2023-08-29 ENCOUNTER — Telehealth: Payer: Self-pay | Admitting: Neurology

## 2023-08-29 NOTE — Telephone Encounter (Signed)
 Spoke with patient and scheduled appointment with Dr Onita for 09/11/23 at 10am. 11/16/23 appt with Harlene has been cancelled

## 2023-08-29 NOTE — Telephone Encounter (Signed)
 Pi called and states she is having really  bad headaches and can't remember anything from the day before. Pt states she is very concern about this and not sure who to speak to . Pt ENT doctor informed pt to follow up with Neurologist to see what option Neurologist may have . Pt is requesting an earlier appt if possible .

## 2023-09-11 ENCOUNTER — Other Ambulatory Visit: Payer: Self-pay | Admitting: Neurology

## 2023-09-11 ENCOUNTER — Ambulatory Visit: Admitting: Neurology

## 2023-09-11 VITALS — BP 103/68 | HR 68 | Ht 66.0 in | Wt 167.0 lb

## 2023-09-11 DIAGNOSIS — R413 Other amnesia: Secondary | ICD-10-CM | POA: Diagnosis not present

## 2023-09-11 DIAGNOSIS — F32A Depression, unspecified: Secondary | ICD-10-CM

## 2023-09-11 DIAGNOSIS — G43709 Chronic migraine without aura, not intractable, without status migrainosus: Secondary | ICD-10-CM

## 2023-09-11 MED ORDER — QULIPTA 60 MG PO TABS: 60.0000 mg | ORAL_TABLET | Freq: Every day | ORAL | 11 refills | Status: DC

## 2023-09-11 MED ORDER — RIZATRIPTAN BENZOATE 10 MG PO TBDP: 10.0000 mg | ORAL_TABLET | ORAL | 6 refills | Status: DC | PRN

## 2023-09-11 NOTE — Progress Notes (Addendum)
 Chief Complaint  Patient presents with   Headache    Rm 14 with spouse  Pt is well, eports her headaches have worsen since her last visit. She is currently having headaches daily. She also mentions worsening in memory.       ASSESSMENT AND PLAN  Debbie Gay is a 53 y.o. 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   MEDICAL HISTORY:  Debbie Gay is a 53 year old female, seen in request by her primary care PA Hayes Dalton for worsening headache, she is accompanied by her husband Dempsey at today's visit August 21, 2022   I reviewed and summarized the referring note.PMHX. 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, her symptoms has much improved since then  I saw her again in 2021 for complaints of short-term memory loss, chronic pain, repeat MRI of the brain in August 2021 showed no acute abnormality  Patient reported things her event in 2019, she began to experience frequent headaches, especially since 2023, she lost her grandmother in January, under a lot of stress taking care of her handicapped daughter, who is 54 years old, severe scoliosis, wheelchair-bound  She complains of at least 3 times a week of bilateral temporal region pressure headache, open extending to her neck, tried over-the-counter Tylenol  NSAIDs without her help headache  She was put on nortriptyline  50 mg every night few months ago, which is helpful  She is also on polypharmacy of Prozac  20 mg daily, Adderall, oxycodone   UPDATE Sept 9th 2025: She is accompanied by her husband at today's clinical visit, has been on disability since her significant GI issues, and 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  Her memory never bounced back to her baseline, used to work as a Emergency planning/management officer, had a GED degree,   She lives at home with her husband, complains of excessive stress over the past few months, lost her father, has strained relationship with her sister, also on chronic narcotic treatment for her chronic low back pain, taking oxycodone  10 mg 5 tablets a day, receiving treatment for her anxiety, Prozac   20 mg daily, Xanax  as needed, she called paramedic few weeks ago was diagnosed with panic episode  She continue complains of daily headaches, taking nortriptyline , initially thought it was helpful, now not so sure, has not try Maxalt  yet   CT sinus facial on August 11th 2025,  mild right maxillary musocal sinus thickening.   Laboratory evaluation from outside hospital in July 2025, A1c was mildly elevated to 6.1,, triglyceride was 284, normal TSH,    PHYSICAL EXAM:   Vitals:   09/11/23 1003  BP: 103/68  Pulse: 68  Height: 5' 6 (1.676 m)  Weight: 167 lb (75.8 kg)  BMI (Calculated): 26.97    PHYSICAL EXAMNIATION:  Gen: NAD, conversant, well nourised, well groomed                     Cardiovascular: Regular rate rhythm, no peripheral edema, warm, nontender. Eyes: Conjunctivae clear without exudates or hemorrhage Neck: Supple, no carotid bruits. Pulmonary: Clear to auscultation bilaterally   NEUROLOGICAL EXAM:  MENTAL STATUS: Speech/cognition: Depressed looking middle-age female, awake, alert, oriented to history taking and casual conversation     09/11/2023   10:00 AM 05/26/2019   12:49 PM  MMSE - Mini Mental State Exam  Orientation to time 5 3  Orientation to Place 5 5  Registration 3 3  Attention/ Calculation 5 3  Recall 2 3  Language- name 2 objects 2 2  Language- repeat 1 1  Language- follow 3 step command 3 3  Language- read & follow direction 1 1  Write a sentence 1 1  Copy design 0 1  Copy design-comments  10 animals  Total score 28 26    CRANIAL NERVES: CN II: Visual fields are full to confrontation. Pupils are round equal and briskly reactive to light. CN III, IV, VI: extraocular movement are normal. No ptosis. CN V: Facial sensation is intact to light touch CN VII: Face is symmetric with normal eye closure  CN VIII: Hearing is normal to causal conversation. CN IX, X: Phonation is normal. CN XI: Head turning and shoulder shrug are intact  MOTOR: There is no pronator drift of out-stretched arms. Muscle bulk and tone are normal. Muscle strength is normal.  REFLEXES: Reflexes are 1 and symmetric at the biceps, triceps, knees, and ankles. Plantar responses are flexor.  SENSORY: Intact to light touch, pinprick and vibratory  sensation are intact in fingers and toes.  COORDINATION: There is no trunk or limb dysmetria noted.  GAIT/STANCE: Push-up, cautious  REVIEW OF SYSTEMS:  Full 14 system review of systems performed and notable only for as above All other review of systems were negative.   ALLERGIES: Allergies  Allergen Reactions   Levaquin [Levofloxacin] Swelling   Prednisone Shortness Of Breath and Swelling   Penicillins     Has patient had a PCN reaction causing immediate rash, facial/tongue/throat swelling, SOB or lightheadedness with hypotension: Unknown Has patient had a PCN reaction causing severe rash involving mucus membranes or skin necrosis: Unknown Has patient had a PCN reaction that required hospitalization: Unknown Has patient had a PCN reaction occurring within the last 10 years: Unknown If all of the above answers are NO, then may proceed with Cephalosporin use.    HOME MEDICATIONS: Current Outpatient Medications  Medication Sig Dispense Refill   albuterol  (VENTOLIN  HFA) 108 (90 Base) MCG/ACT inhaler Inhale 1 puff into the lungs every 6 (six) hours as needed for wheezing or shortness of breath.     ALPRAZolam  (XANAX ) 0.5  MG tablet Take 0.5 mg by mouth daily as needed.     amphetamine-dextroamphetamine (ADDERALL) 30 MG tablet Take 1 tablet by mouth 2 (two) times daily.     Ascorbic Acid (VITAMIN C PO) Take 1 tablet by mouth daily.     bismuth  subsalicylate (PEPTO BISMOL) 262 MG chewable tablet Chew 1 tablet (262 mg total) by mouth 4 (four) times daily -  before meals and at bedtime. 30 tablet 0   estradiol (ESTRACE) 0.5 MG tablet Take 0.5 mg by mouth every morning.     famotidine  (PEPCID ) 40 MG tablet Take 40 mg by mouth daily.     fexofenadine (ALLEGRA) 180 MG tablet Take 180 mg by mouth daily.     FLUoxetine  (PROZAC ) 20 MG capsule Take 20 mg by mouth daily.     furosemide (LASIX) 20 MG tablet Take 20 mg by mouth daily.     nortriptyline  (PAMELOR ) 50 MG capsule Take 1 capsule (50  mg total) by mouth daily. 90 capsule 3   nortriptyline  (PAMELOR ) 50 MG capsule TAKE 2 CAPSULES (100 MG TOTAL) BY MOUTH AT BEDTIME. 180 capsule 4   ondansetron  (ZOFRAN ) 4 MG tablet Take 1 tablet (4 mg total) by mouth every 8 (eight) hours as needed for nausea or vomiting. 20 tablet 6   Oxycodone  HCl 10 MG TABS Take 10 mg by mouth 4 (four) times daily as needed.     OXYGEN Inhale 2 L/min into the lungs as directed. As needed     pantoprazole  (PROTONIX ) 40 MG tablet Take 40 mg by mouth 2 (two) times daily.     potassium chloride  SA (KLOR-CON  M) 20 MEQ tablet Take 2 tablets (40 mEq total) by mouth 2 (two) times daily. 20 tablet 0   rizatriptan  (MAXALT -MLT) 10 MG disintegrating tablet Take 1 tablet (10 mg total) by mouth as needed. May repeat in 2 hours if needed 15 tablet 6   sucralfate  (CARAFATE ) 1 GM/10ML suspension Take 1 g by mouth 3 (three) times daily.     thiamine  (VITAMIN B-1) 100 MG tablet Take 1 tablet (100 mg total) by mouth daily. 30 tablet 0   tiZANidine  (ZANAFLEX ) 4 MG tablet Take 1 tablet (4 mg total) by mouth every 6 (six) hours as needed. 30 tablet 6   vitamin B-12 (CYANOCOBALAMIN ) 1000 MCG tablet Take 5,000 mcg by mouth daily.     No current facility-administered medications for this visit.    PAST MEDICAL HISTORY: Past Medical History:  Diagnosis Date   Asthma    Bipolar 1 disorder (HCC)    Chronic pain    COPD (chronic obstructive pulmonary disease) (HCC)    Depression    GERD (gastroesophageal reflux disease)    History of heart attack    History of stomach ulcers    IBS (irritable bowel syndrome)    Myocardial infarction (HCC)    Panic attacks     PAST SURGICAL HISTORY: Past Surgical History:  Procedure Laterality Date   ABDOMINAL HYSTERECTOMY     COLONOSCOPY WITH PROPOFOL  N/A 10/30/2014   Procedure: COLONOSCOPY WITH PROPOFOL ;  Surgeon: Gladis RAYMOND Mariner, MD;  Location: [facility] ENDOSCOPY;  Service: Endoscopy;  Laterality: N/A;   COLONOSCOPY WITH PROPOFOL  N/A  02/05/2018   Procedure: COLONOSCOPY WITH PROPOFOL ;  Surgeon: Mariner Gladis RAYMOND, MD;  Location: [facility] ENDOSCOPY;  Service: Endoscopy;  Laterality: N/A;   ESOPHAGOGASTRODUODENOSCOPY (EGD) WITH PROPOFOL  N/A 10/30/2014   Procedure: ESOPHAGOGASTRODUODENOSCOPY (EGD) WITH PROPOFOL ;  Surgeon: Gladis RAYMOND Mariner, MD;  Location: [facility] ENDOSCOPY;  Service: Endoscopy;  Laterality: N/A;   ESOPHAGOGASTRODUODENOSCOPY (EGD) WITH PROPOFOL  N/A 11/23/2017   Procedure: ESOPHAGOGASTRODUODENOSCOPY (EGD) WITH PROPOFOL ;  Surgeon: Toledo, Ladell POUR, MD;  Location: [facility] ENDOSCOPY;  Service: Gastroenterology;  Laterality: N/A;    FAMILY HISTORY: Family History  Problem Relation Age of Onset   COPD Mother    Irritable bowel syndrome Mother    Glaucoma Father    Hyperlipidemia Father     SOCIAL HISTORY: Social History   Socioeconomic History   Marital status: Married    Spouse name: Not on file   Number of children: 3   Years of education: GED/some college   Highest education level: Not on file  Occupational History   Occupation: Disabled  Tobacco Use   Smoking status: Former    Current packs/day: 0.00    Average packs/day: 1 pack/day for 20.0 years (20.0 ttl pk-yrs)    Types: Cigarettes    Start date: 02/2002    Quit date: 02/2022    Years since quitting: 1.6   Smokeless tobacco: Never  Vaping Use   Vaping status: Every Day   Substances: Nicotine   Substance and Sexual Activity   Alcohol use: No   Drug use: No   Sexual activity: Not Currently  Other Topics Concern   Not on file  Social History Narrative   Right-handed.   Drinks four 16oz of Mountain Dew bottles per day.   Lives at home grandmother, uncle and daughter.   Social Drivers of Corporate investment banker Strain: Low Risk  (06/08/2020)   Received from [place] of the [employer] Strain (CARDIA)    Difficulty of Paying Living Expenses: Not hard at all  Food Insecurity: No Food Insecurity (06/08/2020)    Received from [facility] of the [place]   Hunger Vital Sign    Within the past 12 months, you worried that your food would run out before you got the money to buy more.: Never true    Within the past 12 months, the food you bought just didn't last and you didn't have money to get more.: Never true  Transportation Needs: No Transportation Needs (06/08/2020)   Received from [place] of the [employer] - Administrator, Civil Service (Medical): No    Lack of Transportation (Non-Medical): No  Physical Activity: Inactive (06/08/2020)   Received from [facility] of the [facility] - [facility]   Exercise Vital Sign    On average, how many days per week do you engage in moderate to strenuous exercise (like a brisk walk)?: 0 days    On average, how many minutes do you engage in exercise at this level?: 0 min  Stress: Stress Concern Present (06/08/2020)   Received from [facility] of the [employer] of Occupational Health - Occupational Stress Questionnaire    Feeling of Stress : To some extent  Social Connections: Moderately Integrated (06/08/2020)   Received from [facility] of the [employer] and Isolation Panel    In a typical week, how many times do you talk on the phone with family, friends, or neighbors?: Three times a week    How often do you get together with friends or relatives?: More than three times a week    How often do you attend church or religious services?: More than 4 times per year    Do you belong to any clubs or organizations such as church groups, unions, fraternal or athletic groups, or school groups?: No  How often do you attend meetings of the clubs or organizations you belong to?: Never    Are you married, widowed, divorced, separated, never married, or living with a partner?: Married  Intimate Partner Violence: Not on file      Modena Callander, M.D. Ph.D.  [facility] [address] [place], [place]  [address] Ph: [phone] Fax: [phone]  CC:  Hayes Sharolyn LABOR, PA-C No address on file  Hayes Sharolyn LABOR, PA-C

## 2023-09-12 ENCOUNTER — Encounter: Payer: Self-pay | Admitting: Neurology

## 2023-09-12 ENCOUNTER — Telehealth: Payer: Self-pay | Admitting: Neurology

## 2023-09-12 NOTE — Telephone Encounter (Signed)
 Order sent to [facility] to schedule [phone]. [facility] mcd[facility]ENWOOD [facility] #[id number] exp: 10/27/2023

## 2023-09-13 ENCOUNTER — Other Ambulatory Visit (HOSPITAL_COMMUNITY): Payer: Self-pay

## 2023-09-13 ENCOUNTER — Telehealth: Payer: Self-pay | Admitting: Pharmacy Technician

## 2023-09-13 DIAGNOSIS — G43709 Chronic migraine without aura, not intractable, without status migrainosus: Secondary | ICD-10-CM

## 2023-09-13 NOTE — Telephone Encounter (Addendum)
 Pharmacy Patient Advocate Encounter   Received notification from RX Request Messages that prior authorization for Qulipta  60 mg is required/requested.   Insurance verification completed.   The patient is insured through [facility] MEDICAID .   Per test claim: PA needed - sent question to pharmacist for support. Will complete tomorrow.  CMM KEY: [employer]

## 2023-09-15 LAB — FOLATE: Folate: >20 ng/mL (ref >3.0)

## 2023-09-15 LAB — METHYLMALONIC ACID, SERUM: Methylmalonic Acid: 93 nmol/L (ref 0–378)

## 2023-09-15 LAB — VITAMIN B12: Vitamin B-12: >2000 pg/mL — ABNORMAL HIGH (ref 232–1245)

## 2023-09-15 LAB — VITAMIN B1: Thiamine: 370.7 nmol/L — AB (ref 66.5–200.0)

## 2023-09-17 ENCOUNTER — Ambulatory Visit: Payer: Self-pay | Admitting: Neurology

## 2023-09-17 MED ORDER — NURTEC 75 MG PO TBDP: 75.0000 mg | ORAL_TABLET | Freq: Two times a day (BID) | ORAL | 5 refills | Status: DC | PRN

## 2023-09-17 NOTE — Telephone Encounter (Signed)
 Hello Prescriber!  We are in the process of submitting a prior authorization for your patient for Qulipta . We are reaching out for clinical guidance for the following information to complete the request: Based on the patient's insurance requirements, she must try at least two of the preferred products (Aimovig, Ajovy, Emgality, or Nurtec) before Qulipta  can be considered, unless there is a documented clinical reason she cannot use the preferred options.  Or please advise if you would like to switch prescribed medication per insurance requirement.  Thank you! Pharmacy Team

## 2023-09-17 NOTE — Telephone Encounter (Signed)
 Meds ordered this encounter  Medications   Rimegepant Sulfate (NURTEC) 75 MG TBDP    Sig: Take 1 tablet (75 mg total) by mouth 2 (two) times daily as needed.    Dispense:  16 tablet    Refill:  5

## 2023-09-18 ENCOUNTER — Telehealth: Payer: Self-pay | Admitting: Pharmacy Technician

## 2023-09-18 ENCOUNTER — Other Ambulatory Visit (HOSPITAL_COMMUNITY): Payer: Self-pay

## 2023-09-18 DIAGNOSIS — G43709 Chronic migraine without aura, not intractable, without status migrainosus: Secondary | ICD-10-CM

## 2023-09-18 MED ORDER — NURTEC 75 MG PO TBDP: ORAL_TABLET | ORAL | 5 refills | Status: DC

## 2023-09-18 NOTE — Telephone Encounter (Signed)
 Pharmacy Patient Advocate Encounter   See Qulipta  Encounter from 09/13/23 for additional info. Due to ins likely denying the PA for Qulipta , med changed to Nurtec.    Insurance verification completed.   The patient is insured through [facility].   Per test claim: PA required; PA submitted to above mentioned insurance via Latent Key/confirmation #/EOC [id number] Status is pending PA will still likely deny, due to the pt not failing at least 2 preferred injectables for migraine prevention.

## 2023-09-18 NOTE — Telephone Encounter (Signed)
 Could you please clarify whether Nurtec is being prescribed for preventive or acute therapy? The current prescription reads "take two times daily as needed," which does not align with the recommended dosing for either indication.

## 2023-09-18 NOTE — Telephone Encounter (Signed)
 Rimegepant Sulfate (NURTEC) 75 MG TBDP  5 ordered       Summary: Every other day as migraine prevention, as needed for acute rescue, Normal Start: 09/16/2025Ordered On: 09/16/2025Pharm[facility]: [id number] [place] [address]: Dx Associated: Taking: Long-term: Med Note:

## 2023-09-18 NOTE — Telephone Encounter (Signed)
 PA request has been Submitted for Nurtec. New Encounter has been created for follow up, due to med change. For additional info see Pharmacy Prior Auth telephone encounter from 09/18/23.

## 2023-09-18 NOTE — Addendum Note (Signed)
 Addended by: Zaron Zwiefelhofer on: 09/18/2023 02:56 PM   Modules accepted: Orders

## 2023-09-19 ENCOUNTER — Other Ambulatory Visit

## 2023-09-24 MED ORDER — AIMOVIG 70 MG/ML ~~LOC~~ SOAJ: 70.0000 mg | SUBCUTANEOUS | 11 refills | Status: AC

## 2023-09-24 NOTE — Telephone Encounter (Signed)
 Pharmacy Patient Advocate Encounter  Received notification from [facility] that Prior Authorization for Nurtec 75mg  ODT has been DENIED.  Full denial letter will be uploaded to the media tab. See denial reason below. Requires failure of two preferred injectable CGRP agents before coverage of Nurtec Tab 75mg  Odt.   PA #/Case ID/Reference #: [id number]

## 2023-09-24 NOTE — Telephone Encounter (Signed)
Meds ordered this encounter  Medications   Erenumab-aooe (AIMOVIG) 70 MG/ML SOAJ    Sig: Inject 70 mg into the skin every 30 (thirty) days.    Dispense:  1.12 mL    Refill:  11     

## 2023-09-25 ENCOUNTER — Telehealth: Payer: Self-pay | Admitting: Pharmacy Technician

## 2023-09-25 ENCOUNTER — Encounter (HOSPITAL_COMMUNITY): Payer: Self-pay

## 2023-09-25 ENCOUNTER — Other Ambulatory Visit (HOSPITAL_COMMUNITY): Payer: Self-pay

## 2023-09-25 NOTE — Telephone Encounter (Signed)
 Pharmacy Patient Advocate Encounter  Received notification from [employer] that Prior Authorization for Aimovig  70mg  has been APPROVED from 09/25/23 to 12/25/23. Ran test claim, Copay is $4. This test claim was processed through [facility]- copay amounts may vary at other pharmacies due to pharmacy/plan contracts, or as the patient moves through the different stages of their insurance plan.   PA #/Case ID/Reference #: [id number]

## 2023-09-25 NOTE — Telephone Encounter (Signed)
 PA request has been Submitted for Aimovig . New Encounter has been created for follow up, due to med change. For additional info see Pharmacy Prior Auth telephone encounter from 09/25/23.

## 2023-09-25 NOTE — Telephone Encounter (Signed)
 Previously prescribed different medication that the ins would not cover. See encounters from 09/18/23 and 09/13/23 for further details.  Pharmacy Patient Advocate Encounter   Received notification from Pt Calls Messages that prior authorization for Aimovig  70mg  is required/requested.   Insurance verification completed.   The patient is insured through [facility].   Per test claim: PA required; PA submitted to above mentioned insurance via Latent Key/confirmation #/EOC [id number] Status is pending

## 2023-09-28 ENCOUNTER — Ambulatory Visit: Admitting: Neurology

## 2023-09-28 DIAGNOSIS — R4182 Altered mental status, unspecified: Secondary | ICD-10-CM | POA: Diagnosis not present

## 2023-09-28 DIAGNOSIS — F32A Depression, unspecified: Secondary | ICD-10-CM

## 2023-09-28 DIAGNOSIS — G43709 Chronic migraine without aura, not intractable, without status migrainosus: Secondary | ICD-10-CM

## 2023-09-28 DIAGNOSIS — R413 Other amnesia: Secondary | ICD-10-CM

## 2023-10-01 ENCOUNTER — Inpatient Hospital Stay: Admission: RE | Admit: 2023-10-01 | Source: Ambulatory Visit

## 2023-10-04 NOTE — Procedures (Signed)
   HISTORY: 53 year old female history of encephalopathy complains of worsening memory loss  TECHNIQUE:  This is a routine 16 channel EEG recording with one channel devoted to a limited EKG recording.  It was performed during wakefulness, drowsiness and asleep.  Photic stimulation were performed as activating procedures.  There are minimum muscle and movement artifact noted.  Upon maximum arousal, posterior dominant waking rhythm consistent of rhythmic alpha range activity. Activities are symmetric over the bilateral posterior derivations and attenuated with eye opening.  Photic stimulation did not alter the tracing.  Hyperventilation was not performed due to history of COPD  During EEG recording, patient developed drowsiness and no deeper stage of sleep was achieved.  During EEG recording, there was no epileptiform discharge noted.  EKG demonstrate normal sinus rhythm.  CONCLUSION: This is a  normal awake EEG.  There is no electrodiagnostic evidence of epileptiform discharge.  Kataleyah Carducci, M.D. Ph.D.  [facility] [address] [place], [place] [address] Phone: [phone] Fax:      [phone]

## 2023-10-10 ENCOUNTER — Ambulatory Visit
Admission: RE | Admit: 2023-10-10 | Discharge: 2023-10-10 | Disposition: A | Discharge status: Home / Self Care | Source: Ambulatory Visit | Attending: Neurology | Admitting: Neurology

## 2023-10-10 DIAGNOSIS — G43709 Chronic migraine without aura, not intractable, without status migrainosus: Secondary | ICD-10-CM

## 2023-10-10 DIAGNOSIS — R413 Other amnesia: Secondary | ICD-10-CM | POA: Diagnosis not present

## 2023-10-10 DIAGNOSIS — F32A Depression, unspecified: Secondary | ICD-10-CM

## 2023-10-10 MED ORDER — GADOPICLENOL 0.5 MMOL/ML IV SOLN
7.5000 mL | Freq: Once | INTRAVENOUS | Status: AC | PRN
  Administered 2023-10-10: 7.5 mL via INTRAVENOUS

## 2023-10-29 ENCOUNTER — Other Ambulatory Visit: Payer: Self-pay | Admitting: Neurology

## 2023-11-16 ENCOUNTER — Ambulatory Visit: Admitting: Adult Health

## 2024-01-22 ENCOUNTER — Ambulatory Visit: Admitting: Family Medicine

## 2024-04-22 ENCOUNTER — Ambulatory Visit: Admitting: Neurology
